# Patient Record
Sex: Female | Born: 1970 | Hispanic: No | Marital: Married | Smoking: Never smoker
Health system: Southern US, Community
[De-identification: ages and names within clinical notes are randomized; demographics above are authoritative.]

## PROBLEM LIST (undated history)

## (undated) DIAGNOSIS — T7840XA Allergy, unspecified, initial encounter: Secondary | ICD-10-CM

## (undated) DIAGNOSIS — R011 Cardiac murmur, unspecified: Secondary | ICD-10-CM

## (undated) DIAGNOSIS — I639 Cerebral infarction, unspecified: Secondary | ICD-10-CM

## (undated) DIAGNOSIS — M810 Age-related osteoporosis without current pathological fracture: Secondary | ICD-10-CM

## (undated) DIAGNOSIS — M419 Scoliosis, unspecified: Secondary | ICD-10-CM

## (undated) DIAGNOSIS — J45909 Unspecified asthma, uncomplicated: Secondary | ICD-10-CM

## (undated) DIAGNOSIS — D649 Anemia, unspecified: Secondary | ICD-10-CM

## (undated) DIAGNOSIS — D573 Sickle-cell trait: Secondary | ICD-10-CM

## (undated) DIAGNOSIS — T8859XA Other complications of anesthesia, initial encounter: Secondary | ICD-10-CM

## (undated) DIAGNOSIS — R112 Nausea with vomiting, unspecified: Secondary | ICD-10-CM

## (undated) DIAGNOSIS — E162 Hypoglycemia, unspecified: Secondary | ICD-10-CM

## (undated) DIAGNOSIS — B029 Zoster without complications: Secondary | ICD-10-CM

## (undated) DIAGNOSIS — Z8489 Family history of other specified conditions: Secondary | ICD-10-CM

## (undated) DIAGNOSIS — Z9889 Other specified postprocedural states: Secondary | ICD-10-CM

## (undated) DIAGNOSIS — R519 Headache, unspecified: Secondary | ICD-10-CM

## (undated) DIAGNOSIS — Z9289 Personal history of other medical treatment: Secondary | ICD-10-CM

## (undated) DIAGNOSIS — T4145XA Adverse effect of unspecified anesthetic, initial encounter: Secondary | ICD-10-CM

## (undated) HISTORY — DX: Sickle-cell trait: D57.3

## (undated) HISTORY — DX: Zoster without complications: B02.9

## (undated) HISTORY — DX: Personal history of other medical treatment: Z92.89

## (undated) HISTORY — DX: Allergy, unspecified, initial encounter: T78.40XA

## (undated) HISTORY — PX: CYST REMOVAL HAND: SHX6279

## (undated) HISTORY — PX: FLEXIBLE SIGMOIDOSCOPY: SHX1649

## (undated) HISTORY — DX: Unspecified asthma, uncomplicated: J45.909

## (undated) HISTORY — DX: Scoliosis, unspecified: M41.9

## (undated) HISTORY — DX: Age-related osteoporosis without current pathological fracture: M81.0

## (undated) HISTORY — DX: Cerebral infarction, unspecified: I63.9

---
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
## 2001-11-30 HISTORY — PX: BREAST EXCISIONAL BIOPSY: SUR124

## 2002-11-30 HISTORY — PX: HERNIA REPAIR: SHX51

## 2002-11-30 HISTORY — PX: BREAST SURGERY: SHX581

## 2004-09-01 ENCOUNTER — Inpatient Hospital Stay: Payer: Self-pay | Admitting: Unknown Physician Specialty

## 2005-04-13 ENCOUNTER — Ambulatory Visit: Payer: Self-pay | Admitting: General Surgery

## 2005-04-25 ENCOUNTER — Emergency Department: Payer: Self-pay | Admitting: Emergency Medicine

## 2006-12-09 ENCOUNTER — Ambulatory Visit: Payer: Self-pay

## 2007-01-23 ENCOUNTER — Emergency Department: Payer: Self-pay | Admitting: General Practice

## 2008-05-12 ENCOUNTER — Emergency Department: Payer: Self-pay | Admitting: Emergency Medicine

## 2008-11-30 HISTORY — PX: ABDOMINAL HYSTERECTOMY: SHX81

## 2009-02-08 ENCOUNTER — Emergency Department: Payer: Self-pay | Admitting: Unknown Physician Specialty

## 2009-06-26 ENCOUNTER — Ambulatory Visit: Payer: Self-pay | Admitting: Unknown Physician Specialty

## 2009-07-02 ENCOUNTER — Ambulatory Visit: Payer: Self-pay | Admitting: Unknown Physician Specialty

## 2015-11-11 ENCOUNTER — Ambulatory Visit: Payer: Self-pay | Admitting: Family Medicine

## 2015-12-05 ENCOUNTER — Ambulatory Visit: Payer: Self-pay | Admitting: Family Medicine

## 2015-12-06 ENCOUNTER — Encounter: Payer: Self-pay | Admitting: Family Medicine

## 2015-12-06 ENCOUNTER — Ambulatory Visit
Admission: RE | Admit: 2015-12-06 | Discharge: 2015-12-06 | Disposition: A | Discharge status: Home / Self Care | Payer: BLUE CROSS/BLUE SHIELD | Source: Ambulatory Visit | Attending: Family Medicine | Admitting: Family Medicine

## 2015-12-06 ENCOUNTER — Ambulatory Visit (INDEPENDENT_AMBULATORY_CARE_PROVIDER_SITE_OTHER): Payer: BLUE CROSS/BLUE SHIELD | Admitting: Family Medicine

## 2015-12-06 VITALS — BP 118/70 | HR 94 | Temp 98.3°F | Resp 16 | Ht 62.0 in | Wt 108.6 lb

## 2015-12-06 DIAGNOSIS — M79644 Pain in right finger(s): Secondary | ICD-10-CM

## 2015-12-06 MED ORDER — NAPROXEN 500 MG PO TABS: 500.0000 mg | ORAL_TABLET | Freq: Two times a day (BID) | ORAL | Status: DC

## 2015-12-06 NOTE — Progress Notes (Signed)
Name: Angela Gordon   MRN: [id number]    DOB: 04/22/1971   Date:12/06/2015       Progress Note  Subjective  Chief Complaint  Chief Complaint  Patient presents with  . Establish Care    NP    HPI Comments: Seen after the injury at [facility] and received an X ray of right hand, was told her hand 'looked good.'  Hand Injury  Incident onset: 2 weeks ago. The incident occurred at home. The injury mechanism was a fall [place] forward and tried to catch herself, landed on her outstretched hand (thumb).). The pain is present in the right fingers (right thumb). The pain is at a severity of 5/10. The pain is moderate. The pain has been improving (Last night was 8/10) since the incident. The symptoms are aggravated by palpation and movement (limited ROM, cannot grip well or twist 2/2 pain). She has tried heat, ice, acetaminophen and NSAIDs for the symptoms. The treatment provided mild relief.    Pt. Is here to establish care. Previous PCP NP Orbie Hurst and also Dr. Linton Ham at [facility].  Past Medical History  Diagnosis Date  . Osteoporosis     Strong FHx of Osteoporosis.  Marland Kitchen Scoliosis     Seen Dr. Jefm Bryant  . Stroke ([place]) 1992    Toxemia of Pregnancy  . History of blood transfusion 1992    Blood Loss from Toxemia of Pregnancy  . Sickle-cell trait ([place])   . Asthma     Uses rescue inhaler occasionally.    Past Surgical History  Procedure Laterality Date  . Tonsillectomy  1978  . Cyst removal hand Right 2005-06  . Hernia repair  2004  . Abdominal hysterectomy  2010    Menopausal symptoms, takes HRT    Family History  Problem Relation Age of Onset  . Hypertension Mother   . Osteoporosis Mother   . Cervical cancer Mother     Hysterectomy in 90s.  . Sickle cell trait Father   . Sickle cell trait Sister     Social History   Social History  . Marital Status: Married    Spouse Name: N/A  . Number of Children: N/A  . Years of Education: N/A   Occupational History   . Not on file.   Social History Main Topics  . Smoking status: Never Smoker   . Smokeless tobacco: Never Used  . Alcohol Use: 1.2 oz/week    2 Glasses of wine per week     Comment: occasional  . Drug Use: No  . Sexual Activity: Yes   Other Topics Concern  . Not on file   Social History Narrative  . No narrative on file    Current outpatient prescriptions:  .  albuterol (PROVENTIL) (5 MG/ML) 0.5% nebulizer solution, Take 2.5 mg by nebulization as needed for wheezing or shortness of breath., Disp: , Rfl:   Allergies  Allergen Reactions  . Penicillins Rash  . 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  Musculoskeletal: Positive for myalgias and joint pain.   Objective  Filed Vitals:   12/06/15 0850  BP: 118/70  Pulse: 94  Temp: 98.3 F (36.8 C)  TempSrc: Oral  Resp: 16  Height: 5\' 2"  (1.575 m)  Weight: 108 lb 9.6 oz (49.261 kg)  SpO2: 94%    Physical Exam  Constitutional: She is well-developed, well-nourished, and in no distress.  Musculoskeletal:       Right hand: She exhibits tenderness. She exhibits no deformity, no laceration and no swelling.  Tenderness to palpation over the proximal IP joint, limited ROM,   Nursing note and vitals reviewed.   Assessment & Plan  1. Pain of right thumb Obtain x-ray to rule out fracture. If x-ray negative, we will consider referral to hand specialist. Started on high-dose NSAID therapy for pain relief - DG Hand Complete Right; Future - naproxen (NAPROSYN) 500 MG tablet; Take 1 tablet (500 mg total) by mouth 2 (two) times daily with a meal.  Dispense: 30 tablet; Refill: 0   Marvens Hollars [facility] 12/06/2015 9:18 AM

## 2015-12-18 ENCOUNTER — Other Ambulatory Visit: Payer: Self-pay | Admitting: Family Medicine

## 2016-01-07 ENCOUNTER — Ambulatory Visit (INDEPENDENT_AMBULATORY_CARE_PROVIDER_SITE_OTHER): Payer: BLUE CROSS/BLUE SHIELD | Admitting: Family Medicine

## 2016-01-07 ENCOUNTER — Encounter: Payer: Self-pay | Admitting: Family Medicine

## 2016-01-07 VITALS — BP 118/73 | HR 96 | Temp 98.6°F | Resp 20 | Ht 62.0 in | Wt 104.9 lb

## 2016-01-07 DIAGNOSIS — M810 Age-related osteoporosis without current pathological fracture: Secondary | ICD-10-CM

## 2016-01-07 DIAGNOSIS — Z0000 Encounter for general adult medical examination without abnormal findings: Secondary | ICD-10-CM

## 2016-01-07 NOTE — Progress Notes (Signed)
Name: Angela Gordon   MRN: [id number]    DOB: 07/28/1971   Date:01/07/2016       Progress Note  Subjective  Chief Complaint  Chief Complaint  Patient presents with  . Annual Exam    CPE    HPI  Pt. Is here for a Complete Physical Exam. She has her Gyn exam and screening at [facility]. She is doing well.  Past Medical History  Diagnosis Date  . Osteoporosis     Strong FHx of Osteoporosis.  Marland Kitchen Scoliosis     Seen Dr. Jefm Bryant  . Stroke ([place]) 1992    Toxemia of Pregnancy  . History of blood transfusion 1992    Blood Loss from Toxemia of Pregnancy  . Sickle-cell trait ([place])   . Asthma     Uses rescue inhaler occasionally.    Past Surgical History  Procedure Laterality Date  . Tonsillectomy  1978  . Cyst removal hand Right 2005-06  . Hernia repair  2004  . Abdominal hysterectomy  2010    Menopausal symptoms, takes HRT    Family History  Problem Relation Age of Onset  . Hypertension Mother   . Osteoporosis Mother   . Cervical cancer Mother     Hysterectomy in 76s.  . Sickle cell trait Father   . Sickle cell trait Sister     Social History   Social History  . Marital Status: Married    Spouse Name: N/A  . Number of Children: N/A  . Years of Education: N/A   Occupational History  . Not on file.   Social History Main Topics  . Smoking status: Never Smoker   . Smokeless tobacco: Never Used  . Alcohol Use: 1.2 oz/week    2 Glasses of wine per week     Comment: occasional  . Drug Use: No  . Sexual Activity: Yes   Other Topics Concern  . Not on file   Social History Narrative     Current outpatient prescriptions:  .  albuterol (PROVENTIL) (5 MG/ML) 0.5% nebulizer solution, Take 2.5 mg by nebulization as needed for wheezing or shortness of breath., Disp: , Rfl:  .  naproxen (NAPROSYN) 500 MG tablet, TAKE 1 TABLET (500 MG TOTAL) BY MOUTH 2 (TWO) TIMES DAILY WITH A MEAL., Disp: 30 tablet, Rfl: 0  Allergies  Allergen Reactions  . Penicillins Rash   . 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 Negative for fever, chills and weight loss.  HENT: Negative for congestion and sore throat.   Eyes: Negative for blurred vision and double vision.  Respiratory: Negative for cough and shortness of breath.   Cardiovascular: Negative for chest pain and leg swelling.  Gastrointestinal: Negative for heartburn, nausea, vomiting, abdominal pain, diarrhea, constipation, blood in stool and melena.  Genitourinary: Negative for dysuria and urgency.  Musculoskeletal: Positive for myalgias ('my whole body aches' Has been diagnosed with Osteoporosis based on DEXA scan 2-3 yrs ago, was not started on any therapy) and back pain. Negative for neck pain.  Skin: Negative for rash.  Neurological: Negative for headaches.  Psychiatric/Behavioral: Negative for depression. The patient is not nervous/anxious and does not have insomnia.     Objective  Filed Vitals:   01/07/16 0917  BP: 118/73  Pulse: 96  Temp: 98.6 F (37 C)  TempSrc: Oral  Resp: 20  Height: 5\' 2"  (1.575 m)  Weight: 104 lb 14.4 oz (47.582 kg)  SpO2: 98%    Physical Exam  Constitutional: She is oriented to person, place, and time and well-developed, well-nourished, and in no distress.  HENT:  Head: Normocephalic and atraumatic.  Mouth/Throat: No posterior oropharyngeal erythema.  Eyes: Pupils are equal, round, and reactive to light.  Neck: Normal range of motion.  Cardiovascular: Normal rate, regular rhythm and normal heart sounds.   Pulmonary/Chest: Effort normal and breath sounds normal. She has no wheezes.  Abdominal: Soft. Bowel sounds are normal. There is no tenderness.  Genitourinary:  deferred  Musculoskeletal: She exhibits no edema.  Neurological: She is alert and oriented to person, place, and time.  Nursing note and vitals reviewed.    Assessment & Plan  1. Encounter for annual  physical exam  Obtain baseline labs. Patient follows with  Gynecology for preventative screenings.  - CBC with Differential - Comprehensive Metabolic Panel (CMET) - Lipid Profile - TSH - Vitamin D (25 hydroxy)   Jamaia Brum [facility] 01/07/2016 9:31 AM

## 2016-01-08 LAB — CBC AND DIFFERENTIAL
HEMOGLOBIN: 13.9 g/dL (ref 12.0–16.0)
Platelets: 210 10*3/uL (ref 150–399)
WBC: 5.6 10^3/mL

## 2016-01-08 LAB — HEPATIC FUNCTION PANEL
ALT: 16 U/L (ref 7–35)
AST: 18 U/L (ref 13–35)
Alkaline Phosphatase: 47 U/L (ref 25–125)
Bilirubin, Total: 1.5 mg/dL

## 2016-01-08 LAB — BASIC METABOLIC PANEL
BUN: 11 mg/dL (ref 4–21)
Creatinine: 0.8 mg/dL (ref 0.5–1.1)
Glucose: 81 mg/dL
POTASSIUM: 4.1 mmol/L (ref 3.4–5.3)
SODIUM: 140 mmol/L (ref 137–147)

## 2016-01-08 LAB — LIPID PANEL
CHOLESTEROL: 143 mg/dL (ref 0–200)
HDL: 59 mg/dL (ref 35–70)
LDL CALC: 71 mg/dL
TRIGLYCERIDES: 64 mg/dL (ref 40–160)

## 2016-01-20 ENCOUNTER — Encounter: Payer: Self-pay | Admitting: Family Medicine

## 2016-01-20 ENCOUNTER — Ambulatory Visit (INDEPENDENT_AMBULATORY_CARE_PROVIDER_SITE_OTHER): Payer: BLUE CROSS/BLUE SHIELD | Admitting: Family Medicine

## 2016-01-20 VITALS — BP 102/78 | HR 99 | Temp 98.2°F | Resp 14 | Ht 62.0 in | Wt 106.0 lb

## 2016-01-20 DIAGNOSIS — M810 Age-related osteoporosis without current pathological fracture: Secondary | ICD-10-CM | POA: Diagnosis not present

## 2016-01-20 NOTE — Progress Notes (Signed)
Name: Angela Gordon   MRN: [id number]    DOB: 22-Mar-1971   Date:01/20/2016       Progress Note  Subjective  Chief Complaint  Chief Complaint  Patient presents with  . Osteoporosis    Discuss bone density    HPI  Pt. Is here to discuss the DEXA Scan obtained in March 2014, which showed Osteoporosis in her lumbar spine with a T-score of -3.2. She was told by Dr. Cristi Loron that she should not be on medication because she was 'too young' to receive any therapy.   Past Medical History  Diagnosis Date  . Osteoporosis     Strong FHx of Osteoporosis.  Marland Kitchen Scoliosis     Seen Dr. Jefm Bryant  . Stroke ([place]) 1992    Toxemia of Pregnancy  . History of blood transfusion 1992    Blood Loss from Toxemia of Pregnancy  . Sickle-cell trait ([place])   . Asthma     Uses rescue inhaler occasionally.    Past Surgical History  Procedure Laterality Date  . Tonsillectomy  1978  . Cyst removal hand Right 2005-06  . Hernia repair  2004  . Abdominal hysterectomy  2010    Menopausal symptoms, takes HRT    Family History  Problem Relation Age of Onset  . Hypertension Mother   . Osteoporosis Mother   . Cervical cancer Mother     Hysterectomy in 74s.  . Sickle cell trait Father   . Sickle cell trait Sister     Social History   Social History  . Marital Status: Married    Spouse Name: N/A  . Number of Children: N/A  . Years of Education: N/A   Occupational History  . Not on file.   Social History Main Topics  . Smoking status: Never Smoker   . Smokeless tobacco: Never Used  . Alcohol Use: 1.2 oz/week    2 Glasses of wine per week     Comment: occasional  . Drug Use: No  . Sexual Activity: Yes   Other Topics Concern  . Not on file   Social History Narrative     Current outpatient prescriptions:  .  albuterol (PROVENTIL) (5 MG/ML) 0.5% nebulizer solution, Take 2.5 mg by nebulization as needed for wheezing or shortness of breath., Disp: , Rfl:  .  naproxen (NAPROSYN) 500  MG tablet, TAKE 1 TABLET (500 MG TOTAL) BY MOUTH 2 (TWO) TIMES DAILY WITH A MEAL., Disp: 30 tablet, Rfl: 0  Allergies  Allergen Reactions  . Penicillins Rash  . Scallops [Shellfish Allergy] Anaphylaxis     Review of Systems  Musculoskeletal: Positive for back pain.    Objective  Filed Vitals:   01/20/16 1132  BP: 102/78  Pulse: 99  Temp: 98.2 F (36.8 C)  TempSrc: Oral  Resp: 14  Height: 5\' 2"  (1.575 m)  Weight: 106 lb (48.081 kg)  SpO2: 98%    Physical Exam  Constitutional: She is oriented to person, place, and time and well-developed, well-nourished, and in no distress.  Cardiovascular: Normal rate and regular rhythm.   Pulmonary/Chest: Effort normal and breath sounds normal.  Neurological: She is alert and oriented to person, place, and time.  Nursing note and vitals reviewed.      Assessment & Plan  1. Osteoporosis Based on patient's history and presentation, no contraindication exists for bisphosphonate therapy if she has osteoporosis. We'll repeat DEXA scan and follow-up. - DG Bone Density; Future   Arriyah Madej [facility] 01/20/2016 11:46 AM

## 2016-02-06 ENCOUNTER — Encounter: Payer: Self-pay | Admitting: Family Medicine

## 2016-02-06 ENCOUNTER — Telehealth: Payer: Self-pay

## 2016-02-06 ENCOUNTER — Other Ambulatory Visit: Payer: Self-pay

## 2016-02-06 ENCOUNTER — Other Ambulatory Visit: Payer: Self-pay | Admitting: Family Medicine

## 2016-02-06 MED ORDER — VITAMIN D (ERGOCALCIFEROL) 1.25 MG (50000 UNIT) PO CAPS: 50000.0000 [IU] | ORAL_CAPSULE | ORAL | Status: DC

## 2016-02-06 NOTE — Telephone Encounter (Signed)
Pt informed of recent lab results. Pt will return in 2 weeks to pick up lab slip for repeat CMP and return in 12 weeks for repeat Vit D lab. Also sent scrip for Vit D 50,000 units to pharmacy

## 2016-05-05 ENCOUNTER — Telehealth: Payer: Self-pay | Admitting: Family Medicine

## 2016-05-05 NOTE — Telephone Encounter (Signed)
Bone density scan or DEXA scan was ordered at the time of office visit on 01/20/2016. Please process the order so that patient can receive her scan as soon as possible. We will repeat  vitamin D levels at her follow-up appointment

## 2016-05-05 NOTE — Telephone Encounter (Signed)
According to your notes you wanted patient to return in 12weeks to redo her vitamin d levels. I scheduled appointment for her later this week. She never received a call about her done density report. Please return call. She also wanted to know if you wanted to refill her vitamin d prescription.

## 2016-05-06 NOTE — Telephone Encounter (Signed)
We'll review  DEXA scan results at the time of her office visit appointment.

## 2016-05-06 NOTE — Telephone Encounter (Signed)
Patient did the bone density and when I told her that the only one we had on file was from 2014 and said she would go by the place and try to pick up a copy of her results.

## 2016-05-07 ENCOUNTER — Ambulatory Visit (INDEPENDENT_AMBULATORY_CARE_PROVIDER_SITE_OTHER): Payer: BLUE CROSS/BLUE SHIELD | Admitting: Family Medicine

## 2016-05-07 ENCOUNTER — Encounter: Payer: Self-pay | Admitting: Family Medicine

## 2016-05-07 VITALS — BP 100/74 | HR 82 | Temp 98.5°F | Resp 17 | Ht 62.0 in | Wt 109.1 lb

## 2016-05-07 DIAGNOSIS — M810 Age-related osteoporosis without current pathological fracture: Secondary | ICD-10-CM

## 2016-05-07 DIAGNOSIS — D573 Sickle-cell trait: Secondary | ICD-10-CM

## 2016-05-07 DIAGNOSIS — E559 Vitamin D deficiency, unspecified: Secondary | ICD-10-CM | POA: Insufficient documentation

## 2016-05-07 MED ORDER — ALENDRONATE-CHOLECALCIFEROL 70-2800 MG-UNIT PO TABS: 1.0000 | ORAL_TABLET | ORAL | Status: DC

## 2016-05-07 NOTE — Progress Notes (Signed)
Name: Angela Gordon   MRN: [id number]    DOB: 1971-02-15   Date:05/07/2016       Progress Note  Subjective  Chief Complaint  Chief Complaint  Patient presents with  . Follow-up    Check vitamin D level    HPI  Osteoporosis: Pt. Has Osteoporosis of Lumbar spine, DEXA Scan obtained in March 2017 shows Z-score of -2.7 at the L-Spine consistent with Osteoporosis. In addition, her Z-score at the proximal left femur is -1.3, consistent with osteopenia. She reports chronic middle and low back pain, worse with activity and certain positions. Takes Aleve/Tylenol as needed for pain. Has hysterectomy w/ bilateral oophorectomy in September 2010 for strong family Hx of Uterine cancer.  Vitamin D Deficiency: Vitamin D deficiency, most recent level of 15 ng/mL in February 2017, she has taken Vitamin D 50,000 units q weekly. Will repeat Vitamin D levels.   Past Medical History  Diagnosis Date  . Osteoporosis     Strong FHx of Osteoporosis.  Marland Kitchen Scoliosis     Seen Dr. Jefm Bryant  . Stroke ([place]) 1992    Toxemia of Pregnancy  . History of blood transfusion 1992    Blood Loss from Toxemia of Pregnancy  . Sickle-cell trait ([place])   . Asthma     Uses rescue inhaler occasionally.    Past Surgical History  Procedure Laterality Date  . Tonsillectomy  1978  . Cyst removal hand Right 2005-06  . Hernia repair  2004  . Abdominal hysterectomy  2010    Menopausal symptoms, takes HRT    Family History  Problem Relation Age of Onset  . Hypertension Mother   . Osteoporosis Mother   . Cervical cancer Mother     Hysterectomy in 62s.  . Sickle cell trait Father   . Sickle cell trait Sister     Social History   Social History  . Marital Status: Married    Spouse Name: N/A  . Number of Children: N/A  . Years of Education: N/A   Occupational History  . Not on file.   Social History Main Topics  . Smoking status: Never Smoker   . Smokeless tobacco: Never Used  . Alcohol Use: 1.2 oz/week     2 Glasses of wine per week     Comment: occasional  . Drug Use: No  . Sexual Activity: Yes   Other Topics Concern  . Not on file   Social History Narrative     Current outpatient prescriptions:  .  albuterol (PROVENTIL) (5 MG/ML) 0.5% nebulizer solution, Take 2.5 mg by nebulization as needed for wheezing or shortness of breath., Disp: , Rfl:  .  alendronate-cholecalciferol (FOSAMAX PLUS D) 70-2800 MG-UNIT tablet, Take 1 tablet by mouth every 7 (seven) days. Take with a full glass of water on an empty stomach., Disp: 52 tablet, Rfl: 0 .  naproxen (NAPROSYN) 500 MG tablet, TAKE 1 TABLET (500 MG TOTAL) BY MOUTH 2 (TWO) TIMES DAILY WITH A MEAL., Disp: 30 tablet, Rfl: 0 .  Vitamin D, Ergocalciferol, (DRISDOL) 50000 units CAPS capsule, Take 1 capsule (50,000 Units total) by mouth every 7 (seven) days. (Patient not taking: Reported on 05/07/2016), Disp: 12 capsule, Rfl: 0  Allergies  Allergen Reactions  . Penicillins Rash  . 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  Cardiovascular: Negative for chest pain.  Gastrointestinal: Negative for heartburn, nausea, vomiting and abdominal pain.  Musculoskeletal: Positive for back pain  and joint pain.    Objective  Filed Vitals:   05/07/16 1007  BP: 100/74  Pulse: 82  Temp: 98.5 F (36.9 C)  TempSrc: Oral  Resp: 17  Height: 5\' 2"  (1.575 m)  Weight: 109 lb 1.6 oz (49.487 kg)  SpO2: 99%    Physical Exam  Constitutional: She is well-developed, well-nourished, and in no distress.  Cardiovascular: Normal rate, regular rhythm and normal heart sounds.   No murmur heard. Pulmonary/Chest: Effort normal and breath sounds normal. She has no wheezes.  Musculoskeletal:       Thoracic back: She exhibits tenderness and pain.       Lumbar back: She exhibits tenderness and pain.  Mild tenderness to palpation over the left lower spine level.  Nursing note and vitals  reviewed.    Assessment & Plan  1. Osteoporosis, post-menopausal We'll start on Fosamax plus vitamin D for 1 year. Educated on potential adverse effects, advised on taking medication first thing in the morning, sitting upright for 30 minutes. - alendronate-cholecalciferol (FOSAMAX PLUS D) 70-2800 MG-UNIT tablet; Take 1 tablet by mouth every 7 (seven) days. Take with a full glass of water on an empty stomach.  Dispense: 52 tablet; Refill: 0 - Comprehensive Metabolic Panel (CMET)  2. Vitamin D deficiency  - Vitamin D (25 hydroxy)  3. Sickle-cell trait ([place])   4. Hyperbilirubinemia Likely a component of RBC breakdown from sickle cell trait. Repeat bilirubin levels - Comprehensive Metabolic Panel (CMET)    Semaja Lymon [facility] 05/07/2016 10:34 AM

## 2016-05-08 ENCOUNTER — Encounter: Payer: Self-pay | Admitting: Family Medicine

## 2016-08-10 ENCOUNTER — Encounter: Payer: Self-pay | Admitting: Family Medicine

## 2016-08-10 ENCOUNTER — Ambulatory Visit (INDEPENDENT_AMBULATORY_CARE_PROVIDER_SITE_OTHER): Payer: BLUE CROSS/BLUE SHIELD | Admitting: Family Medicine

## 2016-08-10 VITALS — BP 112/68 | HR 96 | Temp 97.8°F | Resp 16 | Ht 62.0 in | Wt 111.1 lb

## 2016-08-10 DIAGNOSIS — E559 Vitamin D deficiency, unspecified: Secondary | ICD-10-CM

## 2016-08-10 DIAGNOSIS — M810 Age-related osteoporosis without current pathological fracture: Secondary | ICD-10-CM | POA: Diagnosis not present

## 2016-08-10 NOTE — Progress Notes (Signed)
Name: Angela Gordon   MRN: [id number]    DOB: [birth year]   Date:08/10/2016       Progress Note  Subjective  Chief Complaint  Chief Complaint  Patient presents with  . Follow-up    3 month vitamin D deficiency    HPI  Pt. Presents for vitamin D deficiency, last levels obtained in March 2017 showed her vitamin D level to be 15 ng/mL, she has previously taken Vitamin D 50,000 units prior to lab work. She has osteoporosis of the lumbar spine, osteopenia of femoral neck. She has been on Fosamax plus D, taking without any problems.  Past Medical History:  Diagnosis Date  . Asthma    Uses rescue inhaler occasionally.  Marland Kitchen History of blood transfusion 1992   Blood Loss from Toxemia of Pregnancy  . Osteoporosis    Strong FHx of Osteoporosis.  Marland Kitchen Scoliosis    Seen Dr. Jefm Gordon  . Sickle-cell trait ([place])   . Stroke [facility]) 1992   Toxemia of Pregnancy    Past Surgical History:  Procedure Laterality Date  . ABDOMINAL HYSTERECTOMY  2010   Menopausal symptoms, takes HRT  . CYST REMOVAL HAND Right 2005-06  . HERNIA REPAIR  2004  . TONSILLECTOMY  1978    Family History  Problem Relation Age of Onset  . Hypertension Mother   . Osteoporosis Mother   . Cervical cancer Mother     Hysterectomy in 54s.  . Sickle cell trait Father   . Sickle cell trait Sister     Social History   Social History  . Marital status: Married    Spouse name: N/A  . Number of children: N/A  . Years of education: N/A   Occupational History  . Not on file.   Social History Main Topics  . Smoking status: Never Smoker  . Smokeless tobacco: Never Used  . Alcohol use 1.2 oz/week    2 Glasses of wine per week     Comment: occasional  . Drug use: No  . Sexual activity: Yes   Other Topics Concern  . Not on file   Social History Narrative  . No narrative on file     Current Outpatient Prescriptions:  .  albuterol (PROVENTIL) (5 MG/ML) 0.5% nebulizer solution, Take 2.5 mg by nebulization as  needed for wheezing or shortness of breath., Disp: , Rfl:  .  alendronate-cholecalciferol (FOSAMAX PLUS D) 70-2800 MG-UNIT tablet, Take 1 tablet by mouth every 7 (seven) days. Take with a full glass of water on an empty stomach., Disp: 52 tablet, Rfl: 0 .  naproxen (NAPROSYN) 500 MG tablet, TAKE 1 TABLET (500 MG TOTAL) BY MOUTH 2 (TWO) TIMES DAILY WITH A MEAL., Disp: 30 tablet, Rfl: 0  Allergies  Allergen Reactions  . Penicillins Rash  . 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  Musculoskeletal: Positive for back pain and joint pain.     Objective  Vitals:   08/10/16 0939  BP: 112/68  Pulse: 96  Resp: 16  Temp: 97.8 F (36.6 C)  TempSrc: Oral  SpO2: (!) 62%  Weight: 111 lb 1.6 oz (50.4 kg)  Height: 5\' 2"  (1.575 m)    Physical Exam  Constitutional: She is oriented to person, place, and time and well-developed, well-nourished, and in no distress.  HENT:  Head: Normocephalic and atraumatic.  Cardiovascular: Normal rate, regular rhythm and normal heart sounds.   No murmur heard. Pulmonary/Chest: Effort normal  and breath sounds normal. She has no wheezes.  Musculoskeletal:       Lumbar back: Normal. She exhibits no tenderness, no pain and no spasm.  Neurological: She is alert and oriented to person, place, and time.  Skin: Skin is warm and dry.  Psychiatric: Mood, memory, affect and judgment normal.  Nursing note and vitals reviewed.   Assessment & Plan  1. Osteoporosis, post-menopausal DEXA scan report reviewed, patient on Fosamax Plus D  2. Vitamin D deficiency Recheck vitamin D levels, she is on 2800 units of vitamin D weekly with Fosamax. - Vitamin D (25 hydroxy)  Angela Gordon [facility] 08/10/2016 10:07 AM

## 2016-08-25 ENCOUNTER — Emergency Department: Payer: BLUE CROSS/BLUE SHIELD

## 2016-08-25 ENCOUNTER — Encounter: Payer: Self-pay | Admitting: Emergency Medicine

## 2016-08-25 ENCOUNTER — Emergency Department
Admission: EM | Admit: 2016-08-25 | Discharge: 2016-08-25 | Disposition: A | Discharge status: Home / Self Care | Payer: BLUE CROSS/BLUE SHIELD | Attending: Emergency Medicine | Admitting: Emergency Medicine

## 2016-08-25 DIAGNOSIS — Y999 Unspecified external cause status: Secondary | ICD-10-CM | POA: Insufficient documentation

## 2016-08-25 DIAGNOSIS — S90122A Contusion of left lesser toe(s) without damage to nail, initial encounter: Secondary | ICD-10-CM | POA: Insufficient documentation

## 2016-08-25 DIAGNOSIS — Y929 Unspecified place or not applicable: Secondary | ICD-10-CM | POA: Diagnosis not present

## 2016-08-25 DIAGNOSIS — Y939 Activity, unspecified: Secondary | ICD-10-CM | POA: Diagnosis not present

## 2016-08-25 DIAGNOSIS — J45909 Unspecified asthma, uncomplicated: Secondary | ICD-10-CM | POA: Insufficient documentation

## 2016-08-25 DIAGNOSIS — S9032XA Contusion of left foot, initial encounter: Secondary | ICD-10-CM

## 2016-08-25 DIAGNOSIS — W228XXA Striking against or struck by other objects, initial encounter: Secondary | ICD-10-CM | POA: Diagnosis not present

## 2016-08-25 DIAGNOSIS — S99922A Unspecified injury of left foot, initial encounter: Secondary | ICD-10-CM | POA: Diagnosis present

## 2016-08-25 MED ORDER — TRAMADOL HCL 50 MG PO TABS: 50.0000 mg | ORAL_TABLET | Freq: Four times a day (QID) | ORAL | 0 refills | Status: DC | PRN

## 2016-08-25 MED ORDER — NAPROXEN 500 MG PO TABS: 500.0000 mg | ORAL_TABLET | Freq: Two times a day (BID) | ORAL | Status: DC

## 2016-08-25 NOTE — ED Provider Notes (Signed)
[facility] Emergency Department Provider Note   ____________________________________________   None    (approximate)  I have reviewed the triage vital signs and the nursing notes.  HISTORY  Chief Complaint Toe Pain    HPI Angela Gordon is a 45 y.o. female patient complaining of pain and edema to the left fifth toe 4 days. Patient states she was hit a chair while she was trying to help her husband. Patient stated pain is increased and she is unable to bear weight secondary to the pain. No palliative measures taken for this complaint.Patient rates the pain as 8/10. Patient described a pain as "sharp".   Past Medical History:  Diagnosis Date  . Asthma    Uses rescue inhaler occasionally.  Marland Kitchen History of blood transfusion 1992   Blood Loss from Toxemia of Pregnancy  . Osteoporosis    Strong FHx of Osteoporosis.  Marland Kitchen Scoliosis    Seen Dr. Jefm Bryant  . Sickle-cell trait ([place])   . Stroke [facility]) 1992   Toxemia of Pregnancy    Patient Active Problem List   Diagnosis Date Noted  . Vitamin D deficiency 05/07/2016  . Sickle-cell trait ([place]) 05/07/2016  . Hyperbilirubinemia 05/07/2016  . Encounter for annual physical exam 01/07/2016  . 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 Pain of right thumb 12/06/2015    Past Surgical History:  Procedure Laterality Date  . ABDOMINAL HYSTERECTOMY  2010   Menopausal symptoms, takes HRT  . CYST REMOVAL HAND Right 2005-06  . HERNIA REPAIR  2004  . TONSILLECTOMY  1978    Prior to Admission medications   Medication Sig Start Date End Date Taking? Authorizing Provider  albuterol (PROVENTIL) (5 MG/ML) 0.5% nebulizer solution Take 2.5 mg by nebulization as needed for wheezing or shortness of breath.    Historical Provider, MD  alendronate-cholecalciferol (FOSAMAX PLUS D) 70-2800 MG-UNIT tablet Take 1 tablet by mouth every 7 (seven) days. Take with a full glass of water on an empty stomach. 05/07/16   Roselee Nova, MD  naproxen (NAPROSYN) 500 MG tablet TAKE 1 TABLET (500 MG TOTAL) BY MOUTH 2 (TWO) TIMES DAILY WITH A MEAL. 12/19/15   Roselee Nova, MD  naproxen (NAPROSYN) 500 MG tablet Take 1 tablet (500 mg total) by mouth 2 (two) times daily with a meal. 08/25/16   Sable Feil, [name]  traMADol (ULTRAM) 50 MG tablet Take 1 tablet (50 mg total) by mouth every 6 (six) hours as needed for moderate pain. 08/25/16   Sable Feil, [name]    Allergies Penicillins and Scallops [shellfish allergy]  Family History  Problem Relation Age of Onset  . Hypertension Mother   . Osteoporosis Mother   . Cervical cancer Mother     Hysterectomy in 32s.  . Sickle cell trait Father   . Sickle cell trait Sister     Social History Social History  Substance Use Topics  . Smoking status: Never Smoker  . Smokeless tobacco: Never Used  . Alcohol use 1.2 oz/week    2 Glasses of wine per week     Comment: occasional    Review of Systems Constitutional: No fever/chills Eyes: No visual changes. ENT: No sore throat. Cardiovascular: Denies chest pain. Respiratory: Denies shortness of breath. Gastrointestinal: No abdominal pain.  No nausea, no vomiting.  No diarrhea.  No constipation. Genitourinary: Negative for dysuria. Musculoskeletal: Negative for back pain. Skin: Negative for rash. Neurological: Negative for headaches, focal weakness or numbness. Hematological/Lymphatic:Sickle cell trait  and vitamin D deficiency. Allergic/Immunilogical: See medication list ________________________________________   PHYSICAL EXAM:  VITAL SIGNS: ED Triage Vitals [08/25/16 0836]  Enc Vitals Group     BP 109/73     Pulse Rate 83     Resp 20     Temp 98.1 F (36.7 C)     Temp Source Oral     SpO2 100 %     Weight 111 lb (50.3 kg)     Height 5\' 2"  (1.575 m)     Head Circumference      Peak Flow      Pain Score      Pain Loc      Pain Edu?      Excl. in [place]?     Constitutional: Alert and oriented. Well  appearing and in no acute distress. Eyes: Conjunctivae are normal. PERRL. EOMI. Head: Atraumatic. Nose: No congestion/rhinnorhea. Mouth/Throat: Mucous membranes are moist.  Oropharynx non-erythematous. Neck: No stridor.  No cervical spine tenderness to palpation. Hematological/Lymphatic/Immunilogical: No cervical lymphadenopathy. Cardiovascular: Normal rate, regular rhythm. Grossly normal heart sounds.  Good peripheral circulation. Respiratory: Normal respiratory effort.  No retractions. Lungs CTAB. Gastrointestinal: Soft and nontender. No distention. No abdominal bruits. No CVA tenderness. Musculoskeletal: No obvious deformity to the fifth digit left foot. There is moderate edema and ecchymosis. Patient has moderate guarding palpation of the metatarsal head.  Neurologic:  Normal speech and language. No gross focal neurologic deficits are appreciated. No gait instability. Skin:  Skin is warm, dry and intact. No rash noted. Psychiatric: Mood and affect are normal. Speech and behavior are normal.  ____________________________________________   LABS (all labs ordered are listed, but only abnormal results are displayed)  Labs Reviewed - No data to display ____________________________________________  EKG   ____________________________________________  RADIOLOGY  No acute findings on x-ray. Soft tissue edema is apparent. ____________________________________________   PROCEDURES  Procedure(s) performed: None  Procedures  Critical Care performed: No  ____________________________________________   INITIAL IMPRESSION / ASSESSMENT AND PLAN / ED COURSE  Pertinent labs & imaging results that were available during my care of the patient were reviewed by me and considered in my medical decision making (see chart for details).  Left foot contusion. Discussed x-ray finding with patient. Patient given discharge care instructions. Patient given open shoe for comfort. Patient given a  prescription for naproxen and tramadol. Patient advised to follow-up with family doctor condition persists.  Clinical Course     ____________________________________________   FINAL CLINICAL IMPRESSION(S) / ED DIAGNOSES  Final diagnoses:  Foot contusion, left, initial encounter      NEW MEDICATIONS STARTED DURING THIS VISIT:  New Prescriptions   NAPROXEN (NAPROSYN) 500 MG TABLET    Take 1 tablet (500 mg total) by mouth 2 (two) times daily with a meal.   TRAMADOL (ULTRAM) 50 MG TABLET    Take 1 tablet (50 mg total) by mouth every 6 (six) hours as needed for moderate pain.     Note:  This document was prepared using Dragon voice recognition software and may include unintentional dictation errors.    Sable Feil, [name] 08/25/16 [id number]    Harvest Dark, MD 08/25/16 [id number]

## 2016-08-25 NOTE — ED Triage Notes (Signed)
States she hit her left 5th toe on chair on Friday  conts to have pain unable to bear full wt

## 2016-09-02 ENCOUNTER — Encounter: Payer: Self-pay | Admitting: Family Medicine

## 2016-09-02 ENCOUNTER — Ambulatory Visit (INDEPENDENT_AMBULATORY_CARE_PROVIDER_SITE_OTHER): Payer: BLUE CROSS/BLUE SHIELD | Admitting: Family Medicine

## 2016-09-02 VITALS — BP 124/76 | HR 75 | Temp 98.0°F | Resp 16 | Ht 62.0 in | Wt 112.2 lb

## 2016-09-02 DIAGNOSIS — S90122D Contusion of left lesser toe(s) without damage to nail, subsequent encounter: Secondary | ICD-10-CM

## 2016-09-02 NOTE — Progress Notes (Signed)
Name: Angela Gordon   MRN: [id number]    DOB: [birth year]   Date:09/02/2016       Progress Note  Subjective  Chief Complaint  Chief Complaint  Patient presents with  . Toe Injury    seen in ER 9/23, hit small left toe on chair, painful    HPI  Pt. Presents for evaluation of injury to left 5th toe, her toe hit a wooden chair on the evening of Saturday, September 23rd,  started to swell and was seen in the ER. X rays showed no fracture but soft tissue swelling. She was prescribed NSAIDS, put in a special shoe and asked to follow up. She has been wearing the shoe and swelling has gone down but still is in a lot of pain especially with walking and weight bearing.    Past Medical History:  Diagnosis Date  . Asthma    Uses rescue inhaler occasionally.  Marland Kitchen History of blood transfusion 1992   Blood Loss from Toxemia of Pregnancy  . Osteoporosis    Strong FHx of Osteoporosis.  Marland Kitchen Scoliosis    Seen Dr. Jefm Bryant  . Sickle-cell trait ([place])   . Stroke [facility]) 1992   Toxemia of Pregnancy    Past Surgical History:  Procedure Laterality Date  . ABDOMINAL HYSTERECTOMY  2010   Menopausal symptoms, takes HRT  . CYST REMOVAL HAND Right 2005-06  . HERNIA REPAIR  2004  . TONSILLECTOMY  1978    Family History  Problem Relation Age of Onset  . Hypertension Mother   . Osteoporosis Mother   . Cervical cancer Mother     Hysterectomy in 33s.  . Sickle cell trait Father   . Sickle cell trait Sister     Social History   Social History  . Marital status: Married    Spouse name: N/A  . Number of children: N/A  . Years of education: N/A   Occupational History  . Not on file.   Social History Main Topics  . Smoking status: Never Smoker  . Smokeless tobacco: Never Used  . Alcohol use 1.2 oz/week    2 Glasses of wine per week     Comment: occasional  . Drug use: No  . Sexual activity: Yes   Other Topics Concern  . Not on file   Social History Narrative  . No narrative on file      Current Outpatient Prescriptions:  .  albuterol (PROVENTIL) (5 MG/ML) 0.5% nebulizer solution, Take 2.5 mg by nebulization as needed for wheezing or shortness of breath., Disp: , Rfl:  .  alendronate-cholecalciferol (FOSAMAX PLUS D) 70-2800 MG-UNIT tablet, Take 1 tablet by mouth every 7 (seven) days. Take with a full glass of water on an empty stomach., Disp: 52 tablet, Rfl: 0 .  naproxen (NAPROSYN) 500 MG tablet, TAKE 1 TABLET (500 MG TOTAL) BY MOUTH 2 (TWO) TIMES DAILY WITH A MEAL., Disp: 30 tablet, Rfl: 0 .  traMADol (ULTRAM) 50 MG tablet, Take 1 tablet (50 mg total) by mouth every 6 (six) hours as needed for moderate pain., Disp: 12 tablet, Rfl: 0  Allergies  Allergen Reactions  . Penicillins Rash  . Scallops [Shellfish Allergy] Anaphylaxis     Review of Systems  Constitutional: Negative for chills, fever and malaise/fatigue.  Musculoskeletal: Positive for joint pain.    Objective  Vitals:   09/02/16 0830  BP: 124/76  Pulse: 75  Resp: 16  Temp: 98 F (36.7 C)  TempSrc: Oral  SpO2: 98%  Weight: 112 lb 3.2 oz (50.9 kg)  Height: 5\' 2"  (1.575 m)    Physical Exam  Constitutional: She is oriented to person, place, and time and well-developed, well-nourished, and in no distress.  HENT:  Head: Normocephalic and atraumatic.  Musculoskeletal:  Left 5th toe mild swelling, tender to palpation laterally and distally, somewhat limited ROM  Neurological: She is alert and oriented to person, place, and time.  Psychiatric: Mood, memory, affect and judgment normal.  Nursing note and vitals reviewed.    Assessment & Plan  1. Contusion of fifth toe, left, subsequent encounter Reviewed x-rays, no fracture but soft tissue swelling. Pain is improving. Advised to continue to use NSAID as needed, apply ice to relieve the soft tissue swelling.   Marquize Seib [facility] 09/02/2016 8:38 AM

## 2016-10-14 ENCOUNTER — Encounter: Payer: Self-pay | Admitting: *Deleted

## 2016-10-14 ENCOUNTER — Emergency Department
Admission: EM | Admit: 2016-10-14 | Discharge: 2016-10-14 | Disposition: A | Discharge status: Home / Self Care | Payer: BLUE CROSS/BLUE SHIELD | Attending: Emergency Medicine | Admitting: Emergency Medicine

## 2016-10-14 DIAGNOSIS — J45909 Unspecified asthma, uncomplicated: Secondary | ICD-10-CM | POA: Diagnosis not present

## 2016-10-14 DIAGNOSIS — Z79899 Other long term (current) drug therapy: Secondary | ICD-10-CM | POA: Diagnosis not present

## 2016-10-14 DIAGNOSIS — M7711 Lateral epicondylitis, right elbow: Secondary | ICD-10-CM | POA: Insufficient documentation

## 2016-10-14 DIAGNOSIS — Z791 Long term (current) use of non-steroidal anti-inflammatories (NSAID): Secondary | ICD-10-CM | POA: Insufficient documentation

## 2016-10-14 DIAGNOSIS — M25521 Pain in right elbow: Secondary | ICD-10-CM | POA: Diagnosis present

## 2016-10-14 MED ORDER — MELOXICAM 15 MG PO TABS: 15.0000 mg | ORAL_TABLET | Freq: Every day | ORAL | 0 refills | Status: DC

## 2016-10-14 NOTE — ED Triage Notes (Signed)
Pt has pain in right elbow and forearm.  No known injury.  Pt states painful to bend arm.

## 2016-10-14 NOTE — ED Provider Notes (Signed)
[facility] Emergency Department Provider Note  ____________________________________________  Time seen: Approximately 6:14 PM  I have reviewed the triage vital signs and the nursing notes.   HISTORY  Chief Complaint Arm Pain    HPI Angela Gordon is a 45 y.o. female , NAD, presents to the emergency department with several day history of right elbow pain. Patient states she has had pain about the right lateral elbow over the last couple of days seems to be worsening. States the pain can shoot into the right forearm and cause tingling in the tips of her fingers. Denies any injury or trauma. Has not noted any redness, swelling, bruising, open wounds or lacerations. Has not had any headaches, visual changes, chest pain, shortness breath. States she does do a lot of typing at work but no specific repetitive grabbing stretching or twisting motions. Has not completed any supportive care at home.   Past Medical History:  Diagnosis Date  . Asthma    Uses rescue inhaler occasionally.  Marland Kitchen History of blood transfusion 1992   Blood Loss from Toxemia of Pregnancy  . Osteoporosis    Strong FHx of Osteoporosis.  Marland Kitchen Scoliosis    Seen Dr. Jefm Bryant  . Sickle-cell trait ([place])   . Stroke [facility]) 1992   Toxemia of Pregnancy    Patient Active Problem List   Diagnosis Date Noted  . Vitamin D deficiency 05/07/2016  . Sickle-cell trait ([place]) 05/07/2016  . Hyperbilirubinemia 05/07/2016  . Encounter for annual physical exam 01/07/2016  . 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 Pain of right thumb 12/06/2015    Past Surgical History:  Procedure Laterality Date  . ABDOMINAL HYSTERECTOMY  2010   Menopausal symptoms, takes HRT  . CYST REMOVAL HAND Right 2005-06  . HERNIA REPAIR  2004  . TONSILLECTOMY  1978    Prior to Admission medications   Medication Sig Start Date End Date Taking? Authorizing Provider  albuterol (PROVENTIL) (5 MG/ML) 0.5% nebulizer solution Take  2.5 mg by nebulization as needed for wheezing or shortness of breath.    Historical Provider, MD  alendronate-cholecalciferol (FOSAMAX PLUS D) 70-2800 MG-UNIT tablet Take 1 tablet by mouth every 7 (seven) days. Take with a full glass of water on an empty stomach. 05/07/16   Roselee Nova, MD  meloxicam (MOBIC) 15 MG tablet Take 1 tablet (15 mg total) by mouth daily. 10/14/16   Blaine Guiffre L Korbin Mapps, [name]  naproxen (NAPROSYN) 500 MG tablet TAKE 1 TABLET (500 MG TOTAL) BY MOUTH 2 (TWO) TIMES DAILY WITH A MEAL. 12/19/15   Roselee Nova, MD  traMADol (ULTRAM) 50 MG tablet Take 1 tablet (50 mg total) by mouth every 6 (six) hours as needed for moderate pain. 08/25/16   Sable Feil, [name]    Allergies Penicillins and Scallops [shellfish allergy]  Family History  Problem Relation Age of Onset  . Hypertension Mother   . Osteoporosis Mother   . Cervical cancer Mother     Hysterectomy in 104s.  . Sickle cell trait Father   . Sickle cell trait Sister     Social History Social History  Substance Use Topics  . Smoking status: Never Smoker  . Smokeless tobacco: Never Used  . Alcohol use No     Comment: occasional     Review of Systems  Constitutional: No fatigue Eyes: No visual changes.  Cardiovascular: No chest pain. Respiratory: No shortness of breath. Musculoskeletal: Positive right elbow pain.  Skin: Negative for rash, redness, swelling,  bruising, open wounds or lacerations. Neurological: Positive tingling right fingertips that is intermittent. Negative for headaches, focal weakness or numbness. 10-point ROS otherwise negative.  ____________________________________________   PHYSICAL EXAM:  VITAL SIGNS: ED Triage Vitals  Enc Vitals Group     BP 10/14/16 1805 120/89     Pulse Rate 10/14/16 1805 66     Resp 10/14/16 1805 20     Temp 10/14/16 1805 98.6 F (37 C)     Temp Source 10/14/16 1805 Oral     SpO2 10/14/16 1805 99 %     Weight 10/14/16 1805 120 lb (54.4 kg)     Height  10/14/16 1805 5\' 2"  (1.575 m)     Head Circumference --      Peak Flow --      Pain Score 10/14/16 1806 6     Pain Loc --      Pain Edu? --      Excl. in [place]? --      Constitutional: Alert and oriented. Well appearing and in no acute distress. Eyes: Conjunctivae are normal. PERRL Head: Atraumatic. Cardiovascular: Good peripheral circulation with 2+ pulses in the right upper extremity.  Respiratory: Normal respiratory effort without tachypnea or retractions. Musculoskeletal: Tenderness to palpation about the right lateral epicondyles without bony abnormality or crepitus. Full range motion of the right upper extremity without difficulty. Some pain with full flexion of the right elbow about the lateral epicondyle. Grip strength in bilateral upper extremities is 5 out of 5 and equal. Neurologic:  Normal speech and language. No gross focal neurologic deficits are appreciated. Sensation to light touch grossly intact about the right upper extremity. Skin:  Skin is warm, dry and intact. No rash, redness, swelling, abnormal warmth, bruising, skin sores, open wounds noted. Psychiatric: Mood and affect are normal. Speech and behavior are normal. Patient exhibits appropriate insight and judgement.   ____________________________________________   LABS  None ____________________________________________  EKG  None ____________________________________________  RADIOLOGY  None ____________________________________________    PROCEDURES  Procedure(s) performed: None   Procedures   Medications - No data to display   ____________________________________________   INITIAL IMPRESSION / ASSESSMENT AND PLAN / ED COURSE  Pertinent labs & imaging results that were available during my care of the patient were reviewed by me and considered in my medical decision making (see chart for details).  Clinical Course     Patient's diagnosis is consistent with Right lateral epicondylitis.  Patient will be discharged home with prescriptions for meloxicam to take as directed. Patient is to follow up with Dr. Roland Rack in orthopedics if symptoms persist past this treatment course. Patient is given ED precautions to return to the ED for any worsening or new symptoms.    ____________________________________________  FINAL CLINICAL IMPRESSION(S) / ED DIAGNOSES  Final diagnoses:  Lateral epicondylitis of right elbow      NEW MEDICATIONS STARTED DURING THIS VISIT:  Discharge Medication List as of 10/14/2016  6:28 PM    START taking these medications   Details  meloxicam (MOBIC) 15 MG tablet Take 1 tablet (15 mg total) by mouth daily., Starting Wed 10/14/2016, [place], [name] 10/14/16 2055    Daymon Larsen, MD 10/14/16 2103

## 2016-10-14 NOTE — ED Notes (Signed)
Pt verbalized understanding of discharge instructions. NAD at this time. 

## 2016-10-26 MED ORDER — LIDOCAINE HCL (PF) 1 % IJ SOLN
INTRAMUSCULAR | Status: AC
  Filled 2016-10-26: qty 5

## 2016-10-29 DIAGNOSIS — M7710 Lateral epicondylitis, unspecified elbow: Secondary | ICD-10-CM | POA: Insufficient documentation

## 2016-12-01 ENCOUNTER — Telehealth: Payer: Self-pay | Admitting: Emergency Medicine

## 2016-12-01 ENCOUNTER — Other Ambulatory Visit: Payer: Self-pay | Admitting: Emergency Medicine

## 2016-12-01 MED ORDER — OSELTAMIVIR PHOSPHATE 75 MG PO CAPS: 75.0000 mg | ORAL_CAPSULE | Freq: Two times a day (BID) | ORAL | 0 refills | Status: DC

## 2016-12-01 NOTE — Progress Notes (Unsigned)
amif

## 2016-12-01 NOTE — Telephone Encounter (Signed)
Patient called and stated that her daughter tested positive for the flu on Sunday, now she has the symptoms.  Dr. Ancil Boozer was notified. Per Dr. Ancil Boozer call in Tamiflu 75 mg twice a day for 5 days. Script called to [facility]

## 2017-07-13 ENCOUNTER — Emergency Department
Admission: EM | Admit: 2017-07-13 | Discharge: 2017-07-13 | Disposition: A | Discharge status: Home / Self Care | Payer: BLUE CROSS/BLUE SHIELD | Attending: Emergency Medicine | Admitting: Emergency Medicine

## 2017-07-13 ENCOUNTER — Emergency Department: Payer: BLUE CROSS/BLUE SHIELD

## 2017-07-13 ENCOUNTER — Encounter: Payer: Self-pay | Admitting: Emergency Medicine

## 2017-07-13 DIAGNOSIS — Z79899 Other long term (current) drug therapy: Secondary | ICD-10-CM | POA: Diagnosis not present

## 2017-07-13 DIAGNOSIS — J45909 Unspecified asthma, uncomplicated: Secondary | ICD-10-CM | POA: Diagnosis not present

## 2017-07-13 DIAGNOSIS — M7551 Bursitis of right shoulder: Secondary | ICD-10-CM | POA: Diagnosis not present

## 2017-07-13 DIAGNOSIS — M25511 Pain in right shoulder: Secondary | ICD-10-CM | POA: Diagnosis present

## 2017-07-13 MED ORDER — TRAMADOL HCL 50 MG PO TABS: 50.0000 mg | ORAL_TABLET | Freq: Four times a day (QID) | ORAL | 0 refills | Status: DC | PRN

## 2017-07-13 MED ORDER — NAPROXEN 500 MG PO TABS: 500.0000 mg | ORAL_TABLET | Freq: Two times a day (BID) | ORAL | Status: DC

## 2017-07-13 NOTE — ED Triage Notes (Signed)
C/O right shoulder pain since Saturday.  Has been helping one of her children move to college. Pain worse when reaching behind.

## 2017-07-13 NOTE — Discharge Instructions (Signed)
Arm sling for 2-3 days as needed °

## 2017-07-13 NOTE — ED Provider Notes (Signed)
[facility] Emergency Department Provider Note   ____________________________________________   First MD Initiated Contact with Patient 07/13/17 1217     (approximate)  I have reviewed the triage vital signs and the nursing notes.   HISTORY  Chief Complaint Shoulder Pain     HPI Angela Gordon is a 46 y.o. female patient complaining of right shoulder pain for 3 days. Patient states  helping her child to relocate for college. Patient states she performed repetitive overhead reaching and lifting. Patient states pain increases with abduction overhead reaching. Patient rates the pain as 8/10. Patient described a pain is "acute aching". No palliative measures for complaint. Past Medical History:  Diagnosis Date  . Asthma    Uses rescue inhaler occasionally.  Marland Kitchen History of blood transfusion 1992   Blood Loss from Toxemia of Pregnancy  . Osteoporosis    Strong FHx of Osteoporosis.  Marland Kitchen Scoliosis    Seen Dr. Jefm Bryant  . Sickle-cell trait ([place])   . Stroke [facility]) 1992   Toxemia of Pregnancy    Patient Active Problem List   Diagnosis Date Noted  . Vitamin D deficiency 05/07/2016  . Sickle-cell trait ([place]) 05/07/2016  . Hyperbilirubinemia 05/07/2016  . Encounter for annual physical exam 01/07/2016  . 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 Pain of right thumb 12/06/2015    Past Surgical History:  Procedure Laterality Date  . ABDOMINAL HYSTERECTOMY  2010   Menopausal symptoms, takes HRT  . CYST REMOVAL HAND Right 2005-06  . HERNIA REPAIR  2004  . TONSILLECTOMY  1978    Prior to Admission medications   Medication Sig Start Date End Date Taking? Authorizing Provider  albuterol (PROVENTIL) (5 MG/ML) 0.5% nebulizer solution Take 2.5 mg by nebulization as needed for wheezing or shortness of breath.    [provider]  alendronate-cholecalciferol (FOSAMAX PLUS D) 70-2800 MG-UNIT tablet Take 1 tablet by mouth every 7 (seven) days. Take with  a full glass of water on an empty stomach. 05/07/16   Roselee Nova, MD  meloxicam (MOBIC) 15 MG tablet Take 1 tablet (15 mg total) by mouth daily. 10/14/16   Hagler, Jami L, [name]  naproxen (NAPROSYN) 500 MG tablet TAKE 1 TABLET (500 MG TOTAL) BY MOUTH 2 (TWO) TIMES DAILY WITH A MEAL. 12/19/15   Roselee Nova, MD  naproxen (NAPROSYN) 500 MG tablet Take 1 tablet (500 mg total) by mouth 2 (two) times daily with a meal. 07/13/17   Sable Feil, [name]  oseltamivir (TAMIFLU) 75 MG capsule Take 1 capsule (75 mg total) by mouth 2 (two) times daily. 12/01/16   Steele Sizer, MD  traMADol (ULTRAM) 50 MG tablet Take 1 tablet (50 mg total) by mouth every 6 (six) hours as needed for moderate pain. 08/25/16   Sable Feil, [name]  traMADol (ULTRAM) 50 MG tablet Take 1 tablet (50 mg total) by mouth every 6 (six) hours as needed for moderate pain. 07/13/17   Sable Feil, [name]    Allergies   Family History  Problem Relation Age of Onset  . Hypertension Mother   . Osteoporosis Mother   . Cervical cancer Mother        Hysterectomy in 17s.  . Sickle cell trait Father   . Sickle cell trait Sister     Social History Social History  Substance Use Topics  . Smoking status: Never Smoker  . Smokeless tobacco: Never Used  . Alcohol use No     Comment: occasional  Review of Systems  Constitutional: No fever/chills Eyes: No visual changes. ENT: No sore throat. Cardiovascular: Denies chest pain. Respiratory: Denies shortness of breath. Gastrointestinal: No abdominal pain.  No nausea, no vomiting.  No diarrhea.  No constipation. Genitourinary: Negative for dysuria. Musculoskeletal: Right shoulder pain Skin: Negative for rash. Neurological: Negative for headaches, focal weakness or numbness.   ____________________________________________   PHYSICAL EXAM:  VITAL SIGNS: ED Triage Vitals  Enc Vitals Group     BP 07/13/17 1146 (!) 115/97     Pulse Rate 07/13/17 1146 80     Resp  07/13/17 1151 16     Temp 07/13/17 1146 98.9 F (37.2 C)     Temp Source 07/13/17 1146 Oral     SpO2 07/13/17 1146 100 %     Weight 07/13/17 1151 120 lb (54.4 kg)     Height 07/13/17 1151 5\' 2"  (1.575 m)     Head Circumference --      Peak Flow --      Pain Score 07/13/17 1146 7     Pain Loc --      Pain Edu? --      Excl. in [place]? --     Constitutional: Alert and oriented. Well appearing and in no acute distress. Cardiovascular: Normal rate, regular rhythm. Grossly normal heart sounds.  Good peripheral circulation. Respiratory: Normal respiratory effort.  No retractions. Lungs CTAB. Musculoskeletal: No obvious deformity to the right shoulder. Patient is moderate guarding palpation of the humeral head. Patient decreased range of motion with abduction and opiate reaching. Strength against resistance 3/5.  Neurologic:  Normal speech and language. No gross focal neurologic deficits are appreciated. No gait instability. Skin:  Skin is warm, dry and intact. No rash noted. Psychiatric: Mood and affect are normal. Speech and behavior are normal.  ____________________________________________   LABS (all labs ordered are listed, but only abnormal results are displayed)  Labs Reviewed - No data to display ____________________________________________  EKG   ____________________________________________  RADIOLOGY  Dg Shoulder Right  Result Date: 07/13/2017 CLINICAL DATA:  Right shoulder pain for the past 3 days possibly due to over exertion. Limited range of motion. EXAM: RIGHT SHOULDER - 2+ VIEW COMPARISON:  None in PACs FINDINGS: The bones are subjectively adequately mineralized. The joint spaces are well maintained. The subacromial subdeltoid space appears normal. There is no acute fracture or dislocation. IMPRESSION: There is no acute or significant chronic bony abnormality of the right shoulder. Electronically Signed   By: David  Martinique M.D.   On: 07/13/2017 13:09    No acute  findings on x-ray of the right shoulder. __________________________________________   PROCEDURES  Procedure(s) performed: None  Procedures  Critical Care performed: No  ____________________________________________   INITIAL IMPRESSION / ASSESSMENT AND PLAN / ED COURSE  Pertinent labs & imaging results that were available during my care of the patient were reviewed by me and considered in my medical decision making (see chart for details).  Bursitis right shoulder. Discussed x-ray finding with patient. Patient discharged care instruction. Patient advised follow-up PCP for continual care. Patient given arm sling for 2-3 days as needed.      ____________________________________________   FINAL CLINICAL IMPRESSION(S) / ED DIAGNOSES  Final diagnoses:  Bursitis of right shoulder      NEW MEDICATIONS STARTED DURING THIS VISIT:  New Prescriptions   NAPROXEN (NAPROSYN) 500 MG TABLET    Take 1 tablet (500 mg total) by mouth 2 (two) times daily with a meal.   TRAMADOL (ULTRAM)  50 MG TABLET    Take 1 tablet (50 mg total) by mouth every 6 (six) hours as needed for moderate pain.     Note:  This document was prepared using Dragon voice recognition software and may include unintentional dictation errors.    Sable Feil, [name] 07/13/17 [place]    Arta Silence, MD 07/13/17 [phone]

## 2017-07-26 ENCOUNTER — Ambulatory Visit (INDEPENDENT_AMBULATORY_CARE_PROVIDER_SITE_OTHER): Payer: BLUE CROSS/BLUE SHIELD | Admitting: Obstetrics and Gynecology

## 2017-07-26 ENCOUNTER — Encounter: Payer: Self-pay | Admitting: Obstetrics and Gynecology

## 2017-07-26 VITALS — BP 108/72 | HR 73 | Ht 62.0 in | Wt 109.0 lb

## 2017-07-26 DIAGNOSIS — Z1239 Encounter for other screening for malignant neoplasm of breast: Secondary | ICD-10-CM

## 2017-07-26 DIAGNOSIS — Z01419 Encounter for gynecological examination (general) (routine) without abnormal findings: Secondary | ICD-10-CM | POA: Diagnosis not present

## 2017-07-26 DIAGNOSIS — Z8041 Family history of malignant neoplasm of ovary: Secondary | ICD-10-CM

## 2017-07-26 DIAGNOSIS — Z1231 Encounter for screening mammogram for malignant neoplasm of breast: Secondary | ICD-10-CM

## 2017-07-26 HISTORY — DX: Family history of malignant neoplasm of ovary: Z80.41

## 2017-07-26 NOTE — Progress Notes (Signed)
Angela Nova, MD   Chief Complaint  Patient presents with  . Gynecologic Exam     HPI:      Ms. Angela Gordon is a 46 y.o. No obstetric history on file. who LMP was No LMP recorded. Patient has had a hysterectomy., presents today for her annual examination.  Her menses are absent due to hyst. She does not have intermenstrual bleeding. She has been doing estrogen pellets for ERT ([facility] in [place]) with sx relief. She hasn't had any in a year but sx are tolerable.  Sex activity: single partner, contraception - status post hysterectomy.  Last Pap: January 28, 2016  Results were: no abnormalities /neg HPV DNA  Hx of STDs: none  Last mammogram: January 28, 2016  Results were: normal--routine follow-up in 12 months There is no FH of breast cancer. There is a FH of ovarian cancer in her pat aunt; pt had neg genetic testing. The patient does do self-breast exams.  Tobacco use: The patient denies current or previous tobacco use. Alcohol use: social drinker No drug use.  Exercise: moderately active  She does get adequate calcium and Vitamin D in her diet.  Since her last annual GYN exam, she has not had any significant changes in her health history. She has osteoporosis per 2017 DEXA and is on fosamax with PCP. She is doing calcium/VIt D/exercise.     Depression screen [facility] 2/9 05/07/2016 01/07/2016 12/06/2015  Decreased Interest 0 0 0  Down, Depressed, Hopeless 0 0 0  PHQ - 2 Score 0 0 0     Past Medical History:  Diagnosis Date  . Asthma    Uses rescue inhaler occasionally.  Marland Kitchen History of blood transfusion 1992   Blood Loss from Toxemia of Pregnancy  . Osteoporosis    Strong FHx of Osteoporosis.  Marland Kitchen Scoliosis    Seen Dr. Jefm Bryant  . Sickle-cell trait ([place])   . Stroke [facility]) 1992   Toxemia of Pregnancy    Past Surgical History:  Procedure Laterality Date  . ABDOMINAL HYSTERECTOMY  2010   total -Menopausal symptoms, takes HRT  . BREAST SURGERY Left 2004   biopsy,  benign  . CYST REMOVAL HAND Right 2005-06  . HERNIA REPAIR  2004  . TONSILLECTOMY  1978    Family History  Problem Relation Age of Onset  . Hypertension Mother   . Osteoporosis Mother   . Cervical cancer Mother        Hysterectomy in 36s.  . Stroke Mother   . Sickle cell trait Father   . Sickle cell trait Sister   . Heart disease Maternal Aunt   . Heart disease Maternal Uncle   . Hypertension Maternal Uncle   . Ovarian cancer Paternal Aunt 21  . Heart disease Maternal Grandmother   . Hypertension Maternal Grandmother   . Osteoporosis Maternal Grandmother   . Rheum arthritis Maternal Grandfather   . Hypertension Maternal Grandfather     Social History   Social History  . Marital status: Married    Spouse name: N/A  . Number of children: N/A  . Years of education: N/A   Occupational History  . Not on file.   Social History Main Topics  . Smoking status: Never Smoker  . Smokeless tobacco: Never Used  . Alcohol use No     Comment: occasional  . Drug use: No  . Sexual activity: Yes    Birth control/ protection: Surgical   Other Topics Concern  .  Not on file   Social History Narrative  . No narrative on file    Current Meds  Medication Sig  . albuterol (PROVENTIL) (5 MG/ML) 0.5% nebulizer solution Take 2.5 mg by nebulization as needed for wheezing or shortness of breath.  Marland Kitchen alendronate-cholecalciferol (FOSAMAX PLUS D) 70-2800 MG-UNIT tablet Take 1 tablet by mouth every 7 (seven) days. Take with a full glass of water on an empty stomach.  . meloxicam (MOBIC) 15 MG tablet Take 1 tablet (15 mg total) by mouth daily.  . naproxen (NAPROSYN) 500 MG tablet Take 1 tablet (500 mg total) by mouth 2 (two) times daily with a meal.  . traMADol (ULTRAM) 50 MG tablet Take 1 tablet (50 mg total) by mouth every 6 (six) hours as needed for moderate pain.     ROS:  Review of Systems  Constitutional: Negative for fatigue, fever and unexpected weight change.  Respiratory:  Negative for cough, shortness of breath and wheezing.   Cardiovascular: Negative for chest pain, palpitations and leg swelling.  Gastrointestinal: Negative for blood in stool, constipation, diarrhea, nausea and vomiting.  Endocrine: Negative for cold intolerance, heat intolerance and polyuria.  Genitourinary: Negative for dyspareunia, dysuria, flank pain, frequency, genital sores, hematuria, menstrual problem, pelvic pain, urgency, vaginal bleeding, vaginal discharge and vaginal pain.  Musculoskeletal: Negative for back pain, joint swelling and myalgias.  Skin: Negative for rash.  Neurological: Negative for dizziness, syncope, light-headedness, numbness and headaches.  Hematological: Negative for adenopathy.  Psychiatric/Behavioral: Negative for agitation, confusion, sleep disturbance and suicidal ideas. The patient is not nervous/anxious.      Objective: BP 108/72   Pulse 73   Ht 5\' 2"  (1.575 m)   Wt 109 lb (49.4 kg)   BMI 19.94 kg/m    Physical Exam  Constitutional: She is oriented to person, place, and time. She appears well-developed and well-nourished.  Genitourinary: Vagina normal. There is no rash or tenderness on the right labia. There is no rash or tenderness on the left labia. No erythema or tenderness in the vagina. No vaginal discharge found. Right adnexum does not display mass and does not display tenderness. Left adnexum does not display mass and does not display tenderness.  Genitourinary Comments: UTERUS/CX SURG REM  Neck: Normal range of motion. No thyromegaly present.  Cardiovascular: Normal rate, regular rhythm and normal heart sounds.   No murmur heard. Pulmonary/Chest: Effort normal and breath sounds normal. Right breast exhibits no mass, no nipple discharge, no skin change and no tenderness. Left breast exhibits no mass, no nipple discharge, no skin change and no tenderness.  Abdominal: Soft. There is no tenderness. There is no guarding.  Musculoskeletal: Normal  range of motion.  Neurological: She is alert and oriented to person, place, and time. No cranial nerve deficit.  Psychiatric: She has a normal mood and affect. Her behavior is normal.  Vitals reviewed.   Assessment/Plan: Encounter for annual routine gynecological examination  Screening for breast cancer - Pt to sched mammo.  - Plan: MM DIGITAL SCREENING BILATERAL  Family history of ovarian cancer - Pt had neg genetic testing.  GYN counsel mammography screening, menopause, osteoporosis, adequate intake of calcium and vitamin D, diet and exercise     F/U  Return in about 1 year (around 07/26/2018).  Alicia B. Copland, [name] 07/26/2017 1:38 PM

## 2017-07-29 DIAGNOSIS — M7541 Impingement syndrome of right shoulder: Secondary | ICD-10-CM | POA: Insufficient documentation

## 2017-08-06 ENCOUNTER — Other Ambulatory Visit: Payer: Self-pay | Admitting: Family Medicine

## 2017-08-06 DIAGNOSIS — M810 Age-related osteoporosis without current pathological fracture: Secondary | ICD-10-CM

## 2017-08-11 ENCOUNTER — Ambulatory Visit
Admission: RE | Admit: 2017-08-11 | Discharge: 2017-08-11 | Disposition: A | Discharge status: Home / Self Care | Payer: BLUE CROSS/BLUE SHIELD | Source: Ambulatory Visit | Attending: Obstetrics and Gynecology | Admitting: Obstetrics and Gynecology

## 2017-08-11 DIAGNOSIS — Z1231 Encounter for screening mammogram for malignant neoplasm of breast: Secondary | ICD-10-CM | POA: Diagnosis present

## 2017-08-11 DIAGNOSIS — N6310 Unspecified lump in the right breast, unspecified quadrant: Secondary | ICD-10-CM | POA: Insufficient documentation

## 2017-08-11 DIAGNOSIS — Z1239 Encounter for other screening for malignant neoplasm of breast: Secondary | ICD-10-CM

## 2017-08-11 DIAGNOSIS — R928 Other abnormal and inconclusive findings on diagnostic imaging of breast: Secondary | ICD-10-CM | POA: Insufficient documentation

## 2017-08-16 ENCOUNTER — Other Ambulatory Visit: Payer: Self-pay | Admitting: Obstetrics and Gynecology

## 2017-08-16 DIAGNOSIS — R928 Other abnormal and inconclusive findings on diagnostic imaging of breast: Secondary | ICD-10-CM

## 2017-08-16 DIAGNOSIS — N6489 Other specified disorders of breast: Secondary | ICD-10-CM

## 2017-08-19 ENCOUNTER — Encounter: Payer: Self-pay | Admitting: Obstetrics and Gynecology

## 2017-08-19 ENCOUNTER — Ambulatory Visit
Admission: RE | Admit: 2017-08-19 | Discharge: 2017-08-19 | Disposition: A | Discharge status: Home / Self Care | Payer: BLUE CROSS/BLUE SHIELD | Source: Ambulatory Visit | Attending: Obstetrics and Gynecology | Admitting: Obstetrics and Gynecology

## 2017-08-19 DIAGNOSIS — N6489 Other specified disorders of breast: Secondary | ICD-10-CM

## 2017-08-19 DIAGNOSIS — R928 Other abnormal and inconclusive findings on diagnostic imaging of breast: Secondary | ICD-10-CM

## 2017-08-26 ENCOUNTER — Inpatient Hospital Stay
Admission: RE | Admit: 2017-08-26 | Discharge: 2017-08-26 | Disposition: A | Discharge status: Home / Self Care | Payer: Self-pay | Source: Ambulatory Visit | Attending: *Deleted | Admitting: *Deleted

## 2017-08-26 ENCOUNTER — Other Ambulatory Visit: Payer: Self-pay | Admitting: *Deleted

## 2017-08-26 DIAGNOSIS — Z9289 Personal history of other medical treatment: Secondary | ICD-10-CM

## 2017-09-16 ENCOUNTER — Other Ambulatory Visit: Payer: Self-pay | Admitting: Family Medicine

## 2017-09-16 DIAGNOSIS — M810 Age-related osteoporosis without current pathological fracture: Secondary | ICD-10-CM

## 2017-11-25 ENCOUNTER — Ambulatory Visit (INDEPENDENT_AMBULATORY_CARE_PROVIDER_SITE_OTHER): Payer: BLUE CROSS/BLUE SHIELD

## 2017-11-25 DIAGNOSIS — Z23 Encounter for immunization: Secondary | ICD-10-CM

## 2018-02-13 ENCOUNTER — Emergency Department
Admission: EM | Admit: 2018-02-13 | Discharge: 2018-02-14 | Disposition: A | Discharge status: Home / Self Care | Payer: BLUE CROSS/BLUE SHIELD | Attending: Student in an Organized Health Care Education/Training Program | Admitting: Student in an Organized Health Care Education/Training Program

## 2018-02-13 ENCOUNTER — Other Ambulatory Visit: Payer: Self-pay

## 2018-02-13 ENCOUNTER — Emergency Department: Payer: BLUE CROSS/BLUE SHIELD

## 2018-02-13 DIAGNOSIS — Y92410 Unspecified street and highway as the place of occurrence of the external cause: Secondary | ICD-10-CM | POA: Diagnosis not present

## 2018-02-13 DIAGNOSIS — J45909 Unspecified asthma, uncomplicated: Secondary | ICD-10-CM | POA: Diagnosis not present

## 2018-02-13 DIAGNOSIS — S46812A Strain of other muscles, fascia and tendons at shoulder and upper arm level, left arm, initial encounter: Secondary | ICD-10-CM

## 2018-02-13 DIAGNOSIS — Y9389 Activity, other specified: Secondary | ICD-10-CM | POA: Diagnosis not present

## 2018-02-13 DIAGNOSIS — Z8673 Personal history of transient ischemic attack (TIA), and cerebral infarction without residual deficits: Secondary | ICD-10-CM | POA: Insufficient documentation

## 2018-02-13 DIAGNOSIS — S4992XA Unspecified injury of left shoulder and upper arm, initial encounter: Secondary | ICD-10-CM | POA: Diagnosis present

## 2018-02-13 DIAGNOSIS — Z79899 Other long term (current) drug therapy: Secondary | ICD-10-CM | POA: Diagnosis not present

## 2018-02-13 DIAGNOSIS — Y998 Other external cause status: Secondary | ICD-10-CM | POA: Insufficient documentation

## 2018-02-13 MED ORDER — CYCLOBENZAPRINE HCL 10 MG PO TABS: 10.0000 mg | ORAL_TABLET | Freq: Three times a day (TID) | ORAL | 0 refills | Status: DC | PRN

## 2018-02-13 MED ORDER — NAPROXEN 500 MG PO TABS: 500.0000 mg | ORAL_TABLET | Freq: Two times a day (BID) | ORAL | 0 refills | Status: DC
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
## 2018-02-18 ENCOUNTER — Ambulatory Visit: Payer: BLUE CROSS/BLUE SHIELD | Admitting: Family Medicine

## 2018-02-18 ENCOUNTER — Encounter: Payer: Self-pay | Admitting: Family Medicine

## 2018-02-18 VITALS — BP 124/72 | HR 95 | Temp 99.0°F | Resp 18 | Ht 62.0 in | Wt 118.8 lb

## 2018-02-18 DIAGNOSIS — S46812D Strain of other muscles, fascia and tendons at shoulder and upper arm level, left arm, subsequent encounter: Secondary | ICD-10-CM | POA: Diagnosis not present

## 2018-02-18 DIAGNOSIS — M549 Dorsalgia, unspecified: Secondary | ICD-10-CM

## 2018-02-18 NOTE — Patient Instructions (Addendum)
Stop Naproxen, start taking Meloxicam daily for 5-7 days. Apply heat as tolerated daily  Gentle stretches of the neck and arms and back as discussed each day as tolerated.  Cervical Strain and Sprain Rehab Ask your health care provider which exercises are safe for you. Do exercises exactly as told by your health care provider and adjust them as directed. It is normal to feel mild stretching, pulling, tightness, or discomfort as you do these exercises, but you should stop right away if you feel sudden pain or your pain gets worse.Do not begin these exercises until told by your health care provider. Stretching and range of motion exercises These exercises warm up your muscles and joints and improve the movement and flexibility of your neck. These exercises also help to relieve pain, numbness, and tingling. Exercise A: Cervical side bend  1. Using good posture, sit on a stable chair or stand up. 2. Without moving your shoulders, slowly tilt your left / right ear to your shoulder until you feel a stretch in your neck muscles. You should be looking straight ahead. 3. Hold for __________ seconds. 4. Repeat with the other side of your neck. Repeat __________ times. Complete this exercise __________ times a day. Exercise B: Cervical rotation  1. Using good posture, sit on a stable chair or stand up. 2. Slowly turn your head to the side as if you are looking over your left / right shoulder. ? Keep your eyes level with the ground. ? Stop when you feel a stretch along the side and the back of your neck. 3. Hold for __________ seconds. 4. Repeat this by turning to your other side. Repeat __________ times. Complete this exercise __________ times a day. Exercise C: Thoracic extension and pectoral stretch 1. Roll a towel or a small blanket so it is about 4 inches (10 cm) in diameter. 2. Lie down on your back on a firm surface. 3. Put the towel lengthwise, under your spine in the middle of your back. It  should not be not under your shoulder blades. The towel should line up with your spine from your middle back to your lower back. 4. Put your hands behind your head and let your elbows fall out to your sides. 5. Hold for __________ seconds. Repeat __________ times. Complete this exercise __________ times a day. Strengthening exercises These exercises build strength and endurance in your neck. Endurance is the ability to use your muscles for a long time, even after your muscles get tired. Exercise D: Upper cervical flexion, isometric 1. Lie on your back with a thin pillow behind your head and a small rolled-up towel under your neck. 2. Gently tuck your chin toward your chest and nod your head down to look toward your feet. Do not lift your head off the pillow. 3. Hold for __________ seconds. 4. Release the tension slowly. Relax your neck muscles completely before you repeat this exercise. Repeat __________ times. Complete this exercise __________ times a day. Exercise E: Cervical extension, isometric  1. Stand about 6 inches (15 cm) away from a wall, with your back facing the wall. 2. Place a soft object, about 6-8 inches (15-20 cm) in diameter, between the back of your head and the wall. A soft object could be a small pillow, a ball, or a folded towel. 3. Gently tilt your head back and press into the soft object. Keep your jaw and forehead relaxed. 4. Hold for __________ seconds. 5. Release the tension slowly. Relax your neck  muscles completely before you repeat this exercise. Repeat __________ times. Complete this exercise __________ times a day. Posture and body mechanics  Body mechanics refers to the movements and positions of your body while you do your daily activities. Posture is part of body mechanics. Good posture and healthy body mechanics can help to relieve stress in your body's tissues and joints. Good posture means that your spine is in its natural S-curve position (your spine is  neutral), your shoulders are pulled back slightly, and your head is not tipped forward. The following are general guidelines for applying improved posture and body mechanics to your everyday activities. Standing  When standing, keep your spine neutral and keep your feet about hip-width apart. Keep a slight bend in your knees. Your ears, shoulders, and hips should line up.  When you do a task in which you stand in one place for a long time, place one foot up on a stable object that is 2-4 inches (5-10 cm) high, such as a footstool. This helps keep your spine neutral. Sitting   When sitting, keep your spine neutral and your keep feet flat on the floor. Use a footrest, if necessary, and keep your thighs parallel to the floor. Avoid rounding your shoulders, and avoid tilting your head forward.  When working at a desk or a computer, keep your desk at a height where your hands are slightly lower than your elbows. Slide your chair under your desk so you are close enough to maintain good posture.  When working at a computer, place your monitor at a height where you are looking straight ahead and you do not have to tilt your head forward or downward to look at the screen. Resting When lying down and resting, avoid positions that are most painful for you. Try to support your neck in a neutral position. You can use a contour pillow or a small rolled-up towel. Your pillow should support your neck but not push on it. This information is not intended to replace advice given to you by your health care provider. Make sure you discuss any questions you have with your health care provider. Document Released: 11/16/2005 Document Revised: 07/23/2016 Document Reviewed: 10/23/2015 Elsevier Interactive Patient Education  2018 [place] for Routine Care of Injuries Many injuries can be cared for using rest, ice, compression, and elevation (RICE therapy). Using RICE therapy can help to lessen pain and  swelling. It can help your body to heal. Rest Reduce your normal activities and avoid using the injured part of your body. You can go back to your normal activities when you feel okay and your doctor says it is okay. Ice Do not put ice on your bare skin.  Put ice in a plastic bag.  Place a towel between your skin and the bag.  Leave the ice on for 20 minutes, 2-3 times a day.  Do this for as long as told by your doctor. Compression Compression means putting pressure on the injured area. This can be done with an elastic bandage. If an elastic bandage has been applied:  Remove and reapply the bandage every 3-4 hours or as told by your doctor.  Make sure the bandage is not wrapped too tight. Wrap the bandage more loosely if part of your body beyond the bandage is blue, swollen, cold, painful, or loses feeling (numb).  See your doctor if the bandage seems to make your problems worse.  Elevation Elevation means keeping the injured area raised. Raise  the injured area above your heart or the center of your chest if you can. When should I get help? You should get help if:  You keep having pain and swelling.  Your symptoms get worse.  Get help right away if: You should get help right away if:  You have sudden bad pain at or below the area of your injury.  You have redness or more swelling around your injury.  You have tingling or numbness at or below the injury that does not go away when you take off the bandage.  This information is not intended to replace advice given to you by your health care provider. Make sure you discuss any questions you have with your health care provider. Document Released: 05/04/2008 Document Revised: 10/13/2016 Document Reviewed: 10/24/2014 Elsevier Interactive Patient Education  2017 [employer].
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
## 2018-02-21 ENCOUNTER — Ambulatory Visit: Payer: BLUE CROSS/BLUE SHIELD | Admitting: Family Medicine

## 2018-02-21 ENCOUNTER — Encounter: Payer: Self-pay | Admitting: Family Medicine

## 2018-02-21 VITALS — BP 126/82 | HR 93 | Temp 97.9°F | Resp 14 | Ht 62.5 in | Wt 119.0 lb

## 2018-02-21 DIAGNOSIS — E559 Vitamin D deficiency, unspecified: Secondary | ICD-10-CM | POA: Diagnosis not present

## 2018-02-21 DIAGNOSIS — D573 Sickle-cell trait: Secondary | ICD-10-CM

## 2018-02-21 DIAGNOSIS — R6889 Other general symptoms and signs: Secondary | ICD-10-CM | POA: Diagnosis not present

## 2018-02-21 DIAGNOSIS — M810 Age-related osteoporosis without current pathological fracture: Secondary | ICD-10-CM

## 2018-02-21 NOTE — Patient Instructions (Addendum)
Please do call to schedule your bone density study; the number to schedule one at either [facility] or [facility] is [phone] or [phone] If you have not heard anything from my staff in a week about any orders/referrals/studies from today, please contact us here to follow-up [phone]  Osteoporosis Osteoporosis is the thinning and loss of density in the bones. Osteoporosis makes the bones more brittle, fragile, and likely to break (fracture). Over time, osteoporosis can cause the bones to become so weak that they fracture after a simple fall. The bones most likely to fracture are the bones in the hip, wrist, and spine. What are the causes? The exact cause is not known. What increases the risk? Anyone can develop osteoporosis. You may be at greater risk if you have a family history of the condition or have poor nutrition. You may also have a higher risk if you are:  Female.  8 years old or older.  A smoker.  Not physically active.  White or Asian.  Slender.  What are the signs or symptoms? A fracture might be the first sign of the disease, especially if it results from a fall or injury that would not usually cause a bone to break. Other signs and symptoms include:  Low back and neck pain.  Stooped posture.  Height loss.  How is this diagnosed? To make a diagnosis, your health care provider may:  Take a medical history.  Perform a physical exam.  Order tests, such as: ? A bone mineral density test. ? A dual-energy X-ray absorptiometry test.  How is this treated? The goal of osteoporosis treatment is to strengthen your bones to reduce your risk of a fracture. Treatment may involve:  Making lifestyle changes, such as: ? Eating a diet rich in calcium. ? Doing weight-bearing and muscle-strengthening exercises. ? Stopping tobacco use. ? Limiting alcohol intake.  Taking medicine to slow the process of bone loss or to increase  bone density.  Monitoring your levels of calcium and vitamin D.  Follow these instructions at home:  Include calcium and vitamin D in your diet. Calcium is important for bone health, and vitamin D helps the body absorb calcium.  Perform weight-bearing and muscle-strengthening exercises as directed by your health care provider.  Do not use any tobacco products, including cigarettes, chewing tobacco, and electronic cigarettes. If you need help quitting, ask your health care provider.  Limit your alcohol intake.  Take medicines only as directed by your health care provider.  Keep all follow-up visits as directed by your health care provider. This is important.  Take precautions at home to lower your risk of falling, such as: ? Keeping rooms well lit and clutter free. ? Installing safety rails on stairs. ? Using rubber mats in the bathroom and other areas that are often wet or slippery. Get help right away if: You fall or injure yourself. This information is not intended to replace advice given to you by your health care provider. Make sure you discuss any questions you have with your health care provider. Document Released: 08/26/2005 Document Revised: 04/20/2016 Document Reviewed: 04/26/2014 Elsevier Interactive Patient Education  [employer].

## 2018-02-21 NOTE — Assessment & Plan Note (Signed)
Check vit D level and supplement as needed

## 2018-02-21 NOTE — Assessment & Plan Note (Signed)
Discussed genetics; one copy protective against malaria
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
## 2018-02-21 NOTE — Assessment & Plan Note (Signed)
Strong family hx of osteoporosis; has seen Dr. Jefm Bryant, rheum, but I cannot see any other of those records today; check vit D, iPTH, calcium, phos

## 2018-02-21 NOTE — Assessment & Plan Note (Signed)
Suspect cellular turnover, heme with sickle cell trait

## 2018-02-22 LAB — CBC WITH DIFFERENTIAL/PLATELET
BASOS PCT: 0.8 %
Basophils Absolute: 50 cells/uL (ref 0–200)
EOS ABS: 118 {cells}/uL (ref 15–500)
Eosinophils Relative: 1.9 %
HCT: 39 % (ref 35.0–45.0)
Hemoglobin: 13.2 g/dL (ref 11.7–15.5)
Lymphs Abs: 1544 cells/uL (ref 850–3900)
MCH: 31.1 pg (ref 27.0–33.0)
MCHC: 33.8 g/dL (ref 32.0–36.0)
MCV: 91.8 fL (ref 80.0–100.0)
MONOS PCT: 7 %
MPV: 10.2 fL (ref 7.5–12.5)
Neutro Abs: 4055 cells/uL (ref 1500–7800)
Neutrophils Relative %: 65.4 %
PLATELETS: 209 10*3/uL (ref 140–400)
RBC: 4.25 10*6/uL (ref 3.80–5.10)
RDW: 12 % (ref 11.0–15.0)
TOTAL LYMPHOCYTE: 24.9 %
WBC mixed population: 434 cells/uL (ref 200–950)
WBC: 6.2 10*3/uL (ref 3.8–10.8)

## 2018-02-22 LAB — COMPLETE METABOLIC PANEL WITH GFR
AG RATIO: 1.9 (calc) (ref 1.0–2.5)
ALKALINE PHOSPHATASE (APISO): 50 U/L (ref 33–115)
ALT: 17 U/L (ref 6–29)
AST: 20 U/L (ref 10–35)
Albumin: 4 g/dL (ref 3.6–5.1)
BUN: 16 mg/dL (ref 7–25)
CO2: 27 mmol/L (ref 20–32)
Calcium: 9 mg/dL (ref 8.6–10.2)
Chloride: 107 mmol/L (ref 98–110)
Creat: 0.85 mg/dL (ref 0.50–1.10)
GFR, EST AFRICAN AMERICAN: 96 mL/min/{1.73_m2} (ref >=60)
GFR, Est Non African American: 83 mL/min/{1.73_m2} (ref >=60)
GLUCOSE: 84 mg/dL (ref 65–139)
Globulin: 2.1 g/dL (calc) (ref 1.9–3.7)
POTASSIUM: 4.4 mmol/L (ref 3.5–5.3)
Sodium: 141 mmol/L (ref 135–146)
Total Bilirubin: 0.7 mg/dL (ref 0.2–1.2)
Total Protein: 6.1 g/dL (ref 6.1–8.1)

## 2018-02-22 LAB — TSH: TSH: 1.08 mIU/L

## 2018-02-22 LAB — VITAMIN D 25 HYDROXY (VIT D DEFICIENCY, FRACTURES): VIT D 25 HYDROXY: 23 ng/mL — AB (ref 30–100)

## 2018-02-22 LAB — PHOSPHORUS: Phosphorus: 2.6 mg/dL (ref 2.5–4.5)

## 2018-05-08 ENCOUNTER — Emergency Department
Admission: EM | Admit: 2018-05-08 | Discharge: 2018-05-08 | Disposition: A | Discharge status: Home / Self Care | Payer: BLUE CROSS/BLUE SHIELD | Attending: Emergency Medicine | Admitting: Emergency Medicine

## 2018-05-08 ENCOUNTER — Other Ambulatory Visit: Payer: Self-pay

## 2018-05-08 DIAGNOSIS — S61213A Laceration without foreign body of left middle finger without damage to nail, initial encounter: Secondary | ICD-10-CM | POA: Diagnosis not present

## 2018-05-08 DIAGNOSIS — Z8673 Personal history of transient ischemic attack (TIA), and cerebral infarction without residual deficits: Secondary | ICD-10-CM | POA: Diagnosis not present

## 2018-05-08 DIAGNOSIS — J45909 Unspecified asthma, uncomplicated: Secondary | ICD-10-CM | POA: Diagnosis not present

## 2018-05-08 DIAGNOSIS — Y999 Unspecified external cause status: Secondary | ICD-10-CM | POA: Diagnosis not present

## 2018-05-08 DIAGNOSIS — S6992XA Unspecified injury of left wrist, hand and finger(s), initial encounter: Secondary | ICD-10-CM | POA: Diagnosis present

## 2018-05-08 DIAGNOSIS — Z79899 Other long term (current) drug therapy: Secondary | ICD-10-CM | POA: Insufficient documentation

## 2018-05-08 DIAGNOSIS — W260XXA Contact with knife, initial encounter: Secondary | ICD-10-CM | POA: Diagnosis not present

## 2018-05-08 DIAGNOSIS — Y939 Activity, unspecified: Secondary | ICD-10-CM | POA: Insufficient documentation

## 2018-05-08 DIAGNOSIS — Y929 Unspecified place or not applicable: Secondary | ICD-10-CM | POA: Diagnosis not present

## 2018-05-08 MED ORDER — LIDOCAINE HCL (PF) 1 % IJ SOLN
5.0000 mL | Freq: Once | INTRAMUSCULAR | Status: AC
Start: 2018-05-08 — End: 2018-05-08
  Administered 2018-05-08: 5 mL via INTRADERMAL
  Filled 2018-05-08: qty 5

## 2018-05-08 MED ORDER — TETANUS-DIPHTH-ACELL PERTUSSIS 5-2.5-18.5 LF-MCG/0.5 IM SUSP
0.5000 mL | Freq: Once | INTRAMUSCULAR | Status: AC
  Administered 2018-05-08: 0.5 mL via INTRAMUSCULAR
  Filled 2018-05-08: qty 0.5

## 2018-05-08 NOTE — ED Provider Notes (Signed)
[facility] Emergency Department Provider Note  ____________________________________________  Time seen: Approximately 4:13 PM  I have reviewed the triage vital signs and the nursing notes.   HISTORY  Chief Complaint Laceration    HPI Angela Gordon is a 47 y.o. female  that presents to the emergency department for evaluation of left middle finger laceration.  Patient was using a brand new knife when she cut her finger.  She is able to bend her finger without difficulty.  No blood thinners.  No additional injury.   Past Medical History:  Diagnosis Date  . Asthma    Uses rescue inhaler occasionally.  Marland Kitchen History of blood transfusion 1992   Blood Loss from Toxemia of Pregnancy  . Osteoporosis    Strong FHx of Osteoporosis.  Marland Kitchen Scoliosis    Seen Dr. Jefm Bryant  . Sickle-cell trait ([place])   . Stroke [facility]) 1992   Toxemia of Pregnancy    Patient Active Problem List   Diagnosis Date Noted  . Impingement syndrome of right shoulder region 07/29/2017  . Family history of ovarian cancer 07/26/2017  . Lateral epicondylitis 10/29/2016  . Vitamin D deficiency 05/07/2016  . Sickle-cell trait ([place]) 05/07/2016  . Hyperbilirubinemia 05/07/2016  . Encounter for annual physical exam 01/07/2016  . 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 Pain of right thumb 12/06/2015    Past Surgical History:  Procedure Laterality Date  . ABDOMINAL HYSTERECTOMY  2010   total -Menopausal symptoms, takes HRT  . BREAST EXCISIONAL BIOPSY Left 2003   neg bx dr Jamal Collin  . BREAST SURGERY Left 2004   biopsy, benign  . CYST REMOVAL HAND Right 2005-06  . HERNIA REPAIR  2004  . TONSILLECTOMY  1978    Prior to Admission medications   Medication Sig Start Date End Date Taking? Authorizing Provider  albuterol (PROVENTIL) (5 MG/ML) 0.5% nebulizer solution Take 2.5 mg by nebulization as needed for wheezing or shortness of breath.    [provider]   alendronate-cholecalciferol (FOSAMAX PLUS D) 70-2800 MG-UNIT tablet Take 1 tablet by mouth every 7 (seven) days. Take with a full glass of water on an empty stomach. 05/07/16   Roselee Nova, MD  cyclobenzaprine (FLEXERIL) 10 MG tablet Take 1 tablet (10 mg total) by mouth 3 (three) times daily as needed for muscle spasms. 02/13/18   Triplett, Johnette Abraham B, [name]  meloxicam (MOBIC) 15 MG tablet Take 15 mg by mouth daily. 10/15/16   [provider]    Allergies Erythromycin base; Penicillins; Scallops [shellfish allergy]; and Erythromycin  Family History  Problem Relation Age of Onset  . Hypertension Mother   . Osteoporosis Mother   . Cervical cancer Mother        Hysterectomy in 72s.  . Stroke Mother   . Sickle cell trait Father   . Sickle cell trait Sister   . Heart disease Maternal Aunt   . Heart disease Maternal Uncle   . Hypertension Maternal Uncle   . Ovarian cancer Paternal Aunt 41  . Heart disease Maternal Grandmother   . Hypertension Maternal Grandmother   . Osteoporosis Maternal Grandmother   . Rheum arthritis Maternal Grandfather   . Hypertension Maternal Grandfather     Social History Social History   Tobacco Use  . Smoking status: Never Smoker  . Smokeless tobacco: Never Used  Substance Use Topics  . Alcohol use: No    Alcohol/week: 1.2 oz    Types: 2 Glasses of wine per week    Comment:  occasional  . Drug use: No     Review of Systems  Gastrointestinal:  No nausea, no vomiting.  Musculoskeletal: Positive for finger pain. Skin: Negative for rash, ecchymosis.  Positive for laceration. Neurological: Negative for numbness or tingling   ____________________________________________   PHYSICAL EXAM:  VITAL SIGNS: ED Triage Vitals  Enc Vitals Group     BP 05/08/18 1505 132/88     Pulse Rate 05/08/18 1505 73     Resp 05/08/18 1505 16     Temp 05/08/18 1505 98.3 F (36.8 C)     Temp Source 05/08/18 1505 Oral     SpO2 05/08/18 1505 100 %      Weight 05/08/18 1506 115 lb (52.2 kg)     Height 05/08/18 1506 5\' 2"  (1.575 m)     Head Circumference --      Peak Flow --      Pain Score 05/08/18 1506 7     Pain Loc --      Pain Edu? --      Excl. in [place]? --      Constitutional: Alert and oriented. Well appearing and in no acute distress. Eyes: Conjunctivae are normal. PERRL. EOMI. Head: Atraumatic. ENT:      Ears:      Nose: No congestion/rhinnorhea.      Mouth/Throat: Mucous membranes are moist.  Neck: No stridor. Cardiovascular: Normal rate, regular rhythm.  Good peripheral circulation. Respiratory: Normal respiratory effort without tachypnea or retractions. Lungs CTAB. Good air entry to the bases with no decreased or absent breath sounds. Musculoskeletal: Full range of motion to all extremities. No gross deformities appreciated.  Able to perform resisted flexion of finger. Neurologic:  Normal speech and language. No gross focal neurologic deficits are appreciated.  Skin:  Skin is warm, dry and intact. 1cm laceration to distal left 3rd finger.    ____________________________________________   LABS (all labs ordered are listed, but only abnormal results are displayed)  Labs Reviewed - No data to display ____________________________________________  EKG   ____________________________________________  RADIOLOGY   No results found.  ____________________________________________    PROCEDURES  Procedure(s) performed:    Procedures  LACERATION REPAIR Performed by: Laban Emperor  Consent: Verbal consent obtained.  Consent given by: patient  Prepped and Draped in normal sterile fashion  Wound explored: No foreign bodies   Laceration Location: left middle finger  Laceration Length: 2 cm  Anesthesia: None  Local anesthetic: lidocaine 1% without epinephrine  Anesthetic total: 2 ml  Irrigation method: syringe  Amount of cleaning: 551ml normal saline  Skin closure: 4-0 nylon  Number of sutures:  3  Technique: Simple interrupted  Patient tolerance: Patient tolerated the procedure well with no immediate complications.  Medications  Tdap (BOOSTRIX) injection 0.5 mL (0.5 mLs Intramuscular Given 05/08/18 1634)  lidocaine (PF) (XYLOCAINE) 1 % injection 5 mL (5 mLs Intradermal Given by Other 05/08/18 1635)     ____________________________________________   INITIAL IMPRESSION / ASSESSMENT AND PLAN / ED COURSE  Pertinent labs & imaging results that were available during my care of the patient were reviewed by me and considered in my medical decision making (see chart for details).  Review of the [place] CSRS was performed in accordance of the [place] prior to dispensing any controlled drugs.   Patient presented to the emergency department for evaluation of finger laceration.  Vital signs and exam are reassuring.  Laceration was repaired with stitches.   Patient is to follow up with PCP as directed. Patient  is given ED precautions to return to the ED for any worsening or new symptoms.     ____________________________________________  FINAL CLINICAL IMPRESSION(S) / ED DIAGNOSES  Final diagnoses:  Laceration of left middle finger without foreign body without damage to nail, initial encounter      NEW MEDICATIONS STARTED DURING THIS VISIT:  ED Discharge Orders    None          This chart was dictated using voice recognition software/Dragon. Despite best efforts to proofread, errors can occur which can change the meaning. Any change was purely unintentional.    Laban Emperor, [name] 05/08/18 1726    Nance Pear, MD 05/09/18 1104

## 2018-05-08 NOTE — ED Triage Notes (Signed)
Pt cut L middle finger on brand new knife. States it's deep. Denies blood thinners. Alert, oriented, ambulatory. No distress noted. Believes tetanus within last 5 years.

## 2018-05-25 ENCOUNTER — Ambulatory Visit: Payer: BLUE CROSS/BLUE SHIELD | Admitting: Nurse Practitioner

## 2018-05-25 ENCOUNTER — Encounter: Payer: Self-pay | Admitting: Nurse Practitioner

## 2018-05-25 VITALS — BP 122/82 | HR 82 | Temp 98.1°F | Resp 12 | Wt 114.4 lb

## 2018-05-25 DIAGNOSIS — S61213D Laceration without foreign body of left middle finger without damage to nail, subsequent encounter: Secondary | ICD-10-CM | POA: Diagnosis not present

## 2018-05-25 NOTE — Progress Notes (Signed)
Name: Angela Gordon   MRN: [id number]    DOB: 1971-04-06   Date:05/25/2018       Progress Note  Subjective  Chief Complaint  Chief Complaint  Patient presents with  . Suture / Staple Removal    cut finger while slicing tomatoe on 0/62 was told to removal stiches in 10 days    HPI  Sliced tip of left hand, middle finger on 6/10 3 sutures placed in the ER. Patient denies fever, drainage, redness. Endorses tenderness sts painful at night has trouble sleeping. Doesn't take any medicine. No swelling noted. Full ROM, no worsening pain with movement.   Patient Active Problem List   Diagnosis Date Noted  . Impingement syndrome of right shoulder region 07/29/2017  . Family history of ovarian cancer 07/26/2017  . Lateral epicondylitis 10/29/2016  . Vitamin D deficiency 05/07/2016  . Sickle-cell trait ([place]) 05/07/2016  . Hyperbilirubinemia 05/07/2016  . Encounter for annual physical exam 01/07/2016  . 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 Pain of right thumb 12/06/2015    Past Medical History:  Diagnosis Date  . Asthma    Uses rescue inhaler occasionally.  Marland Kitchen History of blood transfusion 1992   Blood Loss from Toxemia of Pregnancy  . Osteoporosis    Strong FHx of Osteoporosis.  Marland Kitchen Scoliosis    Seen Dr. Jefm Bryant  . Sickle-cell trait ([place])   . Stroke [facility]) 1992   Toxemia of Pregnancy    Past Surgical History:  Procedure Laterality Date  . ABDOMINAL HYSTERECTOMY  2010   total -Menopausal symptoms, takes HRT  . BREAST EXCISIONAL BIOPSY Left 2003   neg bx dr Jamal Collin  . BREAST SURGERY Left 2004   biopsy, benign  . CYST REMOVAL HAND Right 2005-06  . HERNIA REPAIR  2004  . TONSILLECTOMY  1978    Social History   Tobacco Use  . Smoking status: Never Smoker  . Smokeless tobacco: Never Used  Substance Use Topics  . Alcohol use: No    Alcohol/week: 1.2 oz    Types: 2 Glasses of wine per week    Comment: occasional     Current Outpatient Medications:  .  albuterol  (PROVENTIL) (5 MG/ML) 0.5% nebulizer solution, Take 2.5 mg by nebulization as needed for wheezing or shortness of breath., Disp: , Rfl:  .  alendronate-cholecalciferol (FOSAMAX PLUS D) 70-2800 MG-UNIT tablet, Take 1 tablet by mouth every 7 (seven) days. Take with a full glass of water on an empty stomach., Disp: 52 tablet, Rfl: 0 .  meloxicam (MOBIC) 15 MG tablet, Take 15 mg by mouth daily., Disp: , Rfl:  .  cyclobenzaprine (FLEXERIL) 10 MG tablet, Take 1 tablet (10 mg total) by mouth 3 (three) times daily as needed for muscle spasms. (Patient not taking: Reported on 05/25/2018), Disp: 30 tablet, Rfl: 0  Allergies  Allergen Reactions  . Erythromycin Base Other (See Comments)  . Penicillins Rash and Diarrhea  . Scallops [Shellfish Allergy] Anaphylaxis  . Erythromycin     ROS   No other specific complaints in a complete review of systems (except as listed in HPI above).  Objective  Vitals:   05/25/18 1119  BP: 122/82  Pulse: 82  Resp: 12  Temp: 98.1 F (36.7 C)  TempSrc: Oral  SpO2: 96%  Weight: 114 lb 6.4 oz (51.9 kg)    Body mass index is 20.92 kg/m.  Nursing Note and Vital Signs reviewed.  Physical Exam   Constitutional: Patient appears well-developed and well-nourished. O No distress.  Cardiovascular: Normal rate,  Pulmonary/Chest: Effort normal  Skin: middle finger of left hand- laceration closed, no redness, swelling, drainage, streaking Psychiatric: Patient has a normal mood and affect. behavior is normal. Judgment and thought content normal.  No results found for this or any previous visit (from the past 72 hour(s)).  Assessment & Plan  1. Laceration of left middle finger without foreign body without damage to nail, subsequent encounter healed - Suture removal Tetanus UTD

## 2018-05-26 NOTE — Addendum Note (Signed)
Addended by: Fredderick Severance on: 05/26/2018 08:29 AM   Modules accepted: Level of Service

## 2018-07-01 ENCOUNTER — Emergency Department
Admission: EM | Admit: 2018-07-01 | Discharge: 2018-07-01 | Disposition: A | Discharge status: Home / Self Care | Payer: BLUE CROSS/BLUE SHIELD | Attending: Student in an Organized Health Care Education/Training Program | Admitting: Student in an Organized Health Care Education/Training Program

## 2018-07-01 ENCOUNTER — Other Ambulatory Visit: Payer: Self-pay

## 2018-07-01 DIAGNOSIS — M7918 Myalgia, other site: Secondary | ICD-10-CM

## 2018-07-01 DIAGNOSIS — M791 Myalgia, unspecified site: Secondary | ICD-10-CM | POA: Insufficient documentation

## 2018-07-01 MED ORDER — ACETAMINOPHEN 325 MG PO TABS
650.0000 mg | ORAL_TABLET | Freq: Once | ORAL | Status: AC
  Administered 2018-07-01: 650 mg via ORAL
  Filled 2018-07-01: qty 2

## 2018-07-01 MED ORDER — CYCLOBENZAPRINE HCL 5 MG PO TABS: 5.0000 mg | ORAL_TABLET | Freq: Three times a day (TID) | ORAL | 0 refills | Status: DC | PRN
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
## 2018-09-05 ENCOUNTER — Other Ambulatory Visit: Payer: Self-pay | Admitting: Family Medicine

## 2018-09-05 DIAGNOSIS — Z1231 Encounter for screening mammogram for malignant neoplasm of breast: Secondary | ICD-10-CM

## 2018-09-06 ENCOUNTER — Ambulatory Visit
Admission: RE | Admit: 2018-09-06 | Discharge: 2018-09-06 | Disposition: A | Discharge status: Home / Self Care | Payer: BLUE CROSS/BLUE SHIELD | Source: Ambulatory Visit | Attending: Family Medicine | Admitting: Family Medicine

## 2018-09-06 DIAGNOSIS — Z1231 Encounter for screening mammogram for malignant neoplasm of breast: Secondary | ICD-10-CM | POA: Diagnosis present

## 2019-05-03 ENCOUNTER — Encounter: Payer: Self-pay | Admitting: Nurse Practitioner

## 2019-05-03 ENCOUNTER — Ambulatory Visit (INDEPENDENT_AMBULATORY_CARE_PROVIDER_SITE_OTHER): Payer: BC Managed Care – PPO | Admitting: Nurse Practitioner

## 2019-05-03 ENCOUNTER — Other Ambulatory Visit: Payer: Self-pay

## 2019-05-03 VITALS — BP 116/70 | HR 93 | Temp 98.3°F | Resp 14 | Ht 62.0 in | Wt 111.2 lb

## 2019-05-03 DIAGNOSIS — N644 Mastodynia: Secondary | ICD-10-CM

## 2019-05-03 DIAGNOSIS — M810 Age-related osteoporosis without current pathological fracture: Secondary | ICD-10-CM | POA: Diagnosis not present

## 2019-05-03 DIAGNOSIS — E559 Vitamin D deficiency, unspecified: Secondary | ICD-10-CM | POA: Diagnosis not present

## 2019-05-03 DIAGNOSIS — D573 Sickle-cell trait: Secondary | ICD-10-CM

## 2019-05-03 MED ORDER — ALENDRONATE-CHOLECALCIFEROL 70-2800 MG-UNIT PO TABS: 1.0000 | ORAL_TABLET | ORAL | 0 refills | Status: DC

## 2019-05-03 NOTE — Patient Instructions (Addendum)
For breast pain you can use:  A well-fitting brassiere to better support the breast application of warm compresses or ice packs or gentle massage Take acetaminophen 500mg  every 6 hours as needed for breast pain Can additionally take ibuprofen 400mg  every 8 hours OR naproxen 500mg  ever 12 hours for pain- make sure you take these with food.   If you have not heard anything from my staff in a week about any orders/referrals/studies from today, please contact us here to follow-up [phone]

## 2019-05-03 NOTE — Progress Notes (Signed)
Name: Angela Gordon   MRN: [id number]    DOB: June 07, 1971   Date:05/03/2019       Progress Note  Subjective  Chief Complaint  Chief Complaint  Patient presents with  . Breast Problem    Bilateral. Onset 2 weeks ago. Tender, heaviness, painful to not wear a bra, warm to the touch sometimes.    HPI  Patient endorses bilateral breast pain mid May and has been progressively getting heavier and then became more tender. States she can lay on her stomach because her breasts are so tender. States she has to wear a bra all the time now due to pain. She is normally a 32B but has had to go up to  36 B to be comfortable. After her hysterectomy she was having nipple discharge and say Dr. Bary Castilla and Jamal Collin and was going to have surgery but it self resolved- think this was maybe 2013.  Last mammogram was 08/2018- There are no findings suspicious for malignancy.  Does not smoke, does not have a lot of fat in her diet.  Is on hormone replacement therapy- pellet replacement in hip- had last implant May 15th and noticed this afterwards. Gets this done [facility] MD in [place]- Dr. Lois Huxley  Moms sister has breast cancer. Denies mass, skin changes, or bloody nipple discharge or any nipple discharge.   Osteoporosis Was taking fosamax but never got it refilled after provider left in 2018. She is taking calcium and vitamin D supplements  Vitamin D deficiency She has been taking OTC supplementation  Sickle-cell trait States she has never had any issues, does stay very well hydrated.   PHQ2/9: Depression screen [facility] 2/9 05/03/2019 02/18/2018 05/07/2016 01/07/2016 12/06/2015  Decreased Interest 0 0 0 0 0  Down, Depressed, Hopeless 0 0 0 0 0  PHQ - 2 Score 0 0 0 0 0  Altered sleeping 0 - - - -  Tired, decreased energy 0 - - - -  Change in appetite 0 - - - -  Feeling bad or failure about yourself  0 - - - -  Trouble concentrating 0 - - - -  Moving slowly or fidgety/restless 0 - - - -  Suicidal thoughts  0 - - - -  PHQ-9 Score 0 - - - -  Difficult doing work/chores Not difficult at all - - - -     PHQ reviewed. Negative  Patient Active Problem List   Diagnosis Date Noted  . Impingement syndrome of right shoulder region 07/29/2017  . Family history of ovarian cancer 07/26/2017  . Lateral epicondylitis 10/29/2016  . Vitamin D deficiency 05/07/2016  . Sickle-cell trait ([place]) 05/07/2016  . Hyperbilirubinemia 05/07/2016  . Encounter for annual physical exam 01/07/2016  . 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 Pain of right thumb 12/06/2015    Past Medical History:  Diagnosis Date  . Asthma    Uses rescue inhaler occasionally.  Marland Kitchen History of blood transfusion 1992   Blood Loss from Toxemia of Pregnancy  . Osteoporosis    Strong FHx of Osteoporosis.  Marland Kitchen Scoliosis    Seen Dr. Jefm Bryant  . Sickle-cell trait ([place])   . Stroke [facility]) 1992   Toxemia of Pregnancy    Past Surgical History:  Procedure Laterality Date  . ABDOMINAL HYSTERECTOMY  2010   total -Menopausal symptoms, takes HRT  . BREAST EXCISIONAL BIOPSY Left 2003   neg bx dr Jamal Collin  . BREAST SURGERY Left 2004   biopsy, benign  . CYST REMOVAL  HAND Right 2005-06  . HERNIA REPAIR  2004  . TONSILLECTOMY  1978    Social History   Tobacco Use  . Smoking status: Never Smoker  . Smokeless tobacco: Never Used  Substance Use Topics  . Alcohol use: Yes    Alcohol/week: 2.0 standard drinks    Types: 2 Glasses of wine per week    Comment: occasional     Current Outpatient Medications:  .  albuterol (PROVENTIL) (5 MG/ML) 0.5% nebulizer solution, Take 2.5 mg by nebulization as needed for wheezing or shortness of breath., Disp: , Rfl:  .  alendronate-cholecalciferol (FOSAMAX PLUS D) 70-2800 MG-UNIT tablet, Take 1 tablet by mouth every 7 (seven) days. Take with a full glass of water on an empty stomach., Disp: 52 tablet, Rfl: 0 .  cyclobenzaprine (FLEXERIL) 5 MG tablet, Take 1 tablet (5 mg total) by mouth 3 (three) times  daily as needed for muscle spasms., Disp: 15 tablet, Rfl: 0 .  meloxicam (MOBIC) 15 MG tablet, Take 15 mg by mouth daily., Disp: , Rfl:   Allergies  Allergen Reactions  . Erythromycin Base Other (See Comments)  . Penicillins Rash and Diarrhea  . Scallops [Shellfish Allergy] Anaphylaxis  . Erythromycin     ROS    No other specific complaints in a complete review of systems (except as listed in HPI above).  Objective  Vitals:   05/03/19 1102  BP: 116/70  Pulse: 93  Resp: 14  Temp: 98.3 F (36.8 C)  TempSrc: Oral  SpO2: 99%  Weight: 111 lb 3.2 oz (50.4 kg)  Height: 5\' 2"  (1.575 m)    Body mass index is 20.34 kg/m.  Nursing Note and Vital Signs reviewed.  Physical Exam Constitutional:      Appearance: Normal appearance. She is well-developed.  HENT:     Head: Normocephalic and atraumatic.     Right Ear: Hearing normal.     Left Ear: Hearing normal.  Eyes:     Conjunctiva/sclera: Conjunctivae normal.  Cardiovascular:     Rate and Rhythm: Normal rate and regular rhythm.     Heart sounds: Normal heart sounds.  Pulmonary:     Effort: Pulmonary effort is normal.     Breath sounds: Normal breath sounds.  Chest:     Chest wall: Tenderness present. No mass.     Breasts:        Right: Tenderness present. No nipple discharge or skin change.        Left: Tenderness present. No nipple discharge or skin change.  Musculoskeletal: Normal range of motion.  Lymphadenopathy:     Upper Body:     Right upper body: No axillary adenopathy.     Left upper body: No axillary adenopathy.  Neurological:     Mental Status: She is alert and oriented to person, place, and time.  Psychiatric:        Speech: Speech normal.        Behavior: Behavior normal. Behavior is cooperative.        Thought Content: Thought content normal.        Judgment: Judgment normal.      No results found for this or any previous visit (from the past 48 hour(s)).  Assessment & Plan 1.  Mastalgia Reassurance provided, recommended to discuss with HRT prescriber - MM DIAG BREAST TOMO BILATERAL; Future  2. Osteoporosis, post-menopausal  - alendronate-cholecalciferol (FOSAMAX PLUS D) 70-2800 MG-UNIT tablet; Take 1 tablet by mouth every 7 (seven) days. Take with a full glass  of water on an empty stomach.  Dispense: 52 tablet; Refill: 0  3. Vitamin D deficiency - alendronate-cholecalciferol (FOSAMAX PLUS D) 70-2800 MG-UNIT tablet; Take 1 tablet by mouth every 7 (seven) days. Take with a full glass of water on an empty stomach.  Dispense: 52 tablet; Refill: 0  4. Sickle-cell trait [facility]) Education provided.

## 2019-07-25 ENCOUNTER — Other Ambulatory Visit: Payer: Self-pay | Admitting: Family Medicine

## 2019-07-25 DIAGNOSIS — Z1231 Encounter for screening mammogram for malignant neoplasm of breast: Secondary | ICD-10-CM

## 2019-08-15 ENCOUNTER — Emergency Department
Admission: EM | Admit: 2019-08-15 | Discharge: 2019-08-15 | Disposition: A | Discharge status: Home / Self Care | Payer: BC Managed Care – PPO | Attending: Emergency Medicine | Admitting: Emergency Medicine

## 2019-08-15 ENCOUNTER — Other Ambulatory Visit: Payer: Self-pay

## 2019-08-15 ENCOUNTER — Emergency Department: Payer: BC Managed Care – PPO

## 2019-08-15 ENCOUNTER — Encounter: Payer: Self-pay | Admitting: *Deleted

## 2019-08-15 DIAGNOSIS — J45909 Unspecified asthma, uncomplicated: Secondary | ICD-10-CM | POA: Diagnosis not present

## 2019-08-15 DIAGNOSIS — Y999 Unspecified external cause status: Secondary | ICD-10-CM | POA: Diagnosis not present

## 2019-08-15 DIAGNOSIS — Y9389 Activity, other specified: Secondary | ICD-10-CM | POA: Insufficient documentation

## 2019-08-15 DIAGNOSIS — X500XXA Overexertion from strenuous movement or load, initial encounter: Secondary | ICD-10-CM | POA: Diagnosis not present

## 2019-08-15 DIAGNOSIS — Y9289 Other specified places as the place of occurrence of the external cause: Secondary | ICD-10-CM | POA: Diagnosis not present

## 2019-08-15 DIAGNOSIS — R102 Pelvic and perineal pain: Secondary | ICD-10-CM | POA: Insufficient documentation

## 2019-08-15 DIAGNOSIS — Z79899 Other long term (current) drug therapy: Secondary | ICD-10-CM | POA: Insufficient documentation

## 2019-08-15 DIAGNOSIS — S79921A Unspecified injury of right thigh, initial encounter: Secondary | ICD-10-CM | POA: Diagnosis present

## 2019-08-15 DIAGNOSIS — S76111A Strain of right quadriceps muscle, fascia and tendon, initial encounter: Secondary | ICD-10-CM | POA: Diagnosis not present

## 2019-08-15 NOTE — ED Notes (Signed)
Patient states has been taking 800 mg ibuprofen and tylenol.

## 2019-08-15 NOTE — ED Triage Notes (Signed)
Pt to ED reporting she was lifting things in the house and moving around when she felt a pop and pain in her right thigh and groin. Pain when pressing the gas pedal in her car on the way to ED.

## 2019-08-15 NOTE — ED Notes (Signed)
Patient was moving furniture and heard a pop in upper leg in groin area yesterday. Patient has been applying heat to the area with some comfort but when no heat is applied is having pain. Patient states is difficult to walk and go up and down stairs.

## 2019-08-15 NOTE — ED Provider Notes (Signed)
[facility] Emergency Department Provider Note  ____________________________________________  Time seen: Approximately 8:07 PM  I have reviewed the triage vital signs and the nursing notes.   HISTORY  Chief Complaint Leg Pain and Groin Pain    HPI Angela Gordon is a 48 y.o. female who presents the emergency department complaining of right anterior leg/hip pain.  Patient was moving her daughter, bent over to pick up a heavy item when she felt a pop in her right anterior leg.  Patient has had pain to the anterior thigh and a little pain in the right inguinal region.  Patient is able to continue to bear weight but doing so increases the pain drastically.  No direct trauma to the hip.  No edema, ecchymosis.  Patient has good range of motion to the hip, knee.  Patient is concerned primarily as she has a significant history of osteoporosis.  Patient does not feel like it is "broken" but with her history she is just overly concerned given the musculoskeletal pain.  No other complaints at this time.  No radicular symptoms in the lower extremities.  No low back pain.       Past Medical History:  Diagnosis Date  . Asthma    Uses rescue inhaler occasionally.  Marland Kitchen History of blood transfusion 1992   Blood Loss from Toxemia of Pregnancy  . Osteoporosis    Strong FHx of Osteoporosis.  Marland Kitchen Scoliosis    Seen Dr. Jefm Bryant  . Sickle-cell trait ([place])   . Stroke [facility]) 1992   Toxemia of Pregnancy    Patient Active Problem List   Diagnosis Date Noted  . Family history of ovarian cancer 07/26/2017  . Vitamin D deficiency 05/07/2016  . Sickle-cell trait ([place]) 05/07/2016  . 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 ABDOMINAL HYSTERECTOMY  2010   total -Menopausal symptoms, takes HRT  . BREAST EXCISIONAL BIOPSY Left 2003   neg bx dr Jamal Collin  . BREAST SURGERY Left 2004   biopsy, benign  . CYST REMOVAL HAND Right  2005-06  . HERNIA REPAIR  2004  . TONSILLECTOMY  1978    Prior to Admission medications   Medication Sig Start Date End Date Taking? Authorizing Provider  albuterol (PROVENTIL) (5 MG/ML) 0.5% nebulizer solution Take 2.5 mg by nebulization as needed for wheezing or shortness of breath.    [provider]  alendronate-cholecalciferol (FOSAMAX PLUS D) 70-2800 MG-UNIT tablet Take 1 tablet by mouth every 7 (seven) days. Take with a full glass of water on an empty stomach. 05/03/19   Poulose, Bethel Born, NP    Allergies Erythromycin base, Penicillins, Scallops [shellfish allergy], and Erythromycin  Family History  Problem Relation Age of Onset  . Hypertension Mother   . Osteoporosis Mother   . Cervical cancer Mother        Hysterectomy in 28s.  . Stroke Mother   . Sickle cell trait Father   . Sickle cell trait Sister   . Heart disease Maternal Aunt   . Heart disease Maternal Uncle   . Hypertension Maternal Uncle   . Ovarian cancer Paternal Aunt 83  . Heart disease Maternal Grandmother   . Hypertension Maternal Grandmother   . Osteoporosis Maternal Grandmother   . Rheum arthritis Maternal Grandfather   . Hypertension Maternal Grandfather     Social History Social History   Tobacco Use  . Smoking status: Never Smoker  . Smokeless tobacco: Never Used  Substance  Use Topics  . Alcohol use: Yes    Alcohol/week: 2.0 standard drinks    Types: 2 Glasses of wine per week    Comment: occasional  . Drug use: No     Review of Systems  Constitutional: No fever/chills Eyes: No visual changes. No discharge ENT: No upper respiratory complaints. Cardiovascular: no chest pain. Respiratory: no cough. No SOB. Gastrointestinal: No abdominal pain.  No nausea, no vomiting.  No diarrhea.  No constipation. Musculoskeletal: Right hip/right anterior thigh pain Skin: Negative for rash, abrasions, lacerations, ecchymosis. Neurological: Negative for headaches, focal weakness or  numbness. 10-point ROS otherwise negative.  ____________________________________________   PHYSICAL EXAM:  VITAL SIGNS: ED Triage Vitals  Enc Vitals Group     BP 08/15/19 1928 (!) 144/98     Pulse Rate 08/15/19 1928 71     Resp 08/15/19 1928 16     Temp 08/15/19 1928 98.7 F (37.1 C)     Temp Source 08/15/19 1928 Oral     SpO2 08/15/19 1928 100 %     Weight 08/15/19 1929 110 lb (49.9 kg)     Height 08/15/19 1929 5\' 2"  (1.575 m)     Head Circumference --      Peak Flow --      Pain Score 08/15/19 1929 8     Pain Loc --      Pain Edu? --      Excl. in [place]? --      Constitutional: Alert and oriented. Well appearing and in no acute distress. Eyes: Conjunctivae are normal. PERRL. EOMI. Head: Atraumatic. ENT:      Ears:       Nose: No congestion/rhinnorhea.      Mouth/Throat: Mucous membranes are moist.  Neck: No stridor.    Cardiovascular: Normal rate, regular rhythm. Normal S1 and S2.  Good peripheral circulation. Respiratory: Normal respiratory effort without tachypnea or retractions. Lungs CTAB. Good air entry to the bases with no decreased or absent breath sounds. Musculoskeletal: Full range of motion to all extremities. No gross deformities appreciated.  Visualization of the right hip reveals no deformity.  No gross edema, ecchymosis.  Good range of motion to the hip.  Patient is nontender to palpation along the iliac crest, greater trochanter region.  Minimal tenderness to palpation along the pubic rami.  No tenderness to palpation over proximal femur but mid femur patient does have an area of tenderness along the anterior aspect.  No palpable abnormality or other palpable findings in this area.  No other tenderness to palpation over the right lower extremity.  Full range of motion to the knee and ankle joint.  Dorsalis pedis pulse intact distally.  Sensation intact distally. Neurologic:  Normal speech and language. No gross focal neurologic deficits are appreciated.  Skin:   Skin is warm, dry and intact. No rash noted. Psychiatric: Mood and affect are normal. Speech and behavior are normal. Patient exhibits appropriate insight and judgement.   ____________________________________________   LABS (all labs ordered are listed, but only abnormal results are displayed)  Labs Reviewed - No data to display ____________________________________________  EKG   ____________________________________________  RADIOLOGY I personally viewed and evaluated these images as part of my medical decision making, as well as reviewing the written report by the radiologist.  Dg Hip Unilat W Or Wo Pelvis 2-3 Views Right  Result Date: 08/15/2019 CLINICAL DATA:  Lifting things around the house and felt a pop in her thigh and groin. EXAM: DG HIP (WITH OR WITHOUT  PELVIS) 2-3V RIGHT COMPARISON:  None. FINDINGS: There is no evidence of hip fracture or dislocation. There is no evidence of arthropathy or other focal bone abnormality. IMPRESSION: Negative. Electronically Signed   By: Abelardo Diesel M.D.   On: 08/15/2019 20:38    ____________________________________________    PROCEDURES  Procedure(s) performed:    Procedures    Medications - No data to display   ____________________________________________   INITIAL IMPRESSION / ASSESSMENT AND PLAN / ED COURSE  Pertinent labs & imaging results that were available during my care of the patient were reviewed by me and considered in my medical decision making (see chart for details).  Review of the [place] CSRS was performed in accordance of the [place] prior to dispensing any controlled drugs.           Patient's diagnosis is consistent with quadricep strain.  Patient presented to the emergency department complaining of right anterior thigh pain.  Patient was attempting to lift an object, felt/heard a pop and has been experiencing anterior thigh/hip pain since.  Overall exam is reassuring.  X-ray reveals no acute osseous  abnormality.  Patient has a prescription for meloxicam and is instructed to use same for the next 2 weeks.  Patient will be placed on crutches to assist with ambulation patient is instructed to use crutches to take some of the weight off while ambulating on both lower extremities..  Follow-up with primary care or orthopedics as needed.  Patient is given ED precautions to return to the ED for any worsening or new symptoms.     ____________________________________________  FINAL CLINICAL IMPRESSION(S) / ED DIAGNOSES  Final diagnoses:  Strain of right quadriceps, initial encounter      NEW MEDICATIONS STARTED DURING THIS VISIT:  ED Discharge Orders    None          This chart was dictated using voice recognition software/Dragon. Despite best efforts to proofread, errors can occur which can change the meaning. Any change was purely unintentional.    Darletta Moll, [name] 08/15/19 2053    Nena Polio, MD 08/15/19 [phone]

## 2019-09-08 ENCOUNTER — Ambulatory Visit
Admission: RE | Admit: 2019-09-08 | Discharge: 2019-09-08 | Disposition: A | Discharge status: Home / Self Care | Payer: BC Managed Care – PPO | Source: Ambulatory Visit | Attending: Family Medicine | Admitting: Family Medicine

## 2019-09-08 DIAGNOSIS — Z1231 Encounter for screening mammogram for malignant neoplasm of breast: Secondary | ICD-10-CM

## 2019-09-25 ENCOUNTER — Emergency Department
Admission: EM | Admit: 2019-09-25 | Discharge: 2019-09-25 | Disposition: A | Discharge status: Home / Self Care | Payer: BC Managed Care – PPO | Attending: Emergency Medicine | Admitting: Emergency Medicine

## 2019-09-25 ENCOUNTER — Encounter: Payer: Self-pay | Admitting: Emergency Medicine

## 2019-09-25 ENCOUNTER — Other Ambulatory Visit: Payer: Self-pay

## 2019-09-25 DIAGNOSIS — M791 Myalgia, unspecified site: Secondary | ICD-10-CM | POA: Insufficient documentation

## 2019-09-25 DIAGNOSIS — M549 Dorsalgia, unspecified: Secondary | ICD-10-CM | POA: Diagnosis present

## 2019-09-25 DIAGNOSIS — J45909 Unspecified asthma, uncomplicated: Secondary | ICD-10-CM | POA: Insufficient documentation

## 2019-09-25 DIAGNOSIS — M7918 Myalgia, other site: Secondary | ICD-10-CM

## 2019-09-25 MED ORDER — IBUPROFEN 600 MG PO TABS: 600.0000 mg | ORAL_TABLET | Freq: Three times a day (TID) | ORAL | 0 refills | Status: DC | PRN

## 2019-09-25 MED ORDER — TRAMADOL HCL 50 MG PO TABS: 50.0000 mg | ORAL_TABLET | Freq: Four times a day (QID) | ORAL | 0 refills | Status: DC | PRN

## 2019-09-25 MED ORDER — ORPHENADRINE CITRATE 30 MG/ML IJ SOLN
60.0000 mg | Freq: Two times a day (BID) | INTRAMUSCULAR | Status: DC
  Administered 2019-09-25: 15:00:00 60 mg via INTRAMUSCULAR
  Filled 2019-09-25: qty 2

## 2019-09-25 MED ORDER — HYDROMORPHONE HCL 1 MG/ML IJ SOLN
1.0000 mg | Freq: Once | INTRAMUSCULAR | Status: AC
  Administered 2019-09-25: 1 mg via INTRAMUSCULAR
  Filled 2019-09-25: qty 1

## 2019-09-25 MED ORDER — CYCLOBENZAPRINE HCL 10 MG PO TABS: 10.0000 mg | ORAL_TABLET | Freq: Three times a day (TID) | ORAL | 0 refills | Status: DC | PRN

## 2019-09-25 NOTE — ED Triage Notes (Signed)
Says pain in whole back and a lot in upper back.  Says she moved some heavy items putting them up in attic on Friday.

## 2019-09-25 NOTE — ED Provider Notes (Signed)
[facility] Emergency Department Provider Note   ____________________________________________   None    (approximate)  I have reviewed the triage vital signs and the nursing notes.   HISTORY  Chief Complaint Back Pain    HPI Angela Gordon is a 48 y.o. female patient presents with 3 days upper low back pain secondary to repetitive heavy lifting.  Patient states she was passing heavy boxes up to her son to store in the attic.  Patient stated weakness Saturday morning with increasing pain to the upper back and by evening pain was in the lower back.  Patient denies radicular component to her pain.  Patient describes the pain as "throbbing/achy".  No relief using heat packs.  Patient rates the pain as a 9/10.  Patient has a history of osteoporosis.      Past Medical History:  Diagnosis Date  . Asthma    Uses rescue inhaler occasionally.  Marland Kitchen History of blood transfusion 1992   Blood Loss from Toxemia of Pregnancy  . Osteoporosis    Strong FHx of Osteoporosis.  Marland Kitchen Scoliosis    Seen Dr. Jefm Bryant  . Sickle-cell trait ([place])   . Stroke [facility]) 1992   Toxemia of Pregnancy    Patient Active Problem List   Diagnosis Date Noted  . Family history of ovarian cancer 07/26/2017  . Vitamin D deficiency 05/07/2016  . Sickle-cell trait ([place]) 05/07/2016  . 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 ABDOMINAL HYSTERECTOMY  2010   total -Menopausal symptoms, takes HRT  . BREAST EXCISIONAL BIOPSY Left 2003   neg bx dr Jamal Collin  . BREAST SURGERY Left 2004   biopsy, benign  . CYST REMOVAL HAND Right 2005-06  . HERNIA REPAIR  2004  . TONSILLECTOMY  1978    Prior to Admission medications   Medication Sig Start Date End Date Taking? Authorizing Provider  albuterol (PROVENTIL) (5 MG/ML) 0.5% nebulizer solution Take 2.5 mg by nebulization as needed for wheezing or shortness of breath.    [provider]  alendronate-cholecalciferol (FOSAMAX PLUS D) 70-2800 MG-UNIT tablet Take 1 tablet by mouth every 7 (seven) days. Take with a full glass of water on an empty stomach. 05/03/19   Poulose, Bethel Born, NP  cyclobenzaprine (FLEXERIL) 10 MG tablet Take 1 tablet (10 mg total) by mouth 3 (three) times daily as needed. 09/25/19   Sable Feil, [name]  ibuprofen (ADVIL) 600 MG tablet Take 1 tablet (600 mg total) by mouth every 8 (eight) hours as needed. 09/25/19   Sable Feil, [name]  traMADol (ULTRAM) 50 MG tablet Take 1 tablet (50 mg total) by mouth every 6 (six) hours as needed. 09/25/19 09/24/20  Sable Feil, [name]    Allergies Erythromycin base, Penicillins, Scallops [shellfish allergy], and Erythromycin  Family History  Problem Relation Age of Onset  . Hypertension Mother   . Osteoporosis Mother   . Cervical cancer Mother        Hysterectomy in 85s.  . Stroke Mother   . Sickle cell trait Father   . Sickle cell trait Sister   . Heart disease Maternal Aunt   . Heart disease Maternal Uncle   . Hypertension Maternal Uncle   . Ovarian cancer Paternal Aunt 77  . Heart disease Maternal Grandmother   . Hypertension Maternal Grandmother   . Osteoporosis Maternal Grandmother   . Rheum arthritis Maternal Grandfather   . Hypertension Maternal Grandfather  Social History Social History   Tobacco Use  . Smoking status: Never Smoker  . Smokeless tobacco: Never Used  Substance Use Topics  . Alcohol use: Yes    Alcohol/week: 2.0 standard drinks    Types: 2 Glasses of wine per week    Comment: occasional  . Drug use: No    Review of Systems Constitutional: No fever/chills Eyes: No visual changes. ENT: No sore throat. Cardiovascular: Denies chest pain. Respiratory: Denies shortness of breath. Gastrointestinal: No abdominal pain.  No nausea, no vomiting.  No diarrhea.  No constipation. Genitourinary: Negative for dysuria. Musculoskeletal: Upper and lower back pain.   Skin:  Negative for rash. Neurological: Negative for headaches, focal weakness or numbness. Hematological/Lymphatic:  Sickle cell trait. Allergic/Immunilogical: Shellfish, erythromycin, penicillin.  ____________________________________________   PHYSICAL EXAM:  VITAL SIGNS: ED Triage Vitals  Enc Vitals Group     BP --      Pulse --      Resp --      Temp --      Temp src --      SpO2 --      Weight 09/25/19 1413 115 lb (52.2 kg)     Height 09/25/19 1413 5\' 2"  (1.575 m)     Head Circumference --      Peak Flow --      Pain Score 09/25/19 1412 9     Pain Loc --      Pain Edu? --      Excl. in [place]? --    Constitutional: Alert and oriented.  Moderate distress.   Eyes: Conjunctivae are normal. PERRL. EOMI. Head: Atraumatic. Nose: No congestion/rhinnorhea. Mouth/Throat: Mucous membranes are moist.  Oropharynx non-erythematous. Neck: No stridor.   Cardiovascular: Normal rate, regular rhythm. Grossly normal heart sounds.  Good peripheral circulation. Respiratory: Normal respiratory effort.  No retractions. Lungs CTAB. Musculoskeletal: Moderate Palpation T10-L3.  No lower extremity tenderness nor edema.  No joint effusions. Neurologic:  Normal speech and language. No gross focal neurologic deficits are appreciated. No gait instability. Skin:  Skin is warm, dry and intact. No rash noted. Psychiatric: Mood and affect are normal. Speech and behavior are normal.  ____________________________________________   LABS (all labs ordered are listed, but only abnormal results are displayed)  Labs Reviewed - No data to display ____________________________________________  EKG   ____________________________________________  RADIOLOGY  ED MD interpretation:    Official radiology report(s): No results found.  ____________________________________________   PROCEDURES  Procedure(s) performed (including Critical Care):  Procedures   ____________________________________________    INITIAL IMPRESSION / ASSESSMENT AND PLAN / ED COURSE  As part of my medical decision making, I reviewed the following data within the [place]         Patient present with 3 days of right low back pain secondary to repetitive heavy lifting.  Physical exam is consistent with musculoskeletal pain.  Patient given discharge care instruction advised take medication as directed.      ____________________________________________   FINAL CLINICAL IMPRESSION(S) / ED DIAGNOSES  Final diagnoses:  Musculoskeletal pain     ED Discharge Orders         Ordered    traMADol (ULTRAM) 50 MG tablet  Every 6 hours PRN     09/25/19 1447    cyclobenzaprine (FLEXERIL) 10 MG tablet  3 times daily PRN     09/25/19 1447    ibuprofen (ADVIL) 600 MG tablet  Every 8 hours PRN     09/25/19 1447  Note:  This document was prepared using Dragon voice recognition software and may include unintentional dictation errors.    Sable Feil, [name] 09/25/19 1447    Duffy Bruce, MD 09/25/19 [phone]

## 2019-09-25 NOTE — Discharge Instructions (Signed)
Follow discharge care instruction take medication as directed.  Be advised medicine may cause drowsiness.

## 2019-09-25 NOTE — ED Notes (Signed)
See triage note  Presents with mid back pain  States pain started last Friday  Denies any specific injury but states she was helping her son move stuff up in attic

## 2019-10-06 ENCOUNTER — Encounter: Payer: Self-pay | Admitting: Family Medicine

## 2019-10-06 ENCOUNTER — Other Ambulatory Visit: Payer: Self-pay

## 2019-10-06 ENCOUNTER — Ambulatory Visit
Admission: RE | Admit: 2019-10-06 | Discharge: 2019-10-06 | Disposition: A | Discharge status: Home / Self Care | Payer: BC Managed Care – PPO | Source: Ambulatory Visit | Attending: Family Medicine | Admitting: Family Medicine

## 2019-10-06 ENCOUNTER — Ambulatory Visit (INDEPENDENT_AMBULATORY_CARE_PROVIDER_SITE_OTHER): Payer: BC Managed Care – PPO | Admitting: Family Medicine

## 2019-10-06 VITALS — BP 132/82 | HR 99 | Temp 97.9°F | Resp 14 | Ht 62.0 in | Wt 112.5 lb

## 2019-10-06 DIAGNOSIS — M545 Low back pain, unspecified: Secondary | ICD-10-CM

## 2019-10-06 DIAGNOSIS — M546 Pain in thoracic spine: Secondary | ICD-10-CM | POA: Insufficient documentation

## 2019-10-06 DIAGNOSIS — Z09 Encounter for follow-up examination after completed treatment for conditions other than malignant neoplasm: Secondary | ICD-10-CM

## 2019-10-06 DIAGNOSIS — M810 Age-related osteoporosis without current pathological fracture: Secondary | ICD-10-CM | POA: Diagnosis present

## 2019-10-06 DIAGNOSIS — Z5181 Encounter for therapeutic drug level monitoring: Secondary | ICD-10-CM

## 2019-10-06 DIAGNOSIS — E559 Vitamin D deficiency, unspecified: Secondary | ICD-10-CM

## 2019-10-06 DIAGNOSIS — Z780 Asymptomatic menopausal state: Secondary | ICD-10-CM

## 2019-10-06 HISTORY — DX: Age-related osteoporosis without current pathological fracture: M81.0

## 2019-10-06 MED ORDER — NAPROXEN 500 MG PO TABS: 500.0000 mg | ORAL_TABLET | Freq: Two times a day (BID) | ORAL | 0 refills | Status: DC

## 2019-10-06 MED ORDER — TIZANIDINE HCL 2 MG PO TABS: 2.0000 mg | ORAL_TABLET | Freq: Three times a day (TID) | ORAL | 0 refills | Status: DC | PRN

## 2019-10-06 NOTE — Progress Notes (Signed)
Patient ID: Angela Gordon, female    DOB: 10/31/1971, 48 y.o.   MRN: [id number]  PCP: [facility]  Chief Complaint  Patient presents with   Back Pain    follow-up ER, carrying/pushing heavy totes in attic 1 week ago    Subjective:   Angela Gordon is a 48 y.o. female, presents to clinic with CC of the following:  Back Pain This is a new problem. Episode onset: 2 weeks. The problem occurs daily. The problem has been gradually improving since onset. The pain is present in the thoracic spine and lumbar spine. The quality of the pain is described as aching (tight, pinching or sharp pain). Radiates to: down back. The pain is at a severity of 10/10. The pain is worse during the night (worse to lunch time and evening). Exacerbated by: upper arm/back use making worse. Stiffness is present at night. Associated symptoms include weakness (arm and hands w/some weakness, can't open stuff). Pertinent negatives include no abdominal pain, bladder incontinence, bowel incontinence, chest pain, dysuria, fever, leg pain, numbness, paresis, paresthesias, pelvic pain, perianal numbness, tingling or weight loss. Risk factors include history of osteoporosis. She has tried muscle relaxant (tramadol, aleve) for the symptoms. The treatment provided mild relief.    ER visit sept and oct, hip and leg pain in sept and then mid to low back pain last week, onset almost 2 weeks ago -after she was lifting and pushing heavy box above her head.  There was no imaging done in the ER, she was sent home with fairly high-dose muscle relaxers and tramadol in the ER she was also given a shot of muscle relaxers and Dilaudid -unclear why medications are so strong for such a small patient.  She endorses gradual improvement but she is tight in her upper shoulders pain regardless of what she does at night during the day or regardless of what medication she takes gradually progresses from very manageable in the  mornings to very severe in the afternoons and evenings.  She has cut down the Flexeril medicine and the pain medicine to only once a day but she did run out yesterday   Patient does have a history of osteoporosis and bone scan was last done 3 years ago, reviewed the images and past visits today.   Osteoporosis -   Patient denies history of fracture.The cause of osteoporosis is felt to be due to postmenopausal estrogen deficiency and hereditary factors.   She is currently being treated with calcium and vitamin D supplementation.  She is currently being treated with bisphosphonates  -patient states that she has been on bisphosphonates for the past 3 years but has not really seen anyone to manage it.  She has had changing PCPs here at this clinic over the past several years.  She mentioned that she has seen rheumatology before for arthritis and bone health and that was several years ago and he told her that she had the bones of a 48 year old woman. 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 Take with a full glass of water on an empty stomach., Disp: 52 tablet, Rfl: 0   cyclobenzaprine (FLEXERIL) 10 MG tablet, Take 1 tablet (10 mg total) by mouth 3 (three) times daily as needed., Disp: 15 tablet, Rfl: 0   ibuprofen (ADVIL) 600 MG tablet, Take 1 tablet (600 mg total) by mouth every 8 (eight) hours as needed., Disp: 15 tablet, Rfl: 0   traMADol (ULTRAM) 50 MG tablet,  Take 1 tablet (50 mg total) by mouth every 6 (six) hours as needed., Disp: 20 tablet, Rfl: 0   Allergies  Allergen Reactions   Erythromycin Base Other (See Comments)   Penicillins Rash and Diarrhea   Scallops [Shellfish Allergy] Anaphylaxis   Erythromycin      Family History  Problem Relation Age of Onset   Hypertension Mother    Osteoporosis Mother    Cervical cancer Mother        Hysterectomy in 50s.   Stroke Mother    Sickle cell trait Father    Sickle cell trait Sister    Heart disease Maternal Aunt    Breast cancer Maternal Aunt    Heart disease Maternal Uncle    Hypertension Maternal Uncle    Ovarian cancer Paternal Aunt 70   Heart disease Maternal Grandmother    Hypertension Maternal Grandmother    Osteoporosis Maternal Grandmother    Rheum arthritis Maternal Grandfather    Hypertension Maternal Grandfather    Leukemia Cousin      Social History   Socioeconomic History   Marital status: Married    Spouse name: Not on file   Number of children: Not on file   Years of education: Not on file   Highest education level: Not on file  Occupational History   Not on file  Social Needs   Financial resource strain: Not on file   Food insecurity    Worry: Not on file    Inability: Not on file   Transportation needs    Medical: Not on file    Non-medical: Not on file  Tobacco Use   Smoking status: Never Smoker   Smokeless tobacco: Never Used  Substance and Sexual Activity   Alcohol use: Yes    Alcohol/week: 2.0 standard drinks    Types: 2 Glasses of wine per week    Comment: occasional   Drug use: No   Sexual activity: Yes    Birth control/protection: Surgical  Lifestyle   Physical activity    Days per week: Not on file    Minutes per session: Not on file   Stress: Not on file  Relationships   Social connections    Talks on phone: Not on file    Gets together: Not on file    Attends religious service: Not on file      Active member of club or organization: Not on file    Attends meetings of clubs or organizations: Not on file    Relationship status: Not on file   Intimate partner violence    Fear of current or ex partner: Not on file    Emotionally abused: Not on file    Physically abused: Not on file    Forced sexual activity: Not on file  Other Topics Concern   Not on file  Social History Narrative   Not on file    I personally reviewed active problem list, medication list, allergies, family history, social history,  health maintenance, notes from last several encounters, lab results, imaging with the patient/caregiver today.  Review of Systems  Constitutional: Negative for fever and weight loss.  Cardiovascular: Negative for chest pain.  Gastrointestinal: Negative for abdominal pain and bowel incontinence.  Genitourinary: Negative for bladder incontinence, dysuria and pelvic pain.  Musculoskeletal: Positive for back pain.  Neurological: Positive for weakness (arm and hands w/some weakness, can't open stuff). Negative for tingling, numbness and paresthesias.       Objective:   Vitals:   10/06/19 0936  BP: 132/82  Pulse: 99  Resp: 14  Temp: 97.9 F (36.6 C)  SpO2: 96%  Weight: 112 lb 8 oz (51 kg)  Height: 5\' 2"  (1.575 m)    Body mass index is 20.58 kg/m.  Physical Exam Vitals signs and nursing note reviewed.  Constitutional:      General: She is not in acute distress.    Appearance: Normal appearance. She is well-developed. She is not ill-appearing, toxic-appearing or diaphoretic.     Interventions: Face mask in place.  HENT:     Head: Normocephalic and atraumatic.     Right Ear: External ear normal.     Left Ear: External ear normal.  Eyes:     General: Lids are normal. No scleral icterus.       Right eye: No discharge.        Left eye: No discharge.     Conjunctiva/sclera: Conjunctivae normal.  Neck:     Musculoskeletal: Normal range of motion and neck supple.      Trachea: Phonation normal. No tracheal deviation.  Cardiovascular:     Rate and Rhythm: Normal rate and regular rhythm.     Pulses: Normal pulses.          Radial pulses are 2+ on the right side and 2+ on the left side.       Posterior tibial pulses are 2+ on the right side and 2+ on the left side.     Heart sounds: Normal heart sounds. No murmur. No friction rub. No gallop.   Pulmonary:     Effort: Pulmonary effort is normal. No respiratory distress.     Breath sounds: Normal breath sounds. No stridor. No wheezing, rhonchi or rales.  Chest:     Chest wall: No tenderness.  Abdominal:     General: Bowel sounds are normal. There is no distension.     Palpations: Abdomen is soft.     Tenderness: There is no abdominal tenderness. There is no guarding or rebound.  Musculoskeletal:        General: No deformity.     Cervical back: She exhibits tenderness. She exhibits normal range of motion and no bony tenderness.     Thoracic back: She exhibits tenderness. She exhibits normal range of motion, no bony tenderness, no swelling, no edema, no deformity and no spasm.     Lumbar back: She exhibits normal range of motion, no bony tenderness, no swelling, no edema and no spasm.     Right lower leg: No edema.     Left lower leg: No edema.     Comments: Increased muscle tension in b/l trapezius No midline tenderness from cervical to lumbar spine, no step off diffuse paraspinal muscle ttp to thoracic to lumbar spine  Mildly limited ROM of back with twisting, good flexion and extension 5/5 grip strength  Grossly normal sensation to light touch to B/L UE +SLR on right, - SLR on left  Lymphadenopathy:  Cervical: No cervical adenopathy.  Skin:    General: Skin is warm and dry.     Capillary Refill: Capillary refill takes less than 2 seconds.     Coloration: Skin is not jaundiced or pale.     Findings: No rash.  Neurological:     Mental Status: She is alert.     Sensory: No sensory deficit.      Motor: No weakness or abnormal muscle tone.     Coordination: Coordination normal.     Gait: Gait normal.  Psychiatric:        Mood and Affect: Mood normal.        Speech: Speech normal.        Behavior: Behavior normal.      Results for orders placed or performed in visit on 02/21/18  VITAMIN D 25 Hydroxy (Vit-D Deficiency, Fractures)  Result Value Ref Range   Vit D, 25-Hydroxy 23 (L) 30 - 100 ng/mL  COMPLETE METABOLIC PANEL WITH GFR  Result Value Ref Range   Glucose, Bld 84 65 - 139 mg/dL   BUN 16 7 - 25 mg/dL   Creat 0.85 0.50 - 1.10 mg/dL   GFR, Est Non African American 83 > OR = 60 mL/min/1.73m2   GFR, Est African American 96 > OR = 60 mL/min/1.50m2   BUN/Creatinine Ratio NOT APPLICABLE 6 - 22 (calc)   Sodium 141 135 - 146 mmol/L   Potassium 4.4 3.5 - 5.3 mmol/L   Chloride 107 98 - 110 mmol/L   CO2 27 20 - 32 mmol/L   Calcium 9.0 8.6 - 10.2 mg/dL   Total Protein 6.1 6.1 - 8.1 g/dL   Albumin 4.0 3.6 - 5.1 g/dL   Globulin 2.1 1.9 - 3.7 g/dL (calc)   AG Ratio 1.9 1.0 - 2.5 (calc)   Total Bilirubin 0.7 0.2 - 1.2 mg/dL   Alkaline phosphatase (APISO) 50 33 - 115 U/L   AST 20 10 - 35 U/L   ALT 17 6 - 29 U/L  Phosphorus  Result Value Ref Range   Phosphorus 2.6 2.5 - 4.5 mg/dL  CBC with Differential/Platelet  Result Value Ref Range   WBC 6.2 3.8 - 10.8 Thousand/uL   RBC 4.25 3.80 - 5.10 Million/uL   Hemoglobin 13.2 11.7 - 15.5 g/dL   HCT 39.0 35.0 - 45.0 %   MCV 91.8 80.0 - 100.0 fL   MCH 31.1 27.0 - 33.0 pg   MCHC 33.8 32.0 - 36.0 g/dL   RDW 12.0 11.0 - 15.0 %   Platelets 209 140 - 400 Thousand/uL   MPV 10.2 7.5 - 12.5 fL   Neutro Abs 4,055 1,500 - 7,800 cells/uL   Lymphs Abs 1,544 850 - 3,900 cells/uL   WBC mixed population 434 200 - 950 cells/uL   Eosinophils Absolute 118 15 - 500 cells/uL   Basophils Absolute 50 0 - 200 cells/uL   Neutrophils Relative % 65.4 %   Total Lymphocyte 24.9 %   Monocytes Relative 7.0 %   Eosinophils Relative 1.9 %   Basophils  Relative 0.8 %  TSH  Result Value Ref Range   TSH 1.08 mIU/L        Assessment & Plan:   1. Acute bilateral low back pain, unspecified whether sciatica present Suspect muscular strain to thoracic and lumbar spine with some radiculopathy, has been improving over the past 10 days since time of strain, expect it to continue to improve over the next couple weeks.  Avoid further strain, heavy  lifting, NSAIDs, lower dose muscle relaxer PRN, gentle stretching or massage, heat therapy, screening xrays due to hx of severe osteoporosis Has appt in one month will f/up then PT may be helpful? - DG Lumbar Spine Complete; Future - DG Thoracic Spine W/Swimmers; Future - naproxen (NAPROSYN) 500 MG tablet; Take 1 tablet (500 mg total) by mouth 2 (two) times daily with a meal.  Dispense: 30 tablet; Refill: 0  2. Acute bilateral thoracic back pain Suspect muscle strain she does have a lot of tension in her upper trapezius no midline tenderness, good shoulder movement and mild tenderness to back muscles - DG Thoracic Spine W/Swimmers; Future - naproxen (NAPROSYN) 500 MG tablet; Take 1 tablet (500 mg total) by mouth 2 (two) times daily with a meal.  Dispense: 30 tablet; Refill: 0  3. Osteoporosis, unspecified osteoporosis type, unspecified pathological fracture presence F/up on it- late to do labs, monitoring, repeat dexa - DG Lumbar Spine Complete; Future - CMP w GFR - Vit D - DG Bone Density; Future  4. Vitamin D deficiency recheck - DG Lumbar Spine Complete; Future - Vit D - DG Bone Density; Future  5. Encounter for medication monitoring Check calcium and vit d  6. Postmenopausal estrogen deficiency - DG Bone Density; Future    Delsa Grana, [name] 10/06/19 9:46 AM

## 2019-10-06 NOTE — Patient Instructions (Signed)
Please follow up sooner if pain is getting worse, if nerve pain and weakness is worsening.  May need to get you a prescription of steroids.  But for now I recommend NSAIDS, muscle relaxers, heat therapy, avoiding stress or straining.   Thoracic Strain A thoracic strain is an injury to the muscles or tendons that attach to the upper back. Tendons are tissues that connect muscle to bone. This injury is sometimes called a mid-back strain. A strain can be mild or very bad. A mild strain may take only 1-2 weeks to heal. A very bad strain involves torn muscles or tendons, so it may take 6-8 weeks to heal. What are the causes? This condition may be caused by:  A fall or a hit to the body.  Twisting or stretching the back too far. This may happen when doing activities that require a lot of energy, such as lifting heavy objects. In some cases, the cause may not be known. What increases the risk? This injury is more common in:  Athletes.  People who are very overweight (obese). What are the signs or symptoms?  Pain in the middle back, especially with movement. This is the main symptom.  Stiffness or limited range of motion.  Sudden muscle tightening (spasms). How is this treated? This condition may be treated with:  Resting the injured area.  Putting heat and cold on the injured area.  Medicines for pain and inflammation, such as NSAIDs.  Prescription medicine for pain or to relax the muscles. These may be used for a short time if needed.  Physical therapy. This will involve doing exercises to stretch and strengthen the middle back. Follow these instructions at home: Managing pain, stiffness, and swelling      If told, put ice on the injured area. ? Put ice in a plastic bag. ? Place a towel between your skin and the bag. ? Leave the ice on for 20 minutes, 2-3 times a day.  If told, put heat on the affected area. Do this as often as told by your doctor. Use the heat source that  your doctor recommends, such as a moist heat pack or a heating pad. ? Place a towel between your skin and the heat source. ? Leave the heat on for 20-30 minutes. ? Remove the heat if your skin turns bright red. This is very important if you are unable to feel pain, heat, or cold. You may have a greater risk of getting burned. Activity  Rest and return to your normal activities as told by your doctor. Ask your doctor what activities are safe for you.  Do exercises as told by your doctor. Medicines  Take over-the-counter and prescription medicines only as told by your doctor.  Ask your doctor if the medicine prescribed to you: ? Requires you to avoid driving or using heavy machinery. ? Can cause trouble pooping (constipation). You may need to take steps to prevent or treat trouble pooping:  Drink enough fluid to keep your pee (urine) pale yellow.  Take over-the-counter or prescription medicines.  Eat foods that are high in fiber. These include beans, whole grains, and fresh fruits and vegetables.  Limit foods that are high in fat and processed sugars. These include fried or sweet foods. Injury prevention To prevent a future mid-back injury:  Always warm up before physical activity or sports.  Cool down and stretch after being active.  Use correct form when playing sports and lifting heavy objects. Bend your knees before  you lift heavy objects.  Use good posture when sitting and standing.  Stay physically fit and maintain a healthy weight. ? Do at least 150 minutes of moderate-intensity exercise each week, such as brisk walking or water aerobics. ? Do strength exercises at least 2 times each week.  General instructions  Do not use any products that contain nicotine or tobacco. These products include cigarettes, e-cigarettes, and chewing tobacco. If you need help quitting, ask your doctor.  Keep all follow-up visits as told by your doctor. This is important. Contact a doctor  if:  Your pain is not helped by medicine.  Your pain or stiffness is getting worse.  You have pain or stiffness in your neck or lower back. Get help right away if you:  Have shortness of breath.  Have chest pain.  Have weakness or loss of feeling (numbness) in your legs or arms.  Cannot control when you pee (urinate). Summary  A thoracic strain is an injury to the muscles or tendons that attach to the upper back.  If told, put ice or heat on the affected area.  Rest and return to your normal activities as told by your doctor.  Keep all follow-up visits as told by your doctor. This information is not intended to replace advice given to you by your health care provider. Make sure you discuss any questions you have with your health care provider. Document Released: 05/04/2008 Document Revised: 10/04/2018 Document Reviewed: 10/04/2018 Elsevier Patient Education  2020 [place].   Radicular Pain Radicular pain is a type of pain that spreads from your back or neck along a spinal nerve. Spinal nerves are nerves that leave the spinal cord and go to the muscles. Radicular pain is sometimes called radiculopathy, radiculitis, or a pinched nerve. When you have this type of pain, you may also have weakness, numbness, or tingling in the area of your body that is supplied by the nerve. The pain may feel sharp and burning. Depending on which spinal nerve is affected, the pain may occur in the:  Neck area (cervical radicular pain). You may also feel pain, numbness, weakness, or tingling in the arms.  Mid-spine area (thoracic radicular pain). You would feel this pain in the back and chest. This type is rare.  Lower back area (lumbar radicular pain). You would feel this pain as low back pain. You may feel pain, numbness, weakness, or tingling in the buttocks or legs. Sciatica is a type of lumbar radicular pain that shoots down the back of the leg. Radicular pain occurs when one of the spinal  nerves becomes irritated or squeezed (compressed). It is often caused by something pushing on a spinal nerve, such as one of the bones of the spine (vertebrae) or one of the round cushions between vertebrae (intervertebral disks). This can result from:  An injury.  Wear and tear or aging of a disk.  The growth of a bone spur that pushes on the nerve. Radicular pain often goes away when you follow instructions from your health care provider for relieving pain at home. Follow these instructions at home: Managing pain      If directed, put ice on the affected area: ? Put ice in a plastic bag. ? Place a towel between your skin and the bag. ? Leave the ice on for 20 minutes, 2-3 times a day.  If directed, apply heat to the affected area as often as told by your health care provider. Use the heat source that  your health care provider recommends, such as a moist heat pack or a heating pad. ? Place a towel between your skin and the heat source. ? Leave the heat on for 20-30 minutes. ? Remove the heat if your skin turns bright red. This is especially important if you are unable to feel pain, heat, or cold. You may have a greater risk of getting burned. Activity   Do not sit or rest in bed for long periods of time.  Try to stay as active as possible. Ask your health care provider what type of exercise or activity is best for you.  Avoid activities that make your pain worse, such as bending and lifting.  Do not lift anything that is heavier than 10 lb (4.5 kg), or the limit that you are told, until your health care provider says that it is safe.  Practice using proper technique when lifting items. Proper lifting technique involves bending your knees and rising up.  Do strength and range-of-motion exercises only as told by your health care provider or physical therapist. General instructions  Take over-the-counter and prescription medicines only as told by your health care provider.  Pay  attention to any changes in your symptoms.  Keep all follow-up visits as told by your health care provider. This is important. ? Your health care provider may send you to a physical therapist to help with this pain. Contact a health care provider if:  Your pain and other symptoms get worse.  Your pain medicine is not helping.  Your pain has not improved after a few weeks of home care.  You have a fever. Get help right away if:  You have severe pain, weakness, or numbness.  You have difficulty with bladder or bowel control. Summary  Radicular pain is a type of pain that spreads from your back or neck along a spinal nerve.  When you have radicular pain, you may also have weakness, numbness, or tingling in the area of your body that is supplied by the nerve.  The pain may feel sharp or burning.  Radicular pain may be treated with ice, heat, medicines, or physical therapy. This information is not intended to replace advice given to you by your health care provider. Make sure you discuss any questions you have with your health care provider. Document Released: 12/24/2004 Document Revised: 05/31/2018 Document Reviewed: 05/31/2018 Elsevier Patient Education  [place] therapy can help ease sore, stiff, injured, and tight muscles and joints. Heat relaxes your muscles, which may help ease your pain and muscle spasms. Do not use heat therapy unless your doctor tells you to use it. How to use heat therapy There are several different kinds of heat therapy, including:  Moist heat pack.  Hot water bottle.  Electric heating pad.  Heated gel pack.  Heated wrap.  Warm water bath. Your doctor will tell you how to use heat therapy. In general, you should: 1. Place a towel between your skin and the heat source. 2. Leave the heat on for 20-30 minutes. Your skin may turn pink. 3. Remove the heat if your skin turns bright red. You should remove the heat  source if you are unable to feel pain, heat, or cold. You are more likely to get burned if you leave it on the skin for too long. Your doctor may also tell you to take a warm water bath. To do this: 1. Put a non-slip pad in the bathtub to  prevent a fall. 2. Fill the bathtub with warm water. 3. Check the water temperature. 4. Soak in the water for 15-20 minutes, or as told by your doctor. 5. Be careful when you stand up after the bath. You may feel dizzy. 6. Pat yourself dry after the bath. Do not rub your skin to dry it. General recommendations for heat therapy  Do not sleep while using heat therapy. Only use heat therapy while you are awake.  Your skin may turn pink while using heat therapy. Do not use heat therapy if your skin turns red.  Do not use heat therapy if you have a new injury.  High heat or using heat for a long time can cause burns. Be careful not to burn your skin when using heat therapy.  Do not use heat therapy on areas of your skin that are already irritated, such as with a rash or sunburn. Get help if you have:  Blisters, redness, swelling (puffiness), or numbness.  New pain.  Pain that is getting worse. Summary  Heat therapy is the use of heat to help ease sore, stiff, injured, and tight muscles and joints.  There are different types of heat therapy. Your doctor will tell you which one to use.  Only use heat therapy while you are awake.  Watch your skin to make sure you do not get burned while using heat therapy. This information is not intended to replace advice given to you by your health care provider. Make sure you discuss any questions you have with your health care provider. Document Released: 02/08/2012 Document Revised: 01/09/2019 Document Reviewed: 11/27/2017 Elsevier Patient Education  2020 [employer].   Osteoporosis  Osteoporosis happens when your bones get thin and weak. This can cause your bones to break (fracture) more easily. You can do  things at home to make your bones stronger. Follow these instructions at home:  Activity  Exercise as told by your doctor. Ask your doctor what activities are safe for you. You should do: ? Exercises that make your muscles work to hold your body weight up (weight-bearing exercises). These include tai chi, yoga, and walking. ? Exercises to make your muscles stronger. One example is lifting weights. Lifestyle  Limit alcohol intake to no more than 1 drink a day for nonpregnant women and 2 drinks a day for men. One drink equals 12 oz of beer, 5 oz of wine, or 1 oz of hard liquor.  Do not use any products that have nicotine or tobacco in them. These include cigarettes and e-cigarettes. If you need help quitting, ask your doctor. Preventing falls  Use tools to help you move around (mobility aids) as needed. These include canes, walkers, scooters, and crutches.  Keep rooms well-lit and free of clutter.  Put away things that could make you trip. These include cords and rugs.  Install safety rails on stairs. Install grab bars in bathrooms.  Use rubber mats in slippery areas, like bathrooms.  Wear shoes that: ? Fit you well. ? Support your feet. ? Have closed toes. ? Have rubber soles or low heels.  Tell your doctor about all of the medicines you are taking. Some medicines can make you more likely to fall. General instructions  Eat plenty of calcium and vitamin D. These nutrients are good for your bones. Good sources of calcium and vitamin D include: ? Some fatty fish, such as salmon and tuna. ? Foods that have calcium and vitamin D added to them (fortified foods).  For example, some breakfast cereals are fortified with calcium and vitamin D. ? Egg yolks. ? Cheese. ? Liver.  Take over-the-counter and prescription medicines only as told by your doctor.  Keep all follow-up visits as told by your doctor. This is important. Contact a doctor if:  You have not been tested (screened) for  osteoporosis and you are: ? A woman who is age 76 or older. ? A man who is age 69 or older. Get help right away if:  You fall.  You get hurt. Summary  Osteoporosis happens when your bones get thin and weak.  Weak bones can break (fracture) more easily.  Eat plenty of calcium and vitamin D. These nutrients are good for your bones.  Tell your doctor about all of the medicines that you take. This information is not intended to replace advice given to you by your health care provider. Make sure you discuss any questions you have with your health care provider. Document Released: 02/08/2012 Document Revised: 10/29/2017 Document Reviewed: 09/10/2017 Elsevier Patient Education  2020 [employer].

## 2019-10-07 LAB — COMPLETE METABOLIC PANEL WITH GFR
AG Ratio: 1.7 (calc) (ref 1.0–2.5)
ALT: 29 U/L (ref 6–29)
AST: 22 U/L (ref 10–35)
Albumin: 4.3 g/dL (ref 3.6–5.1)
Alkaline phosphatase (APISO): 57 U/L (ref 31–125)
BUN: 13 mg/dL (ref 7–25)
CO2: 26 mmol/L (ref 20–32)
Calcium: 9.7 mg/dL (ref 8.6–10.2)
Chloride: 103 mmol/L (ref 98–110)
Creat: 0.82 mg/dL (ref 0.50–1.10)
GFR, Est African American: 99 mL/min/{1.73_m2} (ref >=60)
GFR, Est Non African American: 85 mL/min/{1.73_m2} (ref >=60)
Globulin: 2.6 g/dL (calc) (ref 1.9–3.7)
Glucose, Bld: 68 mg/dL (ref 65–99)
Potassium: 4.1 mmol/L (ref 3.5–5.3)
Sodium: 139 mmol/L (ref 135–146)
Total Bilirubin: 0.6 mg/dL (ref 0.2–1.2)
Total Protein: 6.9 g/dL (ref 6.1–8.1)

## 2019-10-07 LAB — VITAMIN D 25 HYDROXY (VIT D DEFICIENCY, FRACTURES): Vit D, 25-Hydroxy: 20 ng/mL — ABNORMAL LOW (ref 30–100)

## 2019-10-09 ENCOUNTER — Other Ambulatory Visit: Payer: Self-pay | Admitting: Family Medicine

## 2019-10-09 DIAGNOSIS — M810 Age-related osteoporosis without current pathological fracture: Secondary | ICD-10-CM

## 2019-10-09 DIAGNOSIS — Z5181 Encounter for therapeutic drug level monitoring: Secondary | ICD-10-CM

## 2019-10-09 DIAGNOSIS — E559 Vitamin D deficiency, unspecified: Secondary | ICD-10-CM

## 2019-10-09 MED ORDER — VITAMIN D (ERGOCALCIFEROL) 1.25 MG (50000 UNIT) PO CAPS: 50000.0000 [IU] | ORAL_CAPSULE | ORAL | 0 refills | Status: DC

## 2019-10-12 ENCOUNTER — Telehealth: Payer: Self-pay

## 2019-10-12 NOTE — Telephone Encounter (Signed)
Copied from [place] [phone]. Topic: General - Call Back - No Documentation >> Oct 12, 2019 10:46 AM Erick Blinks wrote: Reason for CRM: Pt returned missed calls per VM. Requesting fu on bone density, please advise Best contact: [phone]

## 2019-10-18 ENCOUNTER — Other Ambulatory Visit: Payer: Self-pay | Admitting: Family Medicine

## 2019-10-18 DIAGNOSIS — M545 Low back pain, unspecified: Secondary | ICD-10-CM

## 2019-10-18 DIAGNOSIS — M546 Pain in thoracic spine: Secondary | ICD-10-CM

## 2019-11-03 ENCOUNTER — Encounter: Payer: Self-pay | Admitting: Family Medicine

## 2019-11-03 ENCOUNTER — Ambulatory Visit (INDEPENDENT_AMBULATORY_CARE_PROVIDER_SITE_OTHER): Payer: BC Managed Care – PPO | Admitting: Family Medicine

## 2019-11-03 ENCOUNTER — Other Ambulatory Visit: Payer: Self-pay

## 2019-11-03 VITALS — BP 116/84 | HR 100 | Temp 98.1°F | Resp 14 | Ht 62.0 in | Wt 109.8 lb

## 2019-11-03 DIAGNOSIS — M545 Low back pain, unspecified: Secondary | ICD-10-CM

## 2019-11-03 DIAGNOSIS — E559 Vitamin D deficiency, unspecified: Secondary | ICD-10-CM

## 2019-11-03 DIAGNOSIS — Z13228 Encounter for screening for other metabolic disorders: Secondary | ICD-10-CM

## 2019-11-03 DIAGNOSIS — Z130 Encounter for screening for diseases of the blood and blood-forming organs and certain disorders involving the immune mechanism: Secondary | ICD-10-CM | POA: Diagnosis not present

## 2019-11-03 DIAGNOSIS — Z0000 Encounter for general adult medical examination without abnormal findings: Secondary | ICD-10-CM

## 2019-11-03 DIAGNOSIS — Z13220 Encounter for screening for lipoid disorders: Secondary | ICD-10-CM

## 2019-11-03 DIAGNOSIS — Z780 Asymptomatic menopausal state: Secondary | ICD-10-CM

## 2019-11-03 DIAGNOSIS — M546 Pain in thoracic spine: Secondary | ICD-10-CM

## 2019-11-03 DIAGNOSIS — Z1329 Encounter for screening for other suspected endocrine disorder: Secondary | ICD-10-CM | POA: Diagnosis not present

## 2019-11-03 DIAGNOSIS — Z1211 Encounter for screening for malignant neoplasm of colon: Secondary | ICD-10-CM

## 2019-11-03 DIAGNOSIS — M810 Age-related osteoporosis without current pathological fracture: Secondary | ICD-10-CM

## 2019-11-03 MED ORDER — TIZANIDINE HCL 4 MG PO TABS: 4.0000 mg | ORAL_TABLET | Freq: Three times a day (TID) | ORAL | 1 refills | Status: DC | PRN

## 2019-11-03 NOTE — Progress Notes (Signed)
  Patient: Angela Gordon, Female    DOB: 11/25/1971, 48 y.o.   MRN: [id number] , , [name] Visit Date: 11/03/2019  Today's Provider:  , [name]   Chief Complaint  Patient presents with  . Back Pain  . Annual Exam   Subjective:   Annual physical exam:  Angela Gordon is a 48 y.o. female who presents today for complete physical exam:  Exercise/Activity:  Minimal due to ortho issues back pain  Diet/nutrition:  Eats well, eats a lot of vegetables  Sleep:  She gets pretty good sleep, 9p-11p sleeps until 8 or 9 am, generally sleeping good  Pt wished to discuss acute complaints in addition to CPE - explained coding and billing, she continues to have back pain Still very symptomatic with any standing for more than 30 min without it causing severe pain, she can't bend over to wash her hair its painful, she is using muscled relaxers, naproxen prn  She goes to [facility] in [place] for a lot of hormonal tx, had a seed in hip?  Approved through OBGYN she states, the does lab work with them 2x a year, she has an email on her phone for labs With free testosterone, testerone, FSH, free T3, free T4, TSH, TPO antibody, SHBG, DHEA progesterone  Advised pt of separate visit billing/coding  USPSTF grade A and B recommendations - reviewed and addressed today  Depression:  Phq 9 completed today by patient, was reviewed by me with patient in the room, score is  negative, pt feels good overall mood good, no depression no anxiety PHQ 2/9 Scores 11/03/2019 10/06/2019 05/03/2019 02/18/2018  PHQ - 2 Score 0 0 0 0  PHQ- 9 Score 0 0 0 -   Depression screen PHQ 2/9 11/03/2019 10/06/2019 05/03/2019 02/18/2018 05/07/2016  Decreased Interest 0 0 0 0 0  Down, Depressed, Hopeless 0 0 0 0 0  PHQ - 2 Score 0 0 0 0 0  Altered sleeping 0 0 0 - -  Tired, decreased energy 0 0 0 - -  Change in appetite 0 0 0 - -  Feeling bad or failure about yourself  0 0 0 - -  Trouble concentrating 0 0 0 - -   Moving slowly or fidgety/restless 0 0 0 - -  Suicidal thoughts 0 0 0 - -  PHQ-9 Score 0 0 0 - -  Difficult doing work/chores Not difficult at all Not difficult at all Not difficult at all - -    Alcohol screening:   Office Visit from 11/03/2019 in [facility]  AUDIT-C Score  1      Immunizations and Health Maintenance: Health Maintenance  Topic Date Due  . DEXA SCAN  01/29/2018  . INFLUENZA VACCINE  02/28/2020 (Originally 07/01/2019)  . HIV Screening  05/02/2020 (Originally 03/20/1987)  . MAMMOGRAM  09/07/2020  . TETANUS/TDAP  05/08/2028  . PAP SMEAR-Modifier  Discontinued     Hep C Screening: n/a  STD testing and prevention (HIV/chl/gon/syphilis): done previously  Intimate partner violence:feels safe  Sexual History/Pain during Intercourse:  No pain, no concerns, see specialist Married  Menstrual History/LMP/Abnormal Bleeding:  No abnormal bleeding No LMP recorded. Patient has had a hysterectomy.  Incontinence Symptoms: none  Breast cancer:  Last Mammogram: 09/08/2019 BRCA gene screening: family hx - she believes she had negative genetic testing   Cervical cancer screening:  Post hysterectomy Family hx of cancers - breast, ovarian, uterine, colon:  Maternal aunt breast cancer, paternal aunt had   hysterectomy  Osteoporosis:   Discussed high calcium and vitamin D supplementation, weight bearing exercises Pt is supplementing with daily calcium/Vit D. Previously ordered with early menopause -  Bone scan/dexa last done 2017, was on fosamax before  Skin cancer:  Hx of skin CA -  NO Discussed atypical lesions   Colorectal cancer:   colonoscopy is due     Lung cancer:   Low Dose CT Chest recommended if Age 50-80 years, 30 pack-year currently smoking OR have quit w/in 15years. Patient does not qualify.   Social History   Tobacco Use  . Smoking status: Never Smoker  . Smokeless tobacco: Never Used  Substance Use Topics  . Alcohol use: Yes     Alcohol/week: 2.0 standard drinks    Types: 2 Glasses of wine per week    Comment: occasional      ECG:  None today  Blood pressure/Hypertension: BP Readings from Last 3 Encounters:  11/03/19 116/84  10/06/19 132/82  09/25/19 (!) 142/99    Weight/Obesity: Wt Readings from Last 3 Encounters:  11/03/19 109 lb 12.8 oz (49.8 kg)  10/06/19 112 lb 8 oz (51 kg)  09/25/19 115 lb (52.2 kg)   BMI Readings from Last 3 Encounters:  11/03/19 20.08 kg/m  10/06/19 20.58 kg/m  09/25/19 21.03 kg/m     Lipids:  Lab Results  Component Value Date   CHOL 143 01/08/2016   Lab Results  Component Value Date   HDL 59 01/08/2016   Lab Results  Component Value Date   LDLCALC 71 01/08/2016   Lab Results  Component Value Date   TRIG 64 01/08/2016   No results found for: CHOLHDL No results found for: LDLDIRECT Based on the results of lipid panel his/her cardiovascular risk factor ( using [place] )  in the next 10 years is: The ASCVD Risk score Mikey Bussing DC Jr., et al., 2013) failed to calculate for the following reasons:   Cannot find a previous HDL lab   Cannot find a previous total cholesterol lab  Glucose:  Glucose, Bld  Date Value Ref Range Status  10/06/2019 68 65 - 99 mg/dL Final    Comment:    .            Fasting reference interval .   02/21/2018 84 65 - 139 mg/dL Final    Comment:    .        Non-fasting reference interval .       Office Visit from 11/03/2019 in [facility]  AUDIT-C Score  1     Depression: Phq 9 is  Negative - reviewed today Depression screen [facility] 2/9 11/03/2019 10/06/2019 05/03/2019 02/18/2018 05/07/2016  Decreased Interest 0 0 0 0 0  Down, Depressed, Hopeless 0 0 0 0 0  PHQ - 2 Score 0 0 0 0 0  Altered sleeping 0 0 0 - -  Tired, decreased energy 0 0 0 - -  Change in appetite 0 0 0 - -  Feeling bad or failure about yourself  0 0 0 - -  Trouble concentrating 0 0 0 - -  Moving slowly or fidgety/restless 0 0 0 - -  Suicidal  thoughts 0 0 0 - -  PHQ-9 Score 0 0 0 - -  Difficult doing work/chores Not difficult at all Not difficult at all Not difficult at all - -   Hypertension: BP Readings from Last 3 Encounters:  11/03/19 116/84  10/06/19 132/82  09/25/19 (!) 142/99  Obesity: Wt Readings from Last 3 Encounters:  11/03/19 109 lb 12.8 oz (49.8 kg)  10/06/19 112 lb 8 oz (51 kg)  09/25/19 115 lb (52.2 kg)   BMI Readings from Last 3 Encounters:  11/03/19 20.08 kg/m  10/06/19 20.58 kg/m  09/25/19 21.03 kg/m     Social History      She  reports that she has never smoked. She has never used smokeless tobacco. She reports current alcohol use of about 2.0 standard drinks of alcohol per week. She reports that she does not use drugs.       Social History   Socioeconomic History  . Marital status: Married    Spouse name: Not on file  . Number of children: Not on file  . Years of education: Not on file  . Highest education level: Not on file  Occupational History  . Not on file  Social Needs  . Financial resource strain: Not on file  . Food insecurity    Worry: Not on file    Inability: Not on file  . Transportation needs    Medical: Not on file    Non-medical: Not on file  Tobacco Use  . Smoking status: Never Smoker  . Smokeless tobacco: Never Used  Substance and Sexual Activity  . Alcohol use: Yes    Alcohol/week: 2.0 standard drinks    Types: 2 Glasses of wine per week    Comment: occasional  . Drug use: No  . Sexual activity: Yes    Birth control/protection: Surgical  Lifestyle  . Physical activity    Days per week: Not on file    Minutes per session: Not on file  . Stress: Not on file  Relationships  . Social connections    Talks on phone: Not on file    Gets together: Not on file    Attends religious service: Not on file    Active member of club or organization: Not on file    Attends meetings of clubs or organizations: Not on file    Relationship status: Not on file  Other  Topics Concern  . Not on file  Social History Narrative  . Not on file    Family History        Family Status  Relation Name Status  . Mother  Alive  . Father  Alive  . Sister  Alive  . Mat Aunt  Alive  . Mat Uncle  (Not Specified)  . Pat Aunt  Alive  . MGM  (Not Specified)  . MGF  (Not Specified)  . Cousin  (Not Specified)        Her family history includes Breast cancer in her maternal aunt; Cervical cancer in her mother; Heart disease in her maternal aunt, maternal grandmother, and maternal uncle; Hypertension in her maternal grandfather, maternal grandmother, maternal uncle, and mother; Leukemia in her cousin; Osteoporosis in her maternal grandmother and mother; Ovarian cancer (age of onset: 45) in her paternal aunt; Rheum arthritis in her maternal grandfather; Sickle cell trait in her father and sister; Stroke in her mother.       Family History  Problem Relation Age of Onset  . Hypertension Mother   . Osteoporosis Mother   . Cervical cancer Mother        Hysterectomy in 20s.  . Stroke Mother   . Sickle cell trait Father   . Sickle cell trait Sister   . Heart disease Maternal Aunt   . Breast cancer Maternal Aunt   .   Heart disease Maternal Uncle   . Hypertension Maternal Uncle   . Ovarian cancer Paternal Aunt 45  . Heart disease Maternal Grandmother   . Hypertension Maternal Grandmother   . Osteoporosis Maternal Grandmother   . Rheum arthritis Maternal Grandfather   . Hypertension Maternal Grandfather   . Leukemia Cousin     Patient Active Problem List   Diagnosis Date Noted  . Osteoporosis 10/06/2019  . Family history of ovarian cancer 07/26/2017  . Vitamin D deficiency 05/07/2016  . Sickle-cell trait (HCC) 05/07/2016  . 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 ABDOMINAL HYSTERECTOMY  2010   total -Menopausal symptoms, takes HRT  . BREAST EXCISIONAL BIOPSY Left 2003   neg bx dr sankar  . BREAST  SURGERY Left 2004   biopsy, benign  . CYST REMOVAL HAND Right 2005-06  . HERNIA REPAIR  2004  . TONSILLECTOMY  1978     Current Outpatient Medications:  .  albuterol (PROVENTIL) (5 MG/ML) 0.5% nebulizer solution, Take 2.5 mg by nebulization as needed for wheezing or shortness of breath., Disp: , Rfl:  .  alendronate-cholecalciferol (FOSAMAX PLUS D) 70-2800 MG-UNIT tablet, Take 1 tablet by mouth every 7 (seven) days. Take with a full glass of water on an empty stomach., Disp: 52 tablet, Rfl: 0 .  naproxen (NAPROSYN) 500 MG tablet, TAKE 1 TABLET (500 MG TOTAL) BY MOUTH 2 (TWO) TIMES DAILY WITH A MEAL., Disp: 30 tablet, Rfl: 0 .  tiZANidine (ZANAFLEX) 2 MG tablet, Take 1-2 tablets (2-4 mg total) by mouth every 8 (eight) hours as needed for muscle spasms., Disp: 60 tablet, Rfl: 0 .  Vitamin D, Ergocalciferol, (DRISDOL) 1.25 MG (50000 UT) CAPS capsule, Take 1 capsule (50,000 Units total) by mouth every 7 (seven) days. x12 weeks., Disp: 12 capsule, Rfl: 0  Allergies  Allergen Reactions  . Erythromycin Base Other (See Comments)  . Penicillins Rash and Diarrhea  . Scallops [Shellfish Allergy] Anaphylaxis  . Erythromycin     Patient Care Team: , , [name] as PCP - General (Family Medicine)  Review of Systems  Constitutional: Negative.  Negative for activity change, appetite change, fatigue and unexpected weight change.  HENT: Negative.   Eyes: Negative.   Respiratory: Negative.  Negative for shortness of breath.   Cardiovascular: Negative.  Negative for chest pain, palpitations and leg swelling.  Gastrointestinal: Negative.  Negative for abdominal pain and blood in stool.  Endocrine: Negative.   Genitourinary: Negative.   Musculoskeletal: Negative.  Negative for arthralgias, gait problem, joint swelling and myalgias.  Skin: Negative.  Negative for color change, pallor and rash.  Allergic/Immunologic: Negative.   Neurological: Negative.  Negative for syncope and weakness.   Hematological: Negative.   Psychiatric/Behavioral: Negative.  Negative for confusion, dysphoric mood, self-injury and suicidal ideas. The patient is not nervous/anxious.   All other systems reviewed and are negative.    I personally reviewed active problem list, medication list, allergies, family history, social history, health maintenance, notes from last encounter, lab results, imaging with the patient/caregiver today.        Objective:   Vitals:  Vitals:   11/03/19 0913  BP: 116/84  Pulse: 100  Resp: 14  Temp: 98.1 F (36.7 C)  SpO2: 98%  Weight: 109 lb 12.8 oz (49.8 kg)  Height: 5' 2" (1.575 m)    Body mass index is 20.08 kg/m.  Physical Exam Constitutional:      General: She is not in acute distress.      Appearance: Normal appearance. She is well-developed. She is not toxic-appearing or diaphoretic.  HENT:     Head: Normocephalic and atraumatic.     Right Ear: External ear normal.     Left Ear: External ear normal.     Nose: Nose normal.     Mouth/Throat:     Pharynx: Uvula midline.  Eyes:     General: Lids are normal.     Conjunctiva/sclera: Conjunctivae normal.     Pupils: Pupils are equal, round, and reactive to light.  Neck:     Musculoskeletal: Normal range of motion and neck supple.     Trachea: Phonation normal. No tracheal deviation.  Cardiovascular:     Rate and Rhythm: Normal rate and regular rhythm.     Pulses: Normal pulses.          Radial pulses are 2+ on the right side and 2+ on the left side.       Posterior tibial pulses are 2+ on the right side and 2+ on the left side.     Heart sounds: Normal heart sounds. No murmur. No friction rub. No gallop.   Pulmonary:     Effort: Pulmonary effort is normal. No respiratory distress.     Breath sounds: Normal breath sounds. No stridor. No wheezing, rhonchi or rales.  Chest:     Chest wall: No tenderness.  Abdominal:     General: Bowel sounds are normal. There is no distension.     Palpations:  Abdomen is soft.     Tenderness: There is no abdominal tenderness. There is no guarding or rebound.  Musculoskeletal: Normal range of motion.        General: No deformity.  Lymphadenopathy:     Cervical: No cervical adenopathy.  Skin:    General: Skin is warm and dry.     Capillary Refill: Capillary refill takes less than 2 seconds.     Coloration: Skin is not pale.     Findings: No rash.  Neurological:     Mental Status: She is alert and oriented to person, place, and time.     Motor: No abnormal muscle tone.     Gait: Gait normal.  Psychiatric:        Speech: Speech normal.        Behavior: Behavior normal.       Fall Risk: Fall Risk  11/03/2019 10/06/2019 05/03/2019 02/18/2018 05/07/2016  Falls in the past year? 1 0 0 No No  Number falls in past yr: 1 0 - - -  Injury with Fall? 1 0 - - -  Follow up - - - - -    Functional Status Survey: Is the patient deaf or have difficulty hearing?: No Does the patient have difficulty seeing, even when wearing glasses/contacts?: No Does the patient have difficulty concentrating, remembering, or making decisions?: No Does the patient have difficulty walking or climbing stairs?: No Does the patient have difficulty dressing or bathing?: No Does the patient have difficulty doing errands alone such as visiting a doctor's office or shopping?: No   Assessment & Plan:    CPE completed today  . USPSTF grade A and B recommendations reviewed with patient; age-appropriate recommendations, preventive care, screening tests, etc discussed and encouraged; healthy living encouraged; see AVS for patient education given to patient  . Discussed importance of 150 minutes of physical activity weekly, AHA exercise recommendations given to pt in AVS/handout  . Discussed importance of healthy diet:  eating lean meats and proteins,  avoiding trans fats and saturated fats, avoid simple sugars and excessive carbs in diet, eat 6 servings of fruit/vegetables daily and  drink plenty of water and avoid sweet beverages.    . Recommended pt to do annual eye exam and routine dental exams/cleanings  . Depression, alcohol, fall screening completed as documented above and per flowsheets  . Reviewed Health Maintenance: Health Maintenance  Topic Date Due  . DEXA SCAN  01/29/2018  . INFLUENZA VACCINE  02/28/2020 (Originally 07/01/2019)  . HIV Screening  05/02/2020 (Originally 03/20/1987)  . MAMMOGRAM  09/07/2020  . TETANUS/TDAP  05/08/2028  . PAP SMEAR-Modifier  Discontinued    . Immunizations: Immunization History  Administered Date(s) Administered  . Influenza, Seasonal, Injecte, Preservative Fre 12/13/2014  . Influenza,inj,Quad PF,6+ Mos 11/25/2017, 10/21/2018  . Influenza-Unspecified 10/10/2015  . Tdap 05/08/2018     1. Adult general medical exam - Lipid Panel - CBC w/ Diff  2. Screening for lipoid disorders - Lipid Panel  3. Screening for endocrine, metabolic and immunity disorder - Lipid Panel - CBC w/ Diff  4. Screening for deficiency anemia - CBC w/ Diff  5. Vitamin D deficiency Hx of low Vit D, recheck - also hx of osteoporosis  6. Osteoporosis, unspecified osteoporosis type, unspecified pathological fracture presence - Ambulatory referral to Physical Therapy - Ambulatory referral to Orthopedics  7. Acute bilateral low back pain, unspecified whether sciatica present Pain not entirely consistent with exam - good ROM and movement in the room, no midline tenderness, neg SLR, sx severe with any short periods of time immobile, sitting or standing for more than 30 min Feel she need PT and specialist eval - tiZANidine (ZANAFLEX) 4 MG tablet; Take 1 tablet (4 mg total) by mouth every 8 (eight) hours as needed for muscle spasms.  Dispense: 90 tablet; Refill: 1 - Ambulatory referral to Physical Therapy - Ambulatory referral to Orthopedics  8. Postmenopausal estrogen deficiency  9. Screening for malignant neoplasm of colon - Ambulatory  referral to Gastroenterology  10. Acute bilateral thoracic back pain Upper right rhomboid area chronic pain complaints - Ambulatory referral to Physical Therapy - Ambulatory referral to Orthopedics      , [name] 11/03/19 9:29 AM  [facility] 

## 2019-11-03 NOTE — Patient Instructions (Signed)
Preventive Care 40-47 Years Old, Female Preventive care refers to visits with your health care provider and lifestyle choices that can promote health and wellness. This includes:  A yearly physical exam. This may also be called an annual well check.  Regular dental visits and eye exams.  Immunizations.  Screening for certain conditions.  Healthy lifestyle choices, such as eating a healthy diet, getting regular exercise, not using drugs or products that contain nicotine and tobacco, and limiting alcohol use. What can I expect for my preventive care visit? Physical exam Your health care provider will check your:  Height and weight. This may be used to calculate body mass index (BMI), which tells if you are at a healthy weight.  Heart rate and blood pressure.  Skin for abnormal spots. Counseling Your health care provider may ask you questions about your:  Alcohol, tobacco, and drug use.  Emotional well-being.  Home and relationship well-being.  Sexual activity.  Eating habits.  Work and work environment.  Method of birth control.  Menstrual cycle.  Pregnancy history. What immunizations do I need?  Influenza (flu) vaccine  This is recommended every year. Tetanus, diphtheria, and pertussis (Tdap) vaccine  You may need a Td booster every 10 years. Varicella (chickenpox) vaccine  You may need this if you have not been vaccinated. Zoster (shingles) vaccine  You may need this after age 60. Measles, mumps, and rubella (MMR) vaccine  You may need at least one dose of MMR if you were born in 1957 or later. You may also need a second dose. Pneumococcal conjugate (PCV13) vaccine  You may need this if you have certain conditions and were not previously vaccinated. Pneumococcal polysaccharide (PPSV23) vaccine  You may need one or two doses if you smoke cigarettes or if you have certain conditions. Meningococcal conjugate (MenACWY) vaccine  You may need this if you  have certain conditions. Hepatitis A vaccine  You may need this if you have certain conditions or if you travel or work in places where you may be exposed to hepatitis A. Hepatitis B vaccine  You may need this if you have certain conditions or if you travel or work in places where you may be exposed to hepatitis B. Haemophilus influenzae type b (Hib) vaccine  You may need this if you have certain conditions. Human papillomavirus (HPV) vaccine  If recommended by your health care provider, you may need three doses over 6 months. You may receive vaccines as individual doses or as more than one vaccine together in one shot (combination vaccines). Talk with your health care provider about the risks and benefits of combination vaccines. What tests do I need? Blood tests  Lipid and cholesterol levels. These may be checked every 5 years, or more frequently if you are over 50 years old.  Hepatitis C test.  Hepatitis B test. Screening  Lung cancer screening. You may have this screening every year starting at age 55 if you have a 30-pack-year history of smoking and currently smoke or have quit within the past 15 years.  Colorectal cancer screening. All adults should have this screening starting at age 50 and continuing until age 75. Your health care provider may recommend screening at age 45 if you are at increased risk. You will have tests every 1-10 years, depending on your results and the type of screening test.  Diabetes screening. This is done by checking your blood sugar (glucose) after you have not eaten for a while (fasting). You may have this   done every 1-3 years.  Mammogram. This may be done every 1-2 years. Talk with your health care provider about when you should start having regular mammograms. This may depend on whether you have a family history of breast cancer.  BRCA-related cancer screening. This may be done if you have a family history of breast, ovarian, tubal, or peritoneal  cancers.  Pelvic exam and Pap test. This may be done every 3 years starting at age 69. Starting at age 32, this may be done every 5 years if you have a Pap test in combination with an HPV test. Other tests  Sexually transmitted disease (STD) testing.  Bone density scan. This is done to screen for osteoporosis. You may have this scan if you are at high risk for osteoporosis. Follow these instructions at home: Eating and drinking  Eat a diet that includes fresh fruits and vegetables, whole grains, lean protein, and low-fat dairy.  Take vitamin and mineral supplements as recommended by your health care provider.  Do not drink alcohol if: ? Your health care provider tells you not to drink. ? You are pregnant, may be pregnant, or are planning to become pregnant.  If you drink alcohol: ? Limit how much you have to 0-1 drink a day. ? Be aware of how much alcohol is in your drink. In the [place] one drink equals one 12 oz bottle of beer (355 mL), one 5 oz glass of wine (148 mL), or one 1 oz glass of hard liquor (44 mL). Lifestyle  Take daily care of your teeth and gums.  Stay active. Exercise for at least 30 minutes on 5 or more days each week.  Do not use any products that contain nicotine or tobacco, such as cigarettes, e-cigarettes, and chewing tobacco. If you need help quitting, ask your health care provider.  If you are sexually active, practice safe sex. Use a condom or other form of birth control (contraception) in order to prevent pregnancy and STIs (sexually transmitted infections).  If told by your health care provider, take low-dose aspirin daily starting at age 66. What's next?  Visit your health care provider once a year for a well check visit.  Ask your health care provider how often you should have your eyes and teeth checked.  Stay up to date on all vaccines. This information is not intended to replace advice given to you by your health care provider. Make sure you  discuss any questions you have with your health care provider. Document Released: 12/13/2015 Document Revised: 07/28/2018 Document Reviewed: 07/28/2018 Elsevier Patient Education  2020 [employer].   Osteoporosis  Osteoporosis happens when your bones get thin and weak. This can cause your bones to break (fracture) more easily. You can do things at home to make your bones stronger. Follow these instructions at home:  Activity  Exercise as told by your doctor. Ask your doctor what activities are safe for you. You should do: ? Exercises that make your muscles work to hold your body weight up (weight-bearing exercises). These include tai chi, yoga, and walking. ? Exercises to make your muscles stronger. One example is lifting weights. Lifestyle  Limit alcohol intake to no more than 1 drink a day for nonpregnant women and 2 drinks a day for men. One drink equals 12 oz of beer, 5 oz of wine, or 1 oz of hard liquor.  Do not use any products that have nicotine or tobacco in them. These include cigarettes and e-cigarettes. If you need  help quitting, ask your doctor. Preventing falls  Use tools to help you move around (mobility aids) as needed. These include canes, walkers, scooters, and crutches.  Keep rooms well-lit and free of clutter.  Put away things that could make you trip. These include cords and rugs.  Install safety rails on stairs. Install grab bars in bathrooms.  Use rubber mats in slippery areas, like bathrooms.  Wear shoes that: ? Fit you well. ? Support your feet. ? Have closed toes. ? Have rubber soles or low heels.  Tell your doctor about all of the medicines you are taking. Some medicines can make you more likely to fall. General instructions  Eat plenty of calcium and vitamin D. These nutrients are good for your bones. Good sources of calcium and vitamin D include: ? Some fatty fish, such as salmon and tuna. ? Foods that have calcium and vitamin D added to them  (fortified foods). For example, some breakfast cereals are fortified with calcium and vitamin D. ? Egg yolks. ? Cheese. ? Liver.  Take over-the-counter and prescription medicines only as told by your doctor.  Keep all follow-up visits as told by your doctor. This is important. Contact a doctor if:  You have not been tested (screened) for osteoporosis and you are: ? A woman who is age 3 or older. ? A man who is age 77 or older. Get help right away if:  You fall.  You get hurt. Summary  Osteoporosis happens when your bones get thin and weak.  Weak bones can break (fracture) more easily.  Eat plenty of calcium and vitamin D. These nutrients are good for your bones.  Tell your doctor about all of the medicines that you take. This information is not intended to replace advice given to you by your health care provider. Make sure you discuss any questions you have with your health care provider. Document Released: 02/08/2012 Document Revised: 10/29/2017 Document Reviewed: 09/10/2017 Elsevier Patient Education  2020 [employer].

## 2019-11-06 ENCOUNTER — Encounter: Payer: Self-pay | Admitting: Family Medicine

## 2019-11-06 ENCOUNTER — Ambulatory Visit
Admission: RE | Admit: 2019-11-06 | Discharge: 2019-11-06 | Disposition: A | Discharge status: Home / Self Care | Payer: BC Managed Care – PPO | Source: Ambulatory Visit | Attending: Family Medicine | Admitting: Family Medicine

## 2019-11-06 DIAGNOSIS — M810 Age-related osteoporosis without current pathological fracture: Secondary | ICD-10-CM | POA: Diagnosis not present

## 2019-11-06 DIAGNOSIS — E559 Vitamin D deficiency, unspecified: Secondary | ICD-10-CM | POA: Insufficient documentation

## 2019-11-06 DIAGNOSIS — Z780 Asymptomatic menopausal state: Secondary | ICD-10-CM

## 2019-11-10 ENCOUNTER — Other Ambulatory Visit: Payer: Self-pay

## 2019-11-10 ENCOUNTER — Telehealth: Payer: Self-pay

## 2019-11-10 DIAGNOSIS — Z1211 Encounter for screening for malignant neoplasm of colon: Secondary | ICD-10-CM

## 2019-11-10 DIAGNOSIS — M546 Pain in thoracic spine: Secondary | ICD-10-CM

## 2019-11-10 DIAGNOSIS — M545 Low back pain, unspecified: Secondary | ICD-10-CM

## 2019-11-10 MED ORDER — NA SULFATE-K SULFATE-MG SULF 17.5-3.13-1.6 GM/177ML PO SOLN: 1.0000 | Freq: Once | ORAL | 0 refills | Status: AC

## 2019-11-10 MED ORDER — NAPROXEN 500 MG PO TABS: 500.0000 mg | ORAL_TABLET | Freq: Two times a day (BID) | ORAL | 0 refills | Status: DC

## 2019-11-10 NOTE — Telephone Encounter (Signed)
Gastroenterology Pre-Procedure Review  Request Date: Wed 11/29/19 Requesting Physician: Dr. Bonna Gains  PATIENT REVIEW QUESTIONS: The patient responded to the following health history questions as indicated:    1. Are you having any GI issues? no 2. Do you have a personal history of Polyps? no 3. Do you have a family history of Colon Cancer or Polyps? no 4. Diabetes Mellitus? no 5. Joint replacements in the past 12 months?no 6. Major health problems in the past 3 months?no 7. Any artificial heart valves, MVP, or defibrillator?no    MEDICATIONS & ALLERGIES:    Patient reports the following regarding taking any anticoagulation/antiplatelet therapy:   Plavix, Coumadin, Eliquis, Xarelto, Lovenox, Pradaxa, Brilinta, or Effient? no Aspirin? no  Patient confirms/reports the following medications:  Current Outpatient Medications  Medication Sig Dispense Refill  . albuterol (PROVENTIL) (5 MG/ML) 0.5% nebulizer solution Take 2.5 mg by nebulization as needed for wheezing or shortness of breath.    Marland Kitchen alendronate-cholecalciferol (FOSAMAX PLUS D) 70-2800 MG-UNIT tablet Take 1 tablet by mouth every 7 (seven) days. Take with a full glass of water on an empty stomach. 52 tablet 0  . Na Sulfate-K Sulfate-Mg Sulf 17.5-3.13-1.6 GM/177ML SOLN Take 1 kit by mouth once for 1 dose. 354 mL 0  . naproxen (NAPROSYN) 500 MG tablet TAKE 1 TABLET (500 MG TOTAL) BY MOUTH 2 (TWO) TIMES DAILY WITH A MEAL. 30 tablet 0  . tiZANidine (ZANAFLEX) 4 MG tablet Take 1 tablet (4 mg total) by mouth every 8 (eight) hours as needed for muscle spasms. 90 tablet 1  . Vitamin D, Ergocalciferol, (DRISDOL) 1.25 MG (50000 UT) CAPS capsule Take 1 capsule (50,000 Units total) by mouth every 7 (seven) days. x12 weeks. 12 capsule 0   No current facility-administered medications for this visit.    Patient confirms/reports the following allergies:  Allergies  Allergen Reactions  . Erythromycin Base Other (See Comments)  . Penicillins  Rash and Diarrhea  . Scallops [Shellfish Allergy] Anaphylaxis  . Erythromycin     No orders of the defined types were placed in this encounter.   AUTHORIZATION INFORMATION Primary Insurance: 1D#: Group #:  Secondary Insurance: 1D#: Group #:  SCHEDULE INFORMATION: Date: 11/29/19 Time: Location:[facility]

## 2019-11-14 ENCOUNTER — Telehealth: Payer: Self-pay

## 2019-11-14 NOTE — Telephone Encounter (Signed)
Test unable to be added

## 2019-11-16 ENCOUNTER — Other Ambulatory Visit: Payer: Self-pay

## 2019-11-22 ENCOUNTER — Ambulatory Visit: Payer: BC Managed Care – PPO | Attending: Internal Medicine

## 2019-11-22 DIAGNOSIS — Z20822 Contact with and (suspected) exposure to covid-19: Secondary | ICD-10-CM

## 2019-11-22 LAB — CBC WITH DIFFERENTIAL/PLATELET
Absolute Monocytes: 378 cells/uL (ref 200–950)
Basophils Absolute: 49 cells/uL (ref 0–200)
Basophils Relative: 0.7 %
Eosinophils Absolute: 91 cells/uL (ref 15–500)
Eosinophils Relative: 1.3 %
HCT: 44.2 % (ref 35.0–45.0)
Hemoglobin: 14.9 g/dL (ref 11.7–15.5)
Lymphs Abs: 1582 cells/uL (ref 850–3900)
MCH: 31.5 pg (ref 27.0–33.0)
MCHC: 33.7 g/dL (ref 32.0–36.0)
MCV: 93.4 fL (ref 80.0–100.0)
MPV: 10.6 fL (ref 7.5–12.5)
Monocytes Relative: 5.4 %
Neutro Abs: 4900 cells/uL (ref 1500–7800)
Neutrophils Relative %: 70 %
Platelets: 249 10*3/uL (ref 140–400)
RBC: 4.73 10*6/uL (ref 3.80–5.10)
RDW: 12.5 % (ref 11.0–15.0)
Total Lymphocyte: 22.6 %
WBC: 7 10*3/uL (ref 3.8–10.8)

## 2019-11-22 LAB — LIPID PANEL
Cholesterol: 197 mg/dL (ref <200)
HDL: 74 mg/dL (ref >=50)
LDL Cholesterol (Calc): 108 mg/dL (calc) — ABNORMAL HIGH
Non-HDL Cholesterol (Calc): 123 mg/dL (calc) (ref <130)
Total CHOL/HDL Ratio: 2.7 (calc) (ref <5.0)
Triglycerides: 68 mg/dL (ref <150)

## 2019-11-22 LAB — TEST AUTHORIZATION

## 2019-11-22 LAB — DHEA SULFATE,2 SPECIMENS

## 2019-11-22 LAB — TIQ-MISC

## 2019-11-22 LAB — DHEA-SULFATE: DHEA-SO4: 33 ug/dL (ref 19–231)

## 2019-11-23 LAB — NOVEL CORONAVIRUS, NAA: SARS-CoV-2, NAA: NOT DETECTED

## 2019-11-25 ENCOUNTER — Other Ambulatory Visit: Payer: Self-pay | Admitting: Family Medicine

## 2019-11-25 DIAGNOSIS — M545 Low back pain, unspecified: Secondary | ICD-10-CM

## 2019-11-28 ENCOUNTER — Other Ambulatory Visit: Payer: Self-pay

## 2019-11-28 ENCOUNTER — Ambulatory Visit: Payer: BC Managed Care – PPO | Attending: Family Medicine | Admitting: Physical Therapy

## 2019-11-28 ENCOUNTER — Encounter: Payer: Self-pay | Admitting: Physical Therapy

## 2019-11-28 DIAGNOSIS — M546 Pain in thoracic spine: Secondary | ICD-10-CM | POA: Diagnosis not present

## 2019-11-28 NOTE — Therapy (Signed)
[facility] [address], [place], [address] Phone: [phone]   Fax:  [phone]  Physical Therapy Treatment  Patient Details  Name: Angela Gordon MRN: [id number] Date of Birth: 14-Jan-1971 No data recorded  Encounter Date: 11/28/2019    Past Medical History:  Diagnosis Date  . Asthma    Uses rescue inhaler occasionally.  Marland Kitchen History of blood transfusion 1992   Blood Loss from Toxemia of Pregnancy  . Osteoporosis    Strong FHx of Osteoporosis.  Marland Kitchen Scoliosis    Seen Dr. Jefm Bryant  . Sickle-cell trait ([place])   . Stroke [facility]) 1992   Toxemia of Pregnancy    Past Surgical History:  Procedure Laterality Date  . ABDOMINAL HYSTERECTOMY  2010   total -Menopausal symptoms, takes HRT  . BREAST EXCISIONAL BIOPSY Left 2003   neg bx dr Jamal Collin  . BREAST SURGERY Left 2004   biopsy, benign  . CYST REMOVAL HAND Right 2005-06  . HERNIA REPAIR  2004  . TONSILLECTOMY  1978    There were no vitals filed for this visit.     OBJECTIVE  Mental Status Patient is oriented to person, place and time.  Recent memory is intact.  Remote memory is intact.  Attention span and concentration are intact.  Expressive speech is intact.  Patient's fund of knowledge is within normal limits for educational level.  SENSATION: Grossly intact to light touch bilateral LEs as determined by testing dermatomes L2-S2 Proprioception and hot/cold testing deferred on this date   MUSCULOSKELETAL: Tremor: None Bulk: Normal Tone: Normal No visible step-off along spinal column  Posture Excessive lumbar lordosis with compensatory thoracic kyphosis  Very mild thoracic levoscolosis (needed palpation to confirm, almost invisible to eye) Iliac crest height: equal bilaterally Lumbar lateral shift: negative Lower crossed syndrome (tight hip flexors and erector spinae; weak gluts and abs): negative  Gait Normal    Palpation Trigger points with pain reproduction at medial L scapula rhomboid major/mid trap fibers  Observation: good scapulo-humeral rhythm  bilat Strength (out of 5) R/L 5/5 Hip flexion 5/5 Hip ER 5/5 Hip IR 5/5 Hip abduction 5/5 Hip adduction 5/5 Hip extension 5/5 Knee extension 5/5 Knee flexion 5/5 Ankle dorsiflexion 5/5 Ankle plantarflexion 5 Trunk flexion 5 Trunk extension 5/5 Trunk rotation All shoulder strength gross 5/5 bilat 4-/4- Y lower trap 4-/4- T periscapular 4-/4- I position 4/4 latissimus  *Indicates pain   AROM (degrees) All shoulder, hip, lumbar, thoracic motion WNL    PROM (degrees) PROM = AROM Repeated Movements No centralization or peripheralization of symptoms with repeated lumbar extension or flexion.    Muscle Length Hamstrings: WNL bilat [place]:  WNL bilat Thomas:  WNL bilat [place]:  WNL bilat   Passive Accessory Intervertebral Motion (PAIVM) Pt denies reproduction of back pain with CPA L1-L5 and UPA bilaterally L1-L5. Generally hypomobile throughout  Passive Physiological Intervertebral Motion (PPIVM) Normal flexion and extension with PPIVM testing   SPECIAL TESTS Lumbar Radiculopathy and Discogenic: Centralization and Peripheralization (SN 92, -LR 0.12): Negative bilat Slump (SN 83, -LR 0.32): Negative bilat SLR (SN 92, -LR 0.29): Negative bilat Crossed SLR (SP 90): Negative bilat  Facet Joint: Extension-Rotation (SN 100, -LR 0.0): Negative bilat  Lumbar Spinal Stenosis: Lumbar quadrant (SN 70): Negative bilat  Hip: FABER (SN 81): Negative bilat FADIR (SN 94): Negative bilat Hip scour (SN 50):Negative bilat  SIJ:  Thigh Thrust (SN 88, -LR 0.18) : Negative bilat  Piriformis Syndrome: FAIR Test (SN 88,  SP 83): Negative bilat  Therapeutic Activities:  Postural correction in sitting with core activation and scapular retraction with decent carry over, difficulty completing simultaneously, requiring multi-modal  cuing Education with demonstration from patient on sitting with thoracic support with towel roll with core activation to prevent excessive lumbar extension Education on posture with visual aid to demonstrate under and over active musculature and length tension relationship  Ther-Ex Ardine Eng pose R lateral bias 30sec hold Doorway rhomboid stretch 30sec hold                            Patient will benefit from skilled therapeutic intervention in order to improve the following deficits and impairments:     Visit Diagnosis: No diagnosis found.     Problem List Patient Active Problem List   Diagnosis Date Noted  . Osteoporosis 10/06/2019  . Family history of ovarian cancer 07/26/2017  . Vitamin D deficiency 05/07/2016  . Sickle-cell trait ([place]) 05/07/2016  . 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 [place], [address] Phone: [phone]   Fax:  [phone]  Name: Angela Gordon MRN: [id number] Date of Birth: 05-10-1971

## 2019-11-29 ENCOUNTER — Encounter: Payer: Self-pay | Admitting: Gastroenterology

## 2019-11-29 ENCOUNTER — Other Ambulatory Visit: Payer: Self-pay

## 2019-11-29 ENCOUNTER — Ambulatory Visit: Payer: BC Managed Care – PPO | Admitting: Anesthesiology

## 2019-11-29 ENCOUNTER — Ambulatory Visit
Admission: RE | Admit: 2019-11-29 | Discharge: 2019-11-29 | Disposition: A | Discharge status: Home / Self Care | Payer: BC Managed Care – PPO | Attending: Gastroenterology | Admitting: Gastroenterology

## 2019-11-29 ENCOUNTER — Encounter
Admission: RE | Disposition: A | Discharge status: Home / Self Care | Payer: Self-pay | Source: Home / Self Care | Attending: Gastroenterology

## 2019-11-29 DIAGNOSIS — Z1211 Encounter for screening for malignant neoplasm of colon: Secondary | ICD-10-CM | POA: Insufficient documentation

## 2019-11-29 DIAGNOSIS — Z7983 Long term (current) use of bisphosphonates: Secondary | ICD-10-CM | POA: Insufficient documentation

## 2019-11-29 DIAGNOSIS — M810 Age-related osteoporosis without current pathological fracture: Secondary | ICD-10-CM | POA: Insufficient documentation

## 2019-11-29 DIAGNOSIS — K629 Disease of anus and rectum, unspecified: Secondary | ICD-10-CM | POA: Diagnosis not present

## 2019-11-29 DIAGNOSIS — Z8673 Personal history of transient ischemic attack (TIA), and cerebral infarction without residual deficits: Secondary | ICD-10-CM | POA: Insufficient documentation

## 2019-11-29 DIAGNOSIS — K6289 Other specified diseases of anus and rectum: Secondary | ICD-10-CM | POA: Insufficient documentation

## 2019-11-29 HISTORY — PX: COLONOSCOPY WITH PROPOFOL: SHX5780

## 2019-11-29 SURGERY — COLONOSCOPY WITH PROPOFOL
Anesthesia: General

## 2019-11-29 MED ORDER — MIDAZOLAM HCL 2 MG/2ML IJ SOLN
INTRAMUSCULAR | Status: DC | PRN
  Administered 2019-11-29: 1 mg via INTRAVENOUS

## 2019-11-29 MED ORDER — LIDOCAINE HCL (CARDIAC) PF 100 MG/5ML IV SOSY
PREFILLED_SYRINGE | INTRAVENOUS | Status: DC | PRN
  Administered 2019-11-29: 60 mg via INTRAVENOUS

## 2019-11-29 MED ORDER — FENTANYL CITRATE (PF) 100 MCG/2ML IJ SOLN
INTRAMUSCULAR | Status: AC
  Filled 2019-11-29: qty 2

## 2019-11-29 MED ORDER — PROPOFOL 10 MG/ML IV BOLUS
INTRAVENOUS | Status: DC | PRN
  Administered 2019-11-29: 60 mg via INTRAVENOUS

## 2019-11-29 MED ORDER — EPHEDRINE SULFATE 50 MG/ML IJ SOLN
INTRAMUSCULAR | Status: DC | PRN
  Administered 2019-11-29 (×2): 10 mg via INTRAVENOUS

## 2019-11-29 MED ORDER — PROPOFOL 500 MG/50ML IV EMUL
INTRAVENOUS | Status: AC
  Filled 2019-11-29: qty 50

## 2019-11-29 MED ORDER — MIDAZOLAM HCL 2 MG/2ML IJ SOLN
INTRAMUSCULAR | Status: AC
  Filled 2019-11-29: qty 2

## 2019-11-29 MED ORDER — EPHEDRINE SULFATE 50 MG/ML IJ SOLN
INTRAMUSCULAR | Status: AC
  Filled 2019-11-29: qty 1

## 2019-11-29 MED ORDER — FENTANYL CITRATE (PF) 100 MCG/2ML IJ SOLN
INTRAMUSCULAR | Status: DC | PRN
  Administered 2019-11-29: 50 ug via INTRAVENOUS

## 2019-11-29 MED ORDER — SODIUM CHLORIDE 0.9 % IV SOLN: INTRAVENOUS | Status: DC

## 2019-11-29 MED ORDER — PROPOFOL 500 MG/50ML IV EMUL
INTRAVENOUS | Status: DC | PRN
  Administered 2019-11-29: 140 ug/kg/min via INTRAVENOUS

## 2019-11-29 NOTE — Transfer of Care (Signed)
Immediate Anesthesia Transfer of Care Note  Patient: Angela Gordon  Procedure(s) Performed: COLONOSCOPY WITH PROPOFOL (N/A )  Patient Location: PACU  Anesthesia Type:General  Level of Consciousness: sedated  Airway & Oxygen Therapy: Patient Spontanous Breathing and Patient connected to nasal cannula oxygen  Post-op Assessment: Report given to RN and Post -op Vital signs reviewed and stable  Post vital signs: Reviewed and stable  Last Vitals:  Vitals Value Taken Time  BP 105/71 11/29/19 0932  Temp 36.5 C 11/29/19 0931  Pulse 108 11/29/19 0934  Resp 14 11/29/19 0934  SpO2 100 % 11/29/19 0934  Vitals shown include unvalidated device data.  Last Pain:  Vitals:   11/29/19 0931  TempSrc: Temporal  PainSc: Asleep         Complications: No apparent anesthesia complications

## 2019-11-29 NOTE — Anesthesia Preprocedure Evaluation (Signed)
Anesthesia Evaluation  Patient identified by MRN, date of birth, ID band Patient awake    Reviewed: Allergy & Precautions, NPO status , Patient's Chart, lab work & pertinent test results  History of Anesthesia Complications Negative for: history of anesthetic complications  Airway Mallampati: II  TM Distance: >3 FB Neck ROM: Full    Dental no notable dental hx.    Pulmonary asthma ,    breath sounds clear to auscultation- rhonchi (-) wheezing      Cardiovascular Exercise Tolerance: Good (-) hypertension(-) CAD, (-) Past MI, (-) Cardiac Stents and (-) CABG  Rhythm:Regular Rate:Normal - Systolic murmurs and - Diastolic murmurs    Neuro/Psych neg Seizures negative psych ROS   GI/Hepatic negative GI ROS, Neg liver ROS,   Endo/Other  negative endocrine ROSneg diabetes  Renal/GU negative Renal ROS     Musculoskeletal negative musculoskeletal ROS (+)   Abdominal (+) - obese,   Peds  Hematology negative hematology ROS (+)   Anesthesia Other Findings Past Medical History: No date: Asthma     Comment:  Uses rescue inhaler occasionally. 1992: History of blood transfusion     Comment:  Blood Loss from Toxemia of Pregnancy No date: Osteoporosis     Comment:  Strong FHx of Osteoporosis. No date: Scoliosis     Comment:  Seen Dr. Jefm Bryant No date: Sickle-cell trait ([place]) 1992: Stroke ([place])     Comment:  Toxemia of Pregnancy   Reproductive/Obstetrics                             Anesthesia Physical Anesthesia Plan  ASA: II  Anesthesia Plan: General   Post-op Pain Management:    Induction: Intravenous  PONV Risk Score and Plan: 2 and Propofol infusion  Airway Management Planned: Natural Airway  Additional Equipment:   Intra-op Plan:   Post-operative Plan:   Informed Consent: I have reviewed the patients History and Physical, chart, labs and discussed the procedure including the risks,  benefits and alternatives for the proposed anesthesia with the patient or authorized representative who has indicated his/her understanding and acceptance.     Dental advisory given  Plan Discussed with: CRNA and Anesthesiologist  Anesthesia Plan Comments:         Anesthesia Quick Evaluation

## 2019-11-29 NOTE — H&P (Signed)
Vonda Antigua, MD [address], [place], [place], [place], [address] [address], [place], [place], [place], [address] Phone: [phone]  Fax: [phone]  Primary Care Physician:  Delsa Grana, [name]   Pre-Procedure History & Physical: HPI:  Angela Gordon is a 48 y.o. female is here for a colonoscopy.   Past Medical History:  Diagnosis Date  . Asthma    Uses rescue inhaler occasionally.  Marland Kitchen History of blood transfusion 1992   Blood Loss from Toxemia of Pregnancy  . Osteoporosis    Strong FHx of Osteoporosis.  Marland Kitchen Scoliosis    Seen Dr. Jefm Bryant  . Sickle-cell trait ([place])   . Stroke [facility]) 1992   Toxemia of Pregnancy    Past Surgical History:  Procedure Laterality Date  . ABDOMINAL HYSTERECTOMY  2010   total -Menopausal symptoms, takes HRT  . BREAST EXCISIONAL BIOPSY Left 2003   neg bx dr Jamal Collin  . BREAST SURGERY Left 2004   biopsy, benign  . CYST REMOVAL HAND Right 2005-06  . HERNIA REPAIR  2004  . TONSILLECTOMY  1978    Prior to Admission medications   Medication Sig Start Date End Date Taking? Authorizing Provider  albuterol (PROVENTIL) (5 MG/ML) 0.5% nebulizer solution Take 2.5 mg by nebulization as needed for wheezing or shortness of breath.   Yes [provider]  naproxen (NAPROSYN) 500 MG tablet Take 1 tablet (500 mg total) by mouth 2 (two) times daily with a meal. 11/10/19  Yes Delsa Grana, [name]  tiZANidine (ZANAFLEX) 4 MG tablet Take 1 tablet (4 mg total) by mouth every 8 (eight) hours as needed for muscle spasms. 11/03/19  Yes Delsa Grana, [name]  Vitamin D, Ergocalciferol, (DRISDOL) 1.25 MG (50000 UT) CAPS capsule Take 1 capsule (50,000 Units total) by mouth every 7 (seven) days. x12 weeks. 10/09/19  Yes Delsa Grana, [name]  alendronate-cholecalciferol (FOSAMAX PLUS D) 70-2800 MG-UNIT tablet Take 1 tablet by mouth every 7 (seven) days. Take with a full glass of water on an empty stomach. 05/03/19   Fredderick Severance, NP    Allergies as of  11/10/2019 - Review Complete 11/06/2019  Allergen Reaction Noted  . Erythromycin base Other (See Comments)   . Penicillins Rash and Diarrhea 12/06/2015  . Scallops [shellfish allergy] Anaphylaxis 12/06/2015  . Erythromycin  07/13/2017    Family History  Problem Relation Age of Onset  . Hypertension Mother   . Osteoporosis Mother   . Cervical cancer Mother        Hysterectomy in 24s.  . Stroke Mother   . Sickle cell trait Father   . Sickle cell trait Sister   . Heart disease Maternal Aunt   . Breast cancer Maternal Aunt   . Heart disease Maternal Uncle   . Hypertension Maternal Uncle   . Ovarian cancer Paternal Aunt 53  . Heart disease Maternal Grandmother   . Hypertension Maternal Grandmother   . Osteoporosis Maternal Grandmother   . Rheum arthritis Maternal Grandfather   . Hypertension Maternal Grandfather   . Leukemia Cousin     Social History   Socioeconomic History  . Marital status: Married    Spouse name: Not on file  . Number of children: Not on file  . Years of education: Not on file  . Highest education level: Not on file  Occupational History  . Not on file  Tobacco Use  . Smoking status: Never Smoker  . Smokeless tobacco: Never Used  Substance and Sexual Activity  . Alcohol use:  Yes    Alcohol/week: 2.0 standard drinks    Types: 2 Glasses of wine per week    Comment: occasional  . Drug use: No  . Sexual activity: Yes    Birth control/protection: Surgical  Other Topics Concern  . Not on file  Social History Narrative  . Not on file   Social Determinants of Health   Financial Resource Strain:   . Difficulty of Paying Living Expenses: Not on file  Food Insecurity:   . Worried About Charity fundraiser in the Last Year: Not on file  . Ran Out of Food in the Last Year: Not on file  Transportation Needs:   . Lack of Transportation (Medical): Not on file  . Lack of Transportation (Non-Medical): Not on file  Physical Activity:   . Days of Exercise  per Week: Not on file  . Minutes of Exercise per Session: Not on file  Stress:   . Feeling of Stress : Not on file  Social Connections:   . Frequency of Communication with Friends and Family: Not on file  . Frequency of Social Gatherings with Friends and Family: Not on file  . Attends Religious Services: Not on file  . Active Member of Clubs or Organizations: Not on file  . Attends Archivist Meetings: Not on file  . Marital Status: Not on file  Intimate Partner Violence:   . Fear of Current or Ex-Partner: Not on file  . Emotionally Abused: Not on file  . Physically Abused: Not on file  . Sexually Abused: Not on file    Review of Systems: See HPI, otherwise negative ROS  Physical Exam: BP (!) 109/98   Pulse 96   Temp 97.9 F (36.6 C) (Temporal)   Resp 16   Ht 5' 2.5" (1.588 m)   Wt 49.4 kg   BMI 19.62 kg/m  General:   Alert,  pleasant and cooperative in NAD Head:  Normocephalic and atraumatic. Neck:  Supple; no masses or thyromegaly. Lungs:  Clear throughout to auscultation, normal respiratory effort.    Heart:  +S1, +S2, Regular rate and rhythm, No edema. Abdomen:  Soft, nontender and nondistended. Normal bowel sounds, without guarding, and without rebound.   Neurologic:  Alert and  oriented x4;  grossly normal neurologically.  Impression/Plan: Angela Gordon is here for a colonoscopy to be performed for average risk screening.  Risks, benefits, limitations, and alternatives regarding  colonoscopy have been reviewed with the patient.  Questions have been answered.  All parties agreeable.   Virgel Manifold, MD  11/29/2019, 8:19 AM

## 2019-11-29 NOTE — Anesthesia Procedure Notes (Signed)
Date/Time: 11/29/2019 9:05 AM Performed by: Allean Found, CRNA Pre-anesthesia Checklist: Patient identified, Emergency Drugs available, Suction available, Patient being monitored and Timeout performed Patient Re-evaluated:Patient Re-evaluated prior to induction Oxygen Delivery Method: Nasal cannula Placement Confirmation: positive ETCO2

## 2019-11-29 NOTE — Anesthesia Postprocedure Evaluation (Signed)
Anesthesia Post Note  Patient: Angela Gordon  Procedure(s) Performed: COLONOSCOPY WITH PROPOFOL (N/A )  Patient location during evaluation: Endoscopy Anesthesia Type: General Level of consciousness: awake and alert and oriented Pain management: pain level controlled Vital Signs Assessment: post-procedure vital signs reviewed and stable Respiratory status: spontaneous breathing, nonlabored ventilation and respiratory function stable Cardiovascular status: blood pressure returned to baseline and stable Postop Assessment: no signs of nausea or vomiting Anesthetic complications: no     Last Vitals:  Vitals:   11/29/19 0813 11/29/19 0931  BP: (!) 109/98 105/71  Pulse: 96 (!) 116  Resp: 16 15  Temp: 36.6 C 36.5 C  SpO2:  98%    Last Pain:  Vitals:   11/29/19 0951  TempSrc:   PainSc: 0-No pain                 Kairos Panetta

## 2019-11-29 NOTE — Anesthesia Post-op Follow-up Note (Signed)
Anesthesia QCDR form completed.        

## 2019-11-29 NOTE — Op Note (Signed)
[facility] Patient Name: Angela Gordon Procedure Date: 11/29/2019 8:41 AM MRN: [id number] Account #: [id number] Date of Birth: 1971/01/28 Admit Type: Outpatient Age: 48 Room: [facility] ENDO ROOM 2 Gender: Female Note Status: Finalized Procedure:             Colonoscopy Indications:           Screening for colorectal malignant neoplasm Providers:             Sanjuana Mruk B. Bonna Gains MD, MD Referring MD:          Delsa Grana (Referring MD) Medicines:             Monitored Anesthesia Care Complications:         No immediate complications. Procedure:             Pre-Anesthesia Assessment:                        - Prior to the procedure, a History and Physical was                         performed, and patient medications, allergies and                         sensitivities were reviewed. The patient's tolerance                         of previous anesthesia was reviewed.                        - The risks and benefits of the procedure and the                         sedation options and risks were discussed with the                         patient. All questions were answered and informed                         consent was obtained.                        - Patient identification and proposed procedure were                         verified prior to the procedure by the physician, the                         nurse, the anesthetist and the technician. The                         procedure was verified in the pre-procedure area in                         the procedure room in the endoscopy suite.                        - ASA Grade Assessment: II - A patient with mild  systemic disease.                        - After reviewing the risks and benefits, the patient                         was deemed in satisfactory condition to undergo the                         procedure.                        After obtaining informed consent, the  colonoscope was                         passed under direct vision. Throughout the procedure,                         the patient's blood pressure, pulse, and oxygen                         saturations were monitored continuously. The                         Colonoscope was introduced through the anus and                         advanced to the the cecum, identified by appendiceal                         orifice and ileocecal valve. The colonoscopy was                         performed with ease. The patient tolerated the                         procedure well. The quality of the bowel preparation                         was good. Findings:      The perianal and digital rectal examinations were normal.      A non-obstructing mass was found in the rectum. The mass was partially       circumferential (involving one-half of the lumen circumference). No       bleeding was present. Mucosa was biopsied with a cold forceps for       histology.      The sigmoid colon, descending colon, transverse colon, ascending colon       and cecum appeared normal.      The retroflexed view of the distal rectum and anal verge was normal and       showed no anal or rectal abnormalities. 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 Biopsied.                        - The sigmoid colon, descending colon, transverse                         colon, ascending colon and cecum are normal.                        -  The distal rectum and anal verge are normal on                         retroflexion view. Recommendation:        - Await pathology results.                        - Discharge patient to home.                        - Resume previous diet.                        - Continue present medications.                        - Repeat colonoscopy date to be determined after                         pending pathology results are reviewed.                        - Return to primary care physician as previously                          scheduled.                        - The findings and recommendations were discussed with                         the patient.                        - The findings and recommendations were discussed with                         the patient's family. Procedure Code(s):     --- Professional ---                        [phone], Colonoscopy, flexible; diagnostic, including                         collection of specimen(s) by brushing or washing, when                         performed (separate procedure) Diagnosis Code(s):     --- Professional ---                        Z12.11, Encounter for screening for malignant neoplasm                         of colon                        D49.0, Neoplasm of unspecified behavior of digestive                         system CPT copyright 2019 American Medical Association. All rights reserved. The codes documented in this report are preliminary and upon coder review may  be revised to meet current compliance requirements.  Vonda Antigua, MD Margretta Sidle B. Bonna Gains MD, MD 11/29/2019 9:38:26 AM This report has been signed electronically. Number of Addenda: 0 Note Initiated On: 11/29/2019 8:41 AM Scope Withdrawal Time: 0 hours 12 minutes 38 seconds  Total Procedure Duration: 0 hours 18 minutes 56 seconds  Estimated Blood Loss:  Estimated blood loss: none.      [facility]

## 2019-11-30 ENCOUNTER — Encounter: Payer: BC Managed Care – PPO | Admitting: Physical Therapy

## 2019-11-30 ENCOUNTER — Encounter: Payer: Self-pay | Admitting: *Deleted

## 2019-11-30 LAB — SURGICAL PATHOLOGY

## 2019-12-04 ENCOUNTER — Telehealth: Payer: Self-pay

## 2019-12-04 NOTE — Telephone Encounter (Signed)
Faxed referral to [facility]

## 2019-12-04 NOTE — Telephone Encounter (Signed)
Tried to call patient but voicemail is full  

## 2019-12-04 NOTE — Telephone Encounter (Signed)
Tried to call patient but mailbox is full  

## 2019-12-04 NOTE — Telephone Encounter (Signed)
Tried to call voicemail sent mychart message with results

## 2019-12-04 NOTE — Telephone Encounter (Signed)
-----   Message from Virgel Manifold, MD sent at 12/04/2019  1:10 PM EST ----- Caryl Pina please let the patient know, her polyp results showed a benign but inflammatory polyp.  Due to the large size of this polyp, I recommend further evaluation by colorectal surgery, Dr. Nadeen Landau.  Please refer to [facility], Dr. Nadeen Landau for "inflammatory Cap Polyp". Set clinic recall for 3 months

## 2019-12-05 ENCOUNTER — Other Ambulatory Visit: Payer: Self-pay

## 2019-12-05 ENCOUNTER — Encounter: Payer: Self-pay | Admitting: Physical Therapy

## 2019-12-05 ENCOUNTER — Ambulatory Visit: Payer: BC Managed Care – PPO | Attending: Family Medicine | Admitting: Physical Therapy

## 2019-12-05 DIAGNOSIS — M546 Pain in thoracic spine: Secondary | ICD-10-CM | POA: Diagnosis present

## 2019-12-05 NOTE — Therapy (Signed)
[facility] [address] [place], [address] Phone: [phone]   Fax:  [phone]  Physical Therapy Treatment  Patient Details  Name: Angela Gordon MRN: [id number] Date of Birth: [birth year] No data recorded  Encounter Date: 12/05/2019  PT End of Session - 12/05/19 0931    Visit Number  2    Number of Visits  17    Date for PT Re-Evaluation  01/09/20    PT Start Time  0900    PT Stop Time  0945    PT Time Calculation (min)  45 min    Activity Tolerance  Patient tolerated treatment well    Behavior During Therapy  [facility] for tasks assessed/performed       Past Medical History:  Diagnosis Date  . Asthma    Uses rescue inhaler occasionally.  Marland Kitchen History of blood transfusion 1992   Blood Loss from Toxemia of Pregnancy  . Osteoporosis    Strong FHx of Osteoporosis.  Marland Kitchen Scoliosis    Seen Dr. Jefm Bryant  . Sickle-cell trait ([place])   . Stroke [facility]) 1992   Toxemia of Pregnancy    Past Surgical History:  Procedure Laterality Date  . ABDOMINAL HYSTERECTOMY  2010   total -Menopausal symptoms, takes HRT  . BREAST EXCISIONAL BIOPSY Left 2003   neg bx dr Jamal Collin  . BREAST SURGERY Left 2004   biopsy, benign  . COLONOSCOPY WITH PROPOFOL N/A 11/29/2019   Procedure: COLONOSCOPY WITH PROPOFOL;  Surgeon: Virgel Manifold, MD;  Location: [facility] ENDOSCOPY;  Service: Endoscopy;  Laterality: N/A;  . CYST REMOVAL HAND Right 2005-06  . HERNIA REPAIR  2004  . TONSILLECTOMY  1978    There were no vitals filed for this visit.  Subjective Assessment - 12/05/19 0904    Subjective  Patient reprots she hasn't done very much, but that her back has been doing okay. Patient reports she went for a mile walk and her back did bother her the next day. Reports 0/10 pain today.    Pertinent History  Patinet is a 49 year old female presenting with mid and LBP. Patient reports pain started after moving some things around the house into  the attic in August . She heard and felt a pop in her back and has been having some increased pain ever since. Reports pain feels most intense between L scapula and spine and will travel down to low back. Reports pain is sharp without numbess/tingling. Made worse by prolonged standing, sitting (sits at desk chair for work from home), carrying bag on L side, pulling with her LUE for household chores. In sitting, her pain is better when she sits a pillow behind her and she is constantly shifting. Worst pain over last week 8/10; best 2/10. 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 Patient was an avid exerciser in the gym before COVID, but now walks or bikes 3x/week 1mins with daughter. Pt denies N/V, B&B changes, unexplained weight fluctuation, saddle paresthesia, fever, night sweats, or unrelenting night pain at this time. Does report that if she turns to her L side while sleeping it will awaken her but she can go back to sleep. History of osteoporosis and scolosis.    Limitations  Sitting;House hold activities;Standing;Walking    How long can you sit comfortably?  49mins    How long can you stand comfortably?  45-48mins    How long  can you walk comfortably?  45-61mins    Diagnostic tests  10/06/19 Tspine Xray  Minimal levocurvature.  No acute osseous abnormality 10/06/19 Lumbar Xray Negative; bone density scan shows improvement with current treatment    Patient Stated Goals  Decrease pain to complete daily activities.       Manual STM with trigger point release to L mid tspine paraspinals, mid trap/rhomboids, and levator  Following: Dry Needling: (1/1/1) 53mm .30 needles placed along the L levator with pincer grasp, L tspine paraspinal with shelving technique about T8, rhomboid minor with pincer grasp,  to decrease increased muscular spasms and trigger points with the patient positioned in supine. Patient was educated on risks and benefits of therapy and  verbally consents to PT.    Ther-Ex Doorway rhomboid stretch 30sec hold Childs pose 30sec hold; lateral bias 30sec hold Prone Y 3x 8 with minimal lift, good carry over of scapular retraction + depression  Standing row 10# 3x 10 with demo and cuing needed initially for proper technique with good carry over Standing ER at 90/90 2x 8 5# with demo and cuing initially for set up with decent carry over following Seated horizontal abd in 90/90 position 2x 8 4# with some cuing for set up without shoulder hiking with good carry over                      PT Education - 12/05/19 0926    Education Details  therex form; TDN    Person(s) Educated  Patient    Methods  Explanation;Demonstration;Verbal cues    Comprehension  Verbalized understanding;Returned demonstration;Verbal cues required       PT Short Term Goals - 11/28/19 1113      PT SHORT TERM GOAL #1   Title  Pt will be independent with HEP in order to improve strength and decrease back pain in order to improve pain-free function at home and work.    Baseline  11/28/19 HEP given    Time  4    Period  Weeks    Status  New        PT Long Term Goals - 11/28/19 1113      PT LONG TERM GOAL #1   Title  Patient will increase FOTO score to 63 to demonstrate predicted increase in functional mobility to complete ADLs    Baseline  11/28/19 43    Time  6    Period  Weeks    Status  New      PT LONG TERM GOAL #2   Title  Pt will increase periscapular strength of by at least 1/2 MMT grade in order to demonstrate improvement in strength and function.    Baseline  11/28/19 4- bilat Y/T/I 4+ bilat latissumus    Time  6    Period  Weeks    Status  New      PT LONG TERM GOAL #3   Title  Pt will decrease worst back pain as reported on NPRS by at least 2 points in order to demonstrate clinically significant reduction in back pain.    Baseline  11/28/19 8/10    Time  6    Period  Weeks    Status  New      PT LONG TERM  GOAL #4   Title  Patient will report being able to sit for 1 hour zoom meeting without pain/adjusting to be able to maintain focus to complete work duties    Baseline  11/28/19 able to sit for 21mins before pain increases and is distracting    Time  6    Period  Weeks    Status  New            Plan - 12/05/19 [id number]    Clinical Impression Statement  PT utilized manual techniques and TDN to relieve muscle tension with good success. Patient reports she feels" looser" and continues to have no pain following. PT led patient through therex for periscapular activation with good carry over. PT will continue progression as able.    Personal Factors and Comorbidities  Age;Sex;Comorbidity 1;Comorbidity 2;Time since onset of injury/illness/exacerbation    Comorbidities  Osteoporosis, Thoracic levoscolosis    Examination-Activity Limitations  Sit;Sleep;Lift;Carry;Reach Overhead;Stand    Examination-Participation Restrictions  Community Activity;Cleaning;Driving;Laundry    Stability/Clinical Decision Making  Evolving/Moderate complexity    Clinical Decision Making  Moderate    Rehab Potential  Good    PT Frequency  2x / week    PT Duration  6 weeks    PT Treatment/Interventions  ADLs/Self Care Home Management;Cryotherapy;Ultrasound;Dry needling;Spinal Manipulations;Passive range of motion;Manual techniques;Joint Manipulations;Patient/family education;Gait training;Therapeutic exercise;Moist Heat;Traction;Iontophoresis 4mg /ml Dexamethasone;Electrical Stimulation;Functional mobility training;Neuromuscular re-education;Therapeutic activities    PT Next Visit Plan  TDN, postural correction    PT Home Exercise Plan  Childs pose, doorway rhomboid stretch    Consulted and Agree with Plan of Care  Patient       Patient will benefit from skilled therapeutic intervention in order to improve the following deficits and impairments:  Pain, Postural dysfunction, Impaired UE functional use, Impaired flexibility,  Increased fascial restricitons, Decreased strength, Decreased activity tolerance, Decreased range of motion, Improper body mechanics, Decreased endurance, Decreased mobility  Visit Diagnosis: Pain in thoracic spine     Problem List Patient Active Problem List   Diagnosis Date Noted  . Encounter for screening colonoscopy   . Rectal lesion   . Osteoporosis 10/06/2019  . Family history of ovarian cancer 07/26/2017  . Vitamin D deficiency 05/07/2016  . Sickle-cell trait ([place]) 05/07/2016  . 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 [place], [address] Phone: [phone]   Fax:  [phone]  Name: Angela Gordon MRN: [id number] Date of Birth: [birth year]

## 2019-12-06 ENCOUNTER — Other Ambulatory Visit: Payer: Self-pay | Admitting: Family Medicine

## 2019-12-06 DIAGNOSIS — M546 Pain in thoracic spine: Secondary | ICD-10-CM

## 2019-12-06 DIAGNOSIS — M545 Low back pain, unspecified: Secondary | ICD-10-CM

## 2019-12-07 ENCOUNTER — Ambulatory Visit: Payer: BC Managed Care – PPO | Admitting: Physical Therapy

## 2019-12-07 NOTE — Telephone Encounter (Signed)
Pt called in to follow up on refill request. Pt says that she is having PT and need her medication to help. Pt would like to now if provider would send in Rx?   Please advise.

## 2019-12-08 ENCOUNTER — Other Ambulatory Visit: Payer: Self-pay

## 2019-12-08 ENCOUNTER — Ambulatory Visit: Payer: BC Managed Care – PPO | Admitting: Physical Therapy

## 2019-12-08 ENCOUNTER — Encounter: Payer: Self-pay | Admitting: Physical Therapy

## 2019-12-08 DIAGNOSIS — M546 Pain in thoracic spine: Secondary | ICD-10-CM

## 2019-12-08 NOTE — Addendum Note (Signed)
Addended by: Docia Furl on: 12/08/2019 10:09 AM   Modules accepted: Orders

## 2019-12-08 NOTE — Therapy (Signed)
[place] [facility] [address], [place], [address] Phone: [phone]   Fax:  [phone]  Physical Therapy Treatment  Patient Details  Name: Angela Gordon MRN: [id number] Date of Birth: 1970/12/31 No data recorded  Encounter Date: 12/08/2019    Past Medical History:  Diagnosis Date  . Asthma    Uses rescue inhaler occasionally.  Marland Kitchen History of blood transfusion 1992   Blood Loss from Toxemia of Pregnancy  . Osteoporosis    Strong FHx of Osteoporosis.  Marland Kitchen Scoliosis    Seen Dr. Jefm Bryant  . Sickle-cell trait ([place])   . Stroke [facility]) 1992   Toxemia of Pregnancy    Past Surgical History:  Procedure Laterality Date  . ABDOMINAL HYSTERECTOMY  2010   total -Menopausal symptoms, takes HRT  . BREAST EXCISIONAL BIOPSY Left 2003   neg bx dr Jamal Collin  . BREAST SURGERY Left 2004   biopsy, benign  . COLONOSCOPY WITH PROPOFOL N/A 11/29/2019   Procedure: COLONOSCOPY WITH PROPOFOL;  Surgeon: Virgel Manifold, MD;  Location: [facility] ENDOSCOPY;  Service: Endoscopy;  Laterality: N/A;  . CYST REMOVAL HAND Right 2005-06  . HERNIA REPAIR  2004  . TONSILLECTOMY  1978    There were no vitals filed for this visit.    Manual STM with trigger point release to L mid tspine paraspinals, mid trap/rhomboids, and levator  Following: Dry Needling: (1/2) 24mm .30 needles placed along the L tspine paraspinal with shelving technique about T8, rhomboid minor/mid trap fibers with pincer grasp,  to decrease increased muscular spasms and trigger points with the patient positioned in supine. Patient was educated on risks and benefits of therapy and verbally consents to PT.    Ther-Ex AAROM scap protraction/retraction with nustep L2 62mins with cuing for SPM over 60 Banded 90/90 ER with overhead press GTB 3x 10 bilat with heavy cuing for scapulo-humeral rhythm with good carry over following demo and cuing Push up on elevated mat table 3x  5/6/8 with demo and heavy cuing to set up for scapular retraction without tspine ext, TC and VC for core engagement with good carry over Diagonal band pull aparts GTB 3x 8 with cuing for set up scapular retraction with good carry over                              PT Short Term Goals - 11/28/19 1113      PT SHORT TERM GOAL #1   Title  Pt will be independent with HEP in order to improve strength and decrease back pain in order to improve pain-free function at home and work.    Baseline  11/28/19 HEP given    Time  4    Period  Weeks    Status  New        PT Long Term Goals - 11/28/19 1113      PT LONG TERM GOAL #1   Title  Patient will increase FOTO score to 63 to demonstrate predicted increase in functional mobility to complete ADLs    Baseline  11/28/19 43    Time  6    Period  Weeks    Status  New      PT LONG TERM GOAL #2   Title  Pt will increase periscapular strength of by at least 1/2 MMT grade in order to demonstrate improvement in strength and function.    Baseline  11/28/19 4-  bilat Y/T/I 4+ bilat latissumus    Time  6    Period  Weeks    Status  New      PT LONG TERM GOAL #3   Title  Pt will decrease worst back pain as reported on NPRS by at least 2 points in order to demonstrate clinically significant reduction in back pain.    Baseline  11/28/19 8/10    Time  6    Period  Weeks    Status  New      PT LONG TERM GOAL #4   Title  Patient will report being able to sit for 1 hour zoom meeting without pain/adjusting to be able to maintain focus to complete work duties    Baseline  11/28/19 able to sit for 29mins before pain increases and is distracting    Time  6    Period  Weeks    Status  New              Patient will benefit from skilled therapeutic intervention in order to improve the following deficits and impairments:     Visit Diagnosis: No diagnosis found.     Problem List Patient Active Problem List   Diagnosis  Date Noted  . Encounter for screening colonoscopy   . Rectal lesion   . Osteoporosis 10/06/2019  . Family history of ovarian cancer 07/26/2017  . Vitamin D deficiency 05/07/2016  . Sickle-cell trait ([place]) 05/07/2016  . Osteoporosis, 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   Shelton Silvas PT, DPT Shelton Silvas 12/08/2019, 8:16 AM  [place] [facility] [address] [place], [address] Phone: [phone]   Fax:  [phone]  Name: LADISLAVA PREGLER MRN: [id number] Date of Birth: [birth year]

## 2019-12-11 MED ORDER — NAPROXEN 500 MG PO TABS: 500.0000 mg | ORAL_TABLET | Freq: Two times a day (BID) | ORAL | 1 refills | Status: DC | PRN

## 2019-12-11 NOTE — Addendum Note (Signed)
Addended by: Delsa Grana on: 12/11/2019 06:11 PM   Modules accepted: Orders

## 2019-12-12 ENCOUNTER — Ambulatory Visit: Payer: BC Managed Care – PPO | Admitting: Physical Therapy

## 2019-12-12 ENCOUNTER — Other Ambulatory Visit: Payer: Self-pay

## 2019-12-12 ENCOUNTER — Telehealth: Payer: Self-pay

## 2019-12-12 ENCOUNTER — Encounter: Payer: Self-pay | Admitting: Physical Therapy

## 2019-12-12 DIAGNOSIS — M546 Pain in thoracic spine: Secondary | ICD-10-CM

## 2019-12-12 NOTE — Therapy (Addendum)
[place] [facility] [address], [place], [address] Phone: [phone]   Fax:  [phone]  Physical Therapy Treatment  Patient Details  Name: Angela Gordon MRN: [id number] Date of Birth: [birth year] No data recorded  Encounter Date: 12/12/2019  PT End of Session - 12/12/19 0953    Visit Number  4    Number of Visits  17    Date for PT Re-Evaluation  01/09/20    PT Start Time  0915    PT Stop Time  0945    PT Time Calculation (min)  30 min    Activity Tolerance  Patient tolerated treatment well    Behavior During Therapy  [facility] for tasks assessed/performed       Past Medical History:  Diagnosis Date  . Asthma    Uses rescue inhaler occasionally.  Marland Kitchen History of blood transfusion 1992   Blood Loss from Toxemia of Pregnancy  . Osteoporosis    Strong FHx of Osteoporosis.  Marland Kitchen Scoliosis    Seen Dr. Jefm Bryant  . Sickle-cell trait ([place])   . Stroke [facility]) 1992   Toxemia of Pregnancy    Past Surgical History:  Procedure Laterality Date  . ABDOMINAL HYSTERECTOMY  2010   total -Menopausal symptoms, takes HRT  . BREAST EXCISIONAL BIOPSY Left 2003   neg bx dr Jamal Collin  . BREAST SURGERY Left 2004   biopsy, benign  . COLONOSCOPY WITH PROPOFOL N/A 11/29/2019   Procedure: COLONOSCOPY WITH PROPOFOL;  Surgeon: Virgel Manifold, MD;  Location: [facility] ENDOSCOPY;  Service: Endoscopy;  Laterality: N/A;  . CYST REMOVAL HAND Right 2005-06  . HERNIA REPAIR  2004  . TONSILLECTOMY  1978    There were no vitals filed for this visit.  Subjective Assessment - 12/12/19 0918    Subjective  Pt reports 0/10 pain today. Pt had some discomfort with sitting, but relief with change in position and chair type. Pt reports minimal soreness from last session.    Pertinent History  Patinet is a 49 year old female presenting with mid and LBP. Patient reports pain started after moving some things around the house into the attic in August . She  heard and felt a pop in her back and has been having some increased pain ever since. Reports pain feels most intense between L scapula and spine and will travel down to low back. Reports pain is sharp without numbess/tingling. Made worse by prolonged standing, sitting (sits at desk chair for work from home), carrying bag on L side, pulling with her LUE for household chores. In sitting, her pain is better when she sits a pillow behind her and she is constantly shifting. Worst pain over last week 8/10; best 2/10. 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 Patient was an avid exerciser in the gym before COVID, but now walks or bikes 3x/week 38mins with daughter. Pt denies N/V, B&B changes, unexplained weight fluctuation, saddle paresthesia, fever, night sweats, or unrelenting night pain at this time. Does report that if she turns to her L side while sleeping it will awaken her but she can go back to sleep. History of osteoporosis and scolosis.    Limitations  Sitting;House hold activities;Standing;Walking    How long can you sit comfortably?  71mins    How long can you stand comfortably?  45-34mins    How long can you walk comfortably?  45-30mins  Diagnostic tests  10/06/19 Tspine Xray  Minimal levocurvature.  No acute osseous abnormality 10/06/19 Lumbar Xray Negative; bone density scan shows improvement with current treatment    Patient Stated Goals  Decrease pain to complete daily activities.    Currently in Pain?  No/denies    Pain Onset  --         THEREX AAROM on Nustep level 2 5 mins for protraction/retraction ER at 90 to [employer] press 2x8 with cueing for motor control with good carryover TRX pull-up 3x10 with cueing for abdominal activation and rhomboid/middle trap activation Push up on elevated mat table 3x10 with cueing for abdominal activation and posterior pelvic tilt Rhomboid door frame stretch 30 second  bilat                       PT Education - 12/12/19 0920    Education Details  therex form, posture, motor control    Person(s) Educated  Patient    Methods  Demonstration;Tactile cues;Verbal cues    Comprehension  Verbalized understanding;Returned demonstration       PT Short Term Goals - 11/28/19 1113      PT SHORT TERM GOAL #1   Title  Pt will be independent with HEP in order to improve strength and decrease back pain in order to improve pain-free function at home and work.    Baseline  11/28/19 HEP given    Time  4    Period  Weeks    Status  New        PT Long Term Goals - 11/28/19 1113      PT LONG TERM GOAL #1   Title  Patient will increase FOTO score to 63 to demonstrate predicted increase in functional mobility to complete ADLs    Baseline  11/28/19 43    Time  6    Period  Weeks    Status  New      PT LONG TERM GOAL #2   Title  Pt will increase periscapular strength of by at least 1/2 MMT grade in order to demonstrate improvement in strength and function.    Baseline  11/28/19 4- bilat Y/T/I 4+ bilat latissumus    Time  6    Period  Weeks    Status  New      PT LONG TERM GOAL #3   Title  Pt will decrease worst back pain as reported on NPRS by at least 2 points in order to demonstrate clinically significant reduction in back pain.    Baseline  11/28/19 8/10    Time  6    Period  Weeks    Status  New      PT LONG TERM GOAL #4   Title  Patient will report being able to sit for 1 hour zoom meeting without pain/adjusting to be able to maintain focus to complete work duties    Baseline  11/28/19 able to sit for 75mins before pain increases and is distracting    Time  6    Period  Weeks    Status  New          Plan - 12/12/19 0954    Clinical Impression Statement  Session shortened to patient being late d/t misreading schedule. Pt shows good carryover with motor control from last session and decreased pain. PT increased strength therex  to improve scapular rhythm and control coupled with stretching to improve mobility. Pt continues to make good progress and  PT plans to progress as appropriate.    Personal Factors and Comorbidities  Age;Sex;Comorbidity 1;Comorbidity 2;Time since onset of injury/illness/exacerbation    Comorbidities  Osteoporosis, Thoracic levoscolosis    Examination-Activity Limitations  Sit;Sleep;Lift;Carry;Reach Overhead;Stand    Examination-Participation Restrictions  Community Activity;Cleaning;Driving;Laundry    Stability/Clinical Decision Making  Evolving/Moderate complexity    Clinical Decision Making  Moderate    Rehab Potential  Good    PT Frequency  2x / week    PT Duration  6 weeks    PT Treatment/Interventions  ADLs/Self Care Home Management;Cryotherapy;Ultrasound;Dry needling;Spinal Manipulations;Passive range of motion;Manual techniques;Joint Manipulations;Patient/family education;Gait training;Therapeutic exercise;Moist Heat;Traction;Iontophoresis 4mg /ml Dexamethasone;Electrical Stimulation;Functional mobility training;Neuromuscular re-education;Therapeutic activities    PT Next Visit Plan  postural correction, TA activation/pelvic tilt    PT Home Exercise Plan  Childs pose, doorway rhomboid stretch    Consulted and Agree with Plan of Care  Patient       Patient will benefit from skilled therapeutic intervention in order to improve the following deficits and impairments:  Pain, Postural dysfunction, Impaired UE functional use, Impaired flexibility, Increased fascial restricitons, Decreased strength, Decreased activity tolerance, Decreased range of motion, Improper body mechanics, Decreased endurance, Decreased mobility  Visit Diagnosis: Pain in thoracic spine      Problem List Patient Active Problem List   Diagnosis Date Noted  . Encounter for screening colonoscopy   . Rectal lesion   . Osteoporosis 10/06/2019  . Family history of ovarian cancer 07/26/2017  . Vitamin D deficiency  05/07/2016  . Sickle-cell trait ([place]) 05/07/2016  . Osteoporosis, 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   Shelton Silvas PT, DPT Shelton Silvas 12/12/2019, 10:08 AM  [place] [facility] [address], [place], [address] Phone: [phone]   Fax:  [phone]  Name: NAKAIYAH FRENSLEY MRN: [id number] Date of Birth: [birth year]

## 2019-12-12 NOTE — Telephone Encounter (Signed)
-----   Message from Irving Copas., MD sent at 12/11/2019  7:10 PM EST ----- Regarding: FW: Endoscopic polypectomy? Uchenna Seufert,Please see the message below.Happy to proceed with scheduling a clinic visit and EMR colonoscopy 90-minute.Thanks.GM ----- Message ----- From: Irving Copas., MD Sent: 12/11/2019   4:43 PM EST To: Ileana Roup, MD, Delman Cheadle Haggett Subject: RE: Endoscopic polypectomy?                    Gerald Stabs, I would be happy to see and evaluate and potentially remove the lesion if it looks endoscopically amenable to resection. If this is true, diagnosis of Cap Polyposis, it would be quite rare. Deepak Bless, this is a patient that is being referred for EMR attempt of Rectal Polyp.  Please schedule for clinic and we can get set up for an EMR in February (can go ahead and schedule now or wait til her clinic visit. OK to overbook her in clinic if necessary to discuss, though should have some slots in early February. Thanks. GM ----- Message ----- From: Ileana Roup, MD Sent: 12/11/2019   3:29 PM EST To: Delman Cheadle Haggett, # Subject: Endoscopic polypectomy?                        Hey Dr. Rush Landmark -   I saw this lady in the office today. She has hx of BRBPR and underwent diagnostic scope 12/30. Polyp like lesion found in rectum that's somewhat flat but biopsies showed inflammatory cap polyp. Do you think you'd be willing to give this a swing for endoscopic polypectomy? I'm not sure how high this is; I can't feel anything on rectal exam. On retroflexion, there was no polyp so I know it's at least not that low. Let me know what you think.  CW

## 2019-12-12 NOTE — Telephone Encounter (Signed)
Spoke with the pt and have her scheduled for 2/10 with Dr Rush Landmark.  Would prefer to schedule colon at office visit to discuss prior.

## 2019-12-14 ENCOUNTER — Ambulatory Visit: Payer: BC Managed Care – PPO | Admitting: Physical Therapy

## 2019-12-14 ENCOUNTER — Other Ambulatory Visit: Payer: Self-pay

## 2019-12-14 ENCOUNTER — Encounter: Payer: Self-pay | Admitting: Physical Therapy

## 2019-12-14 DIAGNOSIS — M546 Pain in thoracic spine: Secondary | ICD-10-CM

## 2019-12-14 NOTE — Therapy (Addendum)
[place] [facility] [address], [place], [address] Phone: [phone]   Fax:  [phone]  Physical Therapy Treatment  Patient Details  Name: Angela Gordon MRN: [id number] Date of Birth: 05-Oct-1971 No data recorded  Encounter Date: 12/14/2019  PT End of Session - 12/14/19 0946    Visit Number  5    Number of Visits  17    Date for PT Re-Evaluation  01/09/20    PT Start Time  0900    PT Stop Time  0940    PT Time Calculation (min)  40 min    Activity Tolerance  Patient tolerated treatment well    Behavior During Therapy  [facility] for tasks assessed/performed       Past Medical History:  Diagnosis Date  . Asthma    Uses rescue inhaler occasionally.  Marland Kitchen History of blood transfusion 1992   Blood Loss from Toxemia of Pregnancy  . Osteoporosis    Strong FHx of Osteoporosis.  Marland Kitchen Scoliosis    Seen Dr. Jefm Bryant  . Sickle-cell trait ([place])   . Stroke [facility]) 1992   Toxemia of Pregnancy    Past Surgical History:  Procedure Laterality Date  . ABDOMINAL HYSTERECTOMY  2010   total -Menopausal symptoms, takes HRT  . BREAST EXCISIONAL BIOPSY Left 2003   neg bx dr Jamal Collin  . BREAST SURGERY Left 2004   biopsy, benign  . COLONOSCOPY WITH PROPOFOL N/A 11/29/2019   Procedure: COLONOSCOPY WITH PROPOFOL;  Surgeon: Virgel Manifold, MD;  Location: [facility] ENDOSCOPY;  Service: Endoscopy;  Laterality: N/A;  . CYST REMOVAL HAND Right 2005-06  . HERNIA REPAIR  2004  . TONSILLECTOMY  1978    There were no vitals filed for this visit.  Subjective Assessment - 12/14/19 0904    Subjective  Pt reports a little "stiffness" from last session with minimal soreness and currently 1/10 pain. Pt states good compliance with HEP.    Pertinent History  Patinet is a 49 year old female presenting with mid and LBP. Patient reports pain started after moving some things around the house into the attic in August . She heard and felt a pop in her  back and has been having some increased pain ever since. Reports pain feels most intense between L scapula and spine and will travel down to low back. Reports pain is sharp without numbess/tingling. Made worse by prolonged standing, sitting (sits at desk chair for work from home), carrying bag on L side, pulling with her LUE for household chores. In sitting, her pain is better when she sits a pillow behind her and she is constantly shifting. Worst pain over last week 8/10; best 2/10. 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 Patient was an avid exerciser in the gym before COVID, but now walks or bikes 3x/week 51mins with daughter. Pt denies N/V, B&B changes, unexplained weight fluctuation, saddle paresthesia, fever, night sweats, or unrelenting night pain at this time. Does report that if she turns to her L side while sleeping it will awaken her but she can go back to sleep. History of osteoporosis and scolosis.    Limitations  Sitting;House hold activities;Standing;Walking    How long can you sit comfortably?  85mins    How long can you stand comfortably?  45-75mins    How long can you walk comfortably?  45-65mins    Diagnostic tests  10/06/19 Tspine Xray  Minimal levocurvature.  No acute osseous abnormality 10/06/19 Lumbar Xray Negative; bone density scan shows improvement with current treatment    Patient Stated Goals  Decrease pain to complete daily activities.        THEREX AAROM on Nustep L2 for 4 mins for protraction/retraction Book openings in supine bilat x4 with cueing to tuck knees to chest I's (OH shoulder flexion)/T's/Y's in standing 2# bilat 2x5  Plank on mat table 3x12 sec with heavy cueing for scapular retraction and TA activation/posterior pelvic tilt; good carry-over until pt starts to fatigue Lat pull downs 15# x8, 20# x10, 25# x10 for cueing for scap retraction with good carry-over Ball toss behind the back with med ball (1kg)  for 3x30 secs  Standing TRX stretch for lats and rhomboids x30 sec                         PT Education - 12/14/19 0945    Education Details  therex form, motor control    Person(s) Educated  Patient    Methods  Explanation;Tactile cues;Verbal cues;Demonstration    Comprehension  Returned demonstration       PT Short Term Goals - 11/28/19 1113      PT SHORT TERM GOAL #1   Title  Pt will be independent with HEP in order to improve strength and decrease back pain in order to improve pain-free function at home and work.    Baseline  11/28/19 HEP given    Time  4    Period  Weeks    Status  New        PT Long Term Goals - 11/28/19 1113      PT LONG TERM GOAL #1   Title  Patient will increase FOTO score to 63 to demonstrate predicted increase in functional mobility to complete ADLs    Baseline  11/28/19 43    Time  6    Period  Weeks    Status  New      PT LONG TERM GOAL #2   Title  Pt will increase periscapular strength of by at least 1/2 MMT grade in order to demonstrate improvement in strength and function.    Baseline  11/28/19 4- bilat Y/T/I 4+ bilat latissumus    Time  6    Period  Weeks    Status  New      PT LONG TERM GOAL #3   Title  Pt will decrease worst back pain as reported on NPRS by at least 2 points in order to demonstrate clinically significant reduction in back pain.    Baseline  11/28/19 8/10    Time  6    Period  Weeks    Status  New      PT LONG TERM GOAL #4   Title  Patient will report being able to sit for 1 hour zoom meeting without pain/adjusting to be able to maintain focus to complete work duties    Baseline  11/28/19 able to sit for 82mins before pain increases and is distracting    Time  6    Period  Weeks    Status  New            Plan - 12/14/19 0947    Clinical Impression Statement  PT progressed therex and dynamic strength exercises with good response from pt. PT incorporated dynamic stretching to address  pt concern with "stiffness" from last session. Pt notes she is happy  with therapy progress. Pt is making excellent progress and is on track to discharge in the next few visits. PT will continue progressing treatment ast indicated.    Personal Factors and Comorbidities  Age;Sex;Comorbidity 1;Comorbidity 2;Time since onset of injury/illness/exacerbation    Comorbidities  Osteoporosis, Thoracic levoscolosis    Examination-Activity Limitations  Sit;Sleep;Lift;Carry;Reach Overhead;Stand    Examination-Participation Restrictions  Community Activity;Cleaning;Driving;Laundry    Stability/Clinical Decision Making  Evolving/Moderate complexity    Clinical Decision Making  Moderate    Rehab Potential  Good    PT Frequency  2x / week    PT Duration  6 weeks    PT Treatment/Interventions  ADLs/Self Care Home Management;Cryotherapy;Ultrasound;Dry needling;Spinal Manipulations;Passive range of motion;Manual techniques;Joint Manipulations;Patient/family education;Gait training;Therapeutic exercise;Moist Heat;Traction;Iontophoresis 4mg /ml Dexamethasone;Electrical Stimulation;Functional mobility training;Neuromuscular re-education;Therapeutic activities    PT Next Visit Plan  TA activation    PT Home Exercise Plan  Childs pose, doorway rhomboid stretch    Consulted and Agree with Plan of Care  Patient       Patient will benefit from skilled therapeutic intervention in order to improve the following deficits and impairments:  Pain, Postural dysfunction, Impaired UE functional use, Impaired flexibility, Increased fascial restricitons, Decreased strength, Decreased activity tolerance, Decreased range of motion, Improper body mechanics, Decreased endurance, Decreased mobility  Visit Diagnosis: Pain in thoracic spine     Problem List Patient Active Problem List   Diagnosis Date Noted  . Encounter for screening colonoscopy   . Rectal lesion   . Osteoporosis 10/06/2019  . Family history of ovarian cancer  07/26/2017  . Vitamin D deficiency 05/07/2016  . Sickle-cell trait ([place]) 05/07/2016  . Osteoporosis, 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   Shelton Silvas PT, DPT Shelton Silvas 12/14/2019, 10:08 AM  [place] [facility] [address] [place], [address] Phone: [phone]   Fax:  [phone]  Name: Angela Gordon MRN: [id number] Date of Birth: 14-Dec-1970

## 2019-12-19 ENCOUNTER — Other Ambulatory Visit: Payer: Self-pay

## 2019-12-19 ENCOUNTER — Encounter: Payer: Self-pay | Admitting: Physical Therapy

## 2019-12-19 ENCOUNTER — Ambulatory Visit: Payer: BC Managed Care – PPO | Admitting: Physical Therapy

## 2019-12-19 DIAGNOSIS — M546 Pain in thoracic spine: Secondary | ICD-10-CM | POA: Diagnosis not present

## 2019-12-19 NOTE — Therapy (Addendum)
[place] [facility] [address] [place], [address] Phone: [phone]   Fax:  [phone]  Physical Therapy Treatment  Patient Details  Name: Angela Gordon MRN: [id number] Date of Birth: 20-Dec-1970 No data recorded  Encounter Date: 12/19/2019  PT End of Session - 12/19/19 0944    Visit Number  6    Number of Visits  17    Date for PT Re-Evaluation  01/09/20    PT Start Time  0900    PT Stop Time  0940    PT Time Calculation (min)  40 min    Activity Tolerance  Patient tolerated treatment well    Behavior During Therapy  [facility] for tasks assessed/performed       Past Medical History:  Diagnosis Date  . Asthma    Uses rescue inhaler occasionally.  Marland Kitchen History of blood transfusion 1992   Blood Loss from Toxemia of Pregnancy  . Osteoporosis    Strong FHx of Osteoporosis.  Marland Kitchen Scoliosis    Seen Dr. Jefm Bryant  . Sickle-cell trait ([place])   . Stroke [facility]) 1992   Toxemia of Pregnancy    Past Surgical History:  Procedure Laterality Date  . ABDOMINAL HYSTERECTOMY  2010   total -Menopausal symptoms, takes HRT  . BREAST EXCISIONAL BIOPSY Left 2003   neg bx dr Jamal Collin  . BREAST SURGERY Left 2004   biopsy, benign  . COLONOSCOPY WITH PROPOFOL N/A 11/29/2019   Procedure: COLONOSCOPY WITH PROPOFOL;  Surgeon: Virgel Manifold, MD;  Location: [facility] ENDOSCOPY;  Service: Endoscopy;  Laterality: N/A;  . CYST REMOVAL HAND Right 2005-06  . HERNIA REPAIR  2004  . TONSILLECTOMY  1978    There were no vitals filed for this visit.  Subjective Assessment - 12/19/19 0902    Subjective  Pt reports some soreness from long drive over the weekend, but no pain currently. Pt reports stretching after road trip helped alleviate pain.    Pertinent History  Patinet is a 49 year old female presenting with mid and LBP. Patient reports pain started after moving some things around the house into the attic in August . She heard and felt a  pop in her back and has been having some increased pain ever since. Reports pain feels most intense between L scapula and spine and will travel down to low back. Reports pain is sharp without numbess/tingling. Made worse by prolonged standing, sitting (sits at desk chair for work from home), carrying bag on L side, pulling with her LUE for household chores. In sitting, her pain is better when she sits a pillow behind her and she is constantly shifting. Worst pain over last week 8/10; best 2/10. 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 Patient was an avid exerciser in the gym before COVID, but now walks or bikes 3x/week 46mins with daughter. Pt denies N/V, B&B changes, unexplained weight fluctuation, saddle paresthesia, fever, night sweats, or unrelenting night pain at this time. Does report that if she turns to her L side while sleeping it will awaken her but she can go back to sleep. History of osteoporosis and scolosis.    Limitations  Sitting;House hold activities;Standing;Walking    How long can you stand comfortably?  45-85mins    How long can you walk comfortably?  45-50mins    Diagnostic tests  10/06/19 Tspine Xray  Minimal levocurvature.  No acute osseous abnormality 10/06/19  Lumbar Xray Negative; bone density scan shows improvement with current treatment    Patient Stated Goals  Decrease pain to complete daily activities.        THEREX AAROM on Nustep L2 for 4 mins for protraction/retraction T spine extension on foam roller with PVC OH holding for 3 sec x5 Scapular punches with 2# DB in supine 1x12; too easy for pt, 3# 2x12; with cueing for scapular protraction and good carryover Prone 'Y's 3x8 with over-activation of upper traps, some ability to correct, but still lacking motor control on second set. Third set good carryover Plank walk outs on  23" mat x5 with tactile cues for keeping shoulders pulled down and back  Standing rows 15# x10,  20# x10 Lat pull downs 25# x8 Standing TRX stretch 3x30 sec for rhomboids and middle traps Bent over shoulder flexion stretch on treadmill side rail 3x30 sec                       PT Education - 12/19/19 0944    Education Details  therex form, TA activation    Person(s) Educated  Patient    Methods  Demonstration;Verbal cues;Tactile cues    Comprehension  Verbalized understanding;Tactile cues required       PT Short Term Goals - 11/28/19 1113      PT SHORT TERM GOAL #1   Title  Pt will be independent with HEP in order to improve strength and decrease back pain in order to improve pain-free function at home and work.    Baseline  11/28/19 HEP given    Time  4    Period  Weeks    Status  New        PT Long Term Goals - 11/28/19 1113      PT LONG TERM GOAL #1   Title  Patient will increase FOTO score to 63 to demonstrate predicted increase in functional mobility to complete ADLs    Baseline  11/28/19 43    Time  6    Period  Weeks    Status  New      PT LONG TERM GOAL #2   Title  Pt will increase periscapular strength of by at least 1/2 MMT grade in order to demonstrate improvement in strength and function.    Baseline  11/28/19 4- bilat Y/T/I 4+ bilat latissumus    Time  6    Period  Weeks    Status  New      PT LONG TERM GOAL #3   Title  Pt will decrease worst back pain as reported on NPRS by at least 2 points in order to demonstrate clinically significant reduction in back pain.    Baseline  11/28/19 8/10    Time  6    Period  Weeks    Status  New      PT LONG TERM GOAL #4   Title  Patient will report being able to sit for 1 hour zoom meeting without pain/adjusting to be able to maintain focus to complete work duties    Baseline  11/28/19 able to sit for 93mins before pain increases and is distracting    Time  6    Period  Weeks    Status  New            Plan - 12/19/19 0941    Clinical Impression Statement  Pt continues to make  excellent progress with rehabilitation and good carryover with motor  control. PT progressed strength therex and added new dynamic stetching for mobility in thoracic spine. Pt is on track with discharge plan in the next couple visists. PT will add progressions to challenge core and parascapular strength as needed.    Personal Factors and Comorbidities  Age;Sex;Comorbidity 1;Comorbidity 2;Time since onset of injury/illness/exacerbation    Comorbidities  Osteoporosis, Thoracic levoscolosis    Examination-Activity Limitations  Sit;Sleep;Lift;Carry;Reach Overhead;Stand    Examination-Participation Restrictions  Community Activity;Cleaning;Driving;Laundry    Stability/Clinical Decision Making  Evolving/Moderate complexity    Clinical Decision Making  Moderate    Rehab Potential  Good    PT Frequency  2x / week    PT Duration  6 weeks    PT Treatment/Interventions  ADLs/Self Care Home Management;Cryotherapy;Ultrasound;Dry needling;Spinal Manipulations;Passive range of motion;Manual techniques;Joint Manipulations;Patient/family education;Gait training;Therapeutic exercise;Moist Heat;Traction;Iontophoresis 4mg /ml Dexamethasone;Electrical Stimulation;Functional mobility training;Neuromuscular re-education;Therapeutic activities    PT Next Visit Plan  Core and dynamic shoulder stability    PT Home Exercise Plan  Childs pose, doorway rhomboid stretch    Consulted and Agree with Plan of Care  Patient       Patient will benefit from skilled therapeutic intervention in order to improve the following deficits and impairments:  Pain, Postural dysfunction, Impaired UE functional use, Impaired flexibility, Increased fascial restricitons, Decreased strength, Decreased activity tolerance, Decreased range of motion, Improper body mechanics, Decreased endurance, Decreased mobility  Visit Diagnosis: Pain in thoracic spine     Problem List Patient Active Problem List   Diagnosis Date Noted  . Encounter for  screening colonoscopy   . Rectal lesion   . Osteoporosis 10/06/2019  . Family history of ovarian cancer 07/26/2017  . Vitamin D deficiency 05/07/2016  . Sickle-cell trait ([place]) 05/07/2016  . 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, [place], [address] Phone: [phone]   Fax:  [phone]  Name: Angela Gordon MRN: [id number] Date of Birth: 12/03/1970

## 2019-12-21 ENCOUNTER — Ambulatory Visit: Payer: BC Managed Care – PPO | Admitting: Physical Therapy

## 2019-12-25 ENCOUNTER — Other Ambulatory Visit: Payer: Self-pay | Admitting: Family Medicine

## 2019-12-26 ENCOUNTER — Other Ambulatory Visit: Payer: Self-pay

## 2019-12-26 ENCOUNTER — Encounter: Payer: Self-pay | Admitting: Physical Therapy

## 2019-12-26 ENCOUNTER — Ambulatory Visit: Payer: BC Managed Care – PPO | Admitting: Physical Therapy

## 2019-12-26 DIAGNOSIS — M546 Pain in thoracic spine: Secondary | ICD-10-CM

## 2019-12-26 NOTE — Therapy (Addendum)
[place] [facility] [address] [place], [address] Phone: [phone]   Fax:  [phone]  Physical Therapy Treatment/Discharge Summary 11/27/2020-12/26/2019  Patient Details  Name: Angela Gordon MRN: [id number] Date of Birth: 05/16/1971 No data recorded  Encounter Date: 12/26/2019  PT End of Session - 12/26/19 1111    Visit Number  7    Number of Visits  17    Date for PT Re-Evaluation  01/09/20    PT Start Time  0900    PT Stop Time  0940    PT Time Calculation (min)  40 min    Activity Tolerance  Patient tolerated treatment well    Behavior During Therapy  [facility] for tasks assessed/performed       Past Medical History:  Diagnosis Date  . Asthma    Uses rescue inhaler occasionally.  Marland Kitchen History of blood transfusion 1992   Blood Loss from Toxemia of Pregnancy  . Osteoporosis    Strong FHx of Osteoporosis.  Marland Kitchen Scoliosis    Seen Dr. Jefm Bryant  . Sickle-cell trait ([place])   . Stroke [facility]) 1992   Toxemia of Pregnancy    Past Surgical History:  Procedure Laterality Date  . ABDOMINAL HYSTERECTOMY  2010   total -Menopausal symptoms, takes HRT  . BREAST EXCISIONAL BIOPSY Left 2003   neg bx dr Jamal Collin  . BREAST SURGERY Left 2004   biopsy, benign  . COLONOSCOPY WITH PROPOFOL N/A 11/29/2019   Procedure: COLONOSCOPY WITH PROPOFOL;  Surgeon: Virgel Manifold, MD;  Location: [facility] ENDOSCOPY;  Service: Endoscopy;  Laterality: N/A;  . CYST REMOVAL HAND Right 2005-06  . HERNIA REPAIR  2004  . TONSILLECTOMY  1978    There were no vitals filed for this visit.  Subjective Assessment - 12/26/19 0903    Subjective  Pt took a muscle relaxer over the weekend from moving in son to college "to make it easier to relax". Today pt does not report any pain today. Good compliance with HEP and stretches with good success for improving pain. Pt has been feeling better and thinks she is ready for a discharge plan.    Pertinent  History  Patinet is a 49 year old female presenting with mid and LBP. Patient reports pain started after moving some things around the house into the attic in August . She heard and felt a pop in her back and has been having some increased pain ever since. Reports pain feels most intense between L scapula and spine and will travel down to low back. Reports pain is sharp without numbess/tingling. Made worse by prolonged standing, sitting (sits at desk chair for work from home), carrying bag on L side, pulling with her LUE for household chores. In sitting, her pain is better when she sits a pillow behind her and she is constantly shifting. Worst pain over last week 8/10; best 2/10. 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 Patient was an avid exerciser in the gym before COVID, but now walks or bikes 3x/week 56mns with daughter. Pt denies N/V, B&B changes, unexplained weight fluctuation, saddle paresthesia, fever, night sweats, or unrelenting night pain at this time. Does report that if she turns to her L side while sleeping it will awaken her but she can go back to sleep. History of osteoporosis and scolosis.    Limitations  Sitting;House hold activities;Standing;Walking    How long can you  sit comfortably?  66mns    How long can you stand comfortably?  45-632ms    How long can you walk comfortably?  45-6054m    Diagnostic tests  10/06/19 Tspine Xray  Minimal levocurvature.  No acute osseous abnormality 10/06/19 Lumbar Xray Negative; bone density scan shows improvement with current treatment    Patient Stated Goals  Decrease pain to complete daily activities.         THEREX/HEP introduction -AAROM on Nustep L2 for 4 mins for protraction/retraction -Standing retraction to ER to OH [facility] Incess at 90 ABD x8 -Lateral step with ER stability band x6 -Scapular retractions with green band x10-15 -Push up on knees with cueing for tucking x10 -Prone Ys on the  table x10 -Diagonal band pull aparts x10-12  PT educated pt on HEP rep range and when to increase or decrease. PT educated pt on proper techniques for all exercises                        PT Education - 12/26/19 1111    Education Details  discharge planning, HEP for maintenance    Person(s) Educated  Patient    Methods  Explanation;Demonstration;Handout    Comprehension  Returned demonstration;Verbalized understanding       PT Short Term Goals - 12/26/19 0915      PT SHORT TERM GOAL #1   Title  Pt will be independent with HEP in order to improve strength and decrease back pain in order to improve pain-free function at home and work.    Baseline  11/28/19 HEP given; is consistent with HEP    Time  4    Period  Weeks    Status  Achieved        PT Long Term Goals - 12/26/19 [id number]   PT LONG TERM GOAL #1   Title  Patient will increase FOTO score to 63 to demonstrate predicted increase in functional mobility to complete ADLs    Baseline  12/26/19 83    Time  6    Period  Weeks    Status  Achieved      PT LONG TERM GOAL #2   Title  Pt will increase periscapular strength of by at least 1/2 MMT grade in order to demonstrate improvement in strength and function.    Baseline  11/28/19 4- bilat Y/T/I 4+ bilat latissumus; 12/26/2019 4/5 Y/T bilat, Y 4-/5; 5/5 latissimus (tested in sitting)    Time  6    Period  Weeks    Status  Partially Met      PT LONG TERM GOAL #3   Title  Pt will decrease worst back pain as reported on NPRS by at least 2 points in order to demonstrate clinically significant reduction in back pain.    Baseline  11/28/19 8/10; 12/26/2019 5/10    Time  6    Period  Weeks    Status  Achieved      PT LONG TERM GOAL #4   Title  Patient will report being able to sit for 1 hour zoom meeting without pain/adjusting to be able to maintain focus to complete work duties    Baseline  11/28/19 able to sit for 80m48mbefore pain increases and is  distracting; 12/26/2019 able to sit 8 hours wihtout pain    Time  6    Period  Weeks    Status  Achieved  Plan - 12/26/19 1112    Clinical Impression Statement  PT went over discharge plan with pt and provided handout for pt HEP. PT provided contact information should pt have any other concerns in the future. Pt verbalized understanding and successfully performed demonstration of all exercies for discharge.    Personal Factors and Comorbidities  Age;Sex;Comorbidity 1;Comorbidity 2;Time since onset of injury/illness/exacerbation    Comorbidities  Osteoporosis, Thoracic levoscolosis    Examination-Activity Limitations  Sit;Sleep;Lift;Carry;Reach Overhead;Stand    Examination-Participation Restrictions  Community Activity;Cleaning;Driving;Laundry    Stability/Clinical Decision Making  Evolving/Moderate complexity    Clinical Decision Making  Moderate    Rehab Potential  Good    PT Frequency  2x / week    PT Duration  6 weeks    PT Treatment/Interventions  ADLs/Self Care Home Management;Cryotherapy;Ultrasound;Dry needling;Spinal Manipulations;Passive range of motion;Manual techniques;Joint Manipulations;Patient/family education;Gait training;Therapeutic exercise;Moist Heat;Traction;Iontophoresis 40m/ml Dexamethasone;Electrical Stimulation;Functional mobility training;Neuromuscular re-education;Therapeutic activities    PT Home Exercise Plan  Discharged to [id number]    Consulted and Agree with Plan of Care  Patient       Patient will benefit from skilled therapeutic intervention in order to improve the following deficits and impairments:  Pain, Postural dysfunction, Impaired UE functional use, Impaired flexibility, Increased fascial restricitons, Decreased strength, Decreased activity tolerance, Decreased range of motion, Improper body mechanics, Decreased endurance, Decreased mobility  Visit Diagnosis: Pain in thoracic spine     Problem List Patient Active Problem List    Diagnosis Date Noted  . Encounter for screening colonoscopy   . Rectal lesion   . Osteoporosis 10/06/2019  . Family history of ovarian cancer 07/26/2017  . Vitamin D deficiency 05/07/2016  . Sickle-cell trait ([place] 05/07/2016  . Osteoporosis, 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   CShelton SilvasPT, DPT JIvin Booty SPT CShelton Silvas1/26/2021, 1:26 PM  [place] [facility] [address] [place] [address]Phone: [phone]  Fax:  [phone] Name: Angela FINERTYMRN: [id number] of Birth: 404-23-1972

## 2019-12-28 ENCOUNTER — Ambulatory Visit: Payer: BC Managed Care – PPO | Admitting: Physical Therapy

## 2020-01-02 ENCOUNTER — Encounter: Payer: BC Managed Care – PPO | Admitting: Physical Therapy

## 2020-01-04 ENCOUNTER — Encounter: Payer: BC Managed Care – PPO | Admitting: Physical Therapy

## 2020-01-09 ENCOUNTER — Encounter: Payer: BC Managed Care – PPO | Admitting: Physical Therapy

## 2020-01-10 ENCOUNTER — Ambulatory Visit: Payer: BC Managed Care – PPO | Admitting: Gastroenterology

## 2020-01-10 ENCOUNTER — Other Ambulatory Visit (INDEPENDENT_AMBULATORY_CARE_PROVIDER_SITE_OTHER): Payer: BC Managed Care – PPO

## 2020-01-10 ENCOUNTER — Encounter: Payer: Self-pay | Admitting: Gastroenterology

## 2020-01-10 VITALS — BP 112/80 | HR 74 | Temp 97.8°F | Ht 62.0 in | Wt 110.5 lb

## 2020-01-10 DIAGNOSIS — K621 Rectal polyp: Secondary | ICD-10-CM

## 2020-01-10 DIAGNOSIS — K6289 Other specified diseases of anus and rectum: Secondary | ICD-10-CM

## 2020-01-10 DIAGNOSIS — K625 Hemorrhage of anus and rectum: Secondary | ICD-10-CM

## 2020-01-10 LAB — CBC
HCT: 43.8 % (ref 36.0–46.0)
Hemoglobin: 14.9 g/dL (ref 12.0–15.0)
MCHC: 33.9 g/dL (ref 30.0–36.0)
MCV: 93.8 fl (ref 78.0–100.0)
Platelets: 251 10*3/uL (ref 150.0–400.0)
RBC: 4.67 Mil/uL (ref 3.87–5.11)
RDW: 12.7 % (ref 11.5–15.5)
WBC: 6.8 10*3/uL (ref 4.0–10.5)

## 2020-01-10 LAB — BASIC METABOLIC PANEL
BUN: 11 mg/dL (ref 6–23)
CO2: 29 mEq/L (ref 19–32)
Calcium: 9.4 mg/dL (ref 8.4–10.5)
Chloride: 104 mEq/L (ref 96–112)
Creatinine, Ser: 1 mg/dL (ref 0.40–1.20)
GFR: 71.66 mL/min (ref >60.00)
Glucose, Bld: 87 mg/dL (ref 70–99)
Potassium: 4.1 mEq/L (ref 3.5–5.1)
Sodium: 138 mEq/L (ref 135–145)

## 2020-01-10 LAB — PROTIME-INR
INR: 1 ratio (ref 0.8–1.0)
Prothrombin Time: 11.6 s (ref 9.6–13.1)

## 2020-01-10 MED ORDER — SUPREP BOWEL PREP KIT 17.5-3.13-1.6 GM/177ML PO SOLN: 1.0000 | ORAL | 0 refills | Status: DC

## 2020-01-10 NOTE — Patient Instructions (Addendum)
You have been scheduled for a CT scan of the abdomen and pelvis at [facility] 1st floor Radiology  You are scheduled on 01/17/20 at 10:00am. You should arrive 15 minutes prior to your appointment time for registration. Please follow the written instructions below on the day of your exam:  WARNING: IF YOU ARE ALLERGIC TO IODINE/X-RAY DYE, PLEASE NOTIFY RADIOLOGY IMMEDIATELY AT [phone]! YOU WILL BE GIVEN A 13 HOUR PREMEDICATION PREP.  1) Do not eat or drink anything after 6:00am (4 hours prior to your test) 2) You have been given 2 bottles of oral contrast to drink. The solution may taste better if refrigerated, but do NOT add ice or any other liquid to this solution. Shake well before drinking.    Drink 1 bottle of contrast @ 8:00am (2 hours prior to your exam)  Drink 1 bottle of contrast @ 9:00am (1 hour prior to your exam)  You may take any medications as prescribed with a small amount of water, if necessary. If you take any of the following medications: METFORMIN, GLUCOPHAGE, GLUCOVANCE, AVANDAMET, RIOMET, FORTAMET, [place] MET, JANUMET, GLUMETZA or METAGLIP, you MAY be asked to HOLD this medication 48 hours AFTER the exam.  The purpose of you drinking the oral contrast is to aid in the visualization of your intestinal tract. The contrast solution may cause some diarrhea. Depending on your individual set of symptoms, you may also receive an intravenous injection of x-ray contrast/dye. Plan on being at [facility]   for 30 minutes or longer, depending on the type of exam you are having performed.  This test typically takes 30-45 minutes to complete.  If you have any questions regarding your exam or if you need to reschedule, you may call the CT department at [phone] between the hours of 8:00 am and 5:00 pm, Monday-Friday.  ________________________________________________________________________   Dennis Bast have been scheduled for a colonoscopy. Please follow written instructions given to  you at your visit today.  Please pick up your prep supplies at the pharmacy within the next 1-3 days. If you use inhalers (even only as needed), please bring them with you on the day of your procedure.  Your provider has requested that you go to the basement level for lab work before leaving today. Press "B" on the elevator. The lab is located at the first door on the left as you exit the elevator.   We have sent the following medications to your pharmacy for you to pick up at your convenience: Suprep   Thank you for choosing me and [facility].  Dr. Rush Landmark

## 2020-01-10 NOTE — Progress Notes (Signed)
[place] VISIT   Primary Care Provider Delsa Grana, [name] [address] [place] [address] [phone]  Referring Provider Dr. Bonna Gains & Dr. Dema Severin  Patient Profile: Angela Gordon is a 49 y.o. female with a pmh significant for scoliosis and osteoporosis, sickle cell trait, asthma, Inflammatory CAP Polyp of the rectum.  The patient presents to the [facility] for an evaluation and management of problem(s) noted below:  Problem List 1. Rectal polyp   2. Rectal mass   3. BRBPR (bright red blood per rectum)     History of Present Illness This is a patient who presented for a screening colonoscopy in December 2020 with Dr. Bonna Gains.  At time of procedure a rectal lesion/mass was noted and biopsied.  The biopsy returned as an inflammatory Polyp.  The patient was referred to colorectal surgery for consideration of resection and seen by Dr. Dema Severin.  Subsequently I was contacted to evaluate the patient as to whether an endoscopic approach could be performed for resection.  The patient presents to clinic today for this evaluation with her husband.  She tells me she has had abdominal pain on and off for years.  She has not thought much of it and was not sure that this had anything to do with the recently found polyp.  She does describe however approximately 5 to 6 months worth of intermittent bright red blood per rectum.  Her colonoscopy exam did not show overt hemorrhoids based on the report.  Patient has not had any significant weight loss.  There is no family history of colon cancer or GI malignancies that she is aware of.  She does use nonsteroidals.  She has never had an upper endoscopy.  She uses the restroom daily or every other day basis.  At the time of her bowel movements she does not usually notice bright red blood per rectum.  GI Review of Systems Positive as above Negative for dysphagia, odynophagia, postprandial pain,  melena  Review of Systems General: Denies fevers/chills/weight loss HEENT: Denies oral lesions Cardiovascular: Denies chest pain Pulmonary: Denies shortness of breath Gastroenterological: See HPI Genitourinary: Denies darkened urine Hematological: History previously for easy bruising/bleeding Endocrine: Denies temperature intolerance Dermatological: Denies jaundice Psychological: Mood is anxious   Medications Current Outpatient Medications  Medication Sig Dispense Refill  . albuterol (PROVENTIL) (5 MG/ML) 0.5% nebulizer solution Take 2.5 mg by nebulization as needed for wheezing or shortness of breath.    Marland Kitchen alendronate-cholecalciferol (FOSAMAX PLUS D) 70-2800 MG-UNIT tablet Take 1 tablet by mouth every 7 (seven) days. Take with a full glass of water on an empty stomach.    . naproxen (NAPROSYN) 500 MG tablet Take 1 tablet (500 mg total) by mouth 2 (two) times daily as needed for moderate pain. 60 tablet 1  . progesterone (PROMETRIUM) 100 MG capsule Take 100 mg by mouth at bedtime.    Marland Kitchen tiZANidine (ZANAFLEX) 4 MG tablet Take 1 tablet (4 mg total) by mouth every 8 (eight) hours as needed for muscle spasms. 90 tablet 1  . SUPREP BOWEL PREP KIT 17.5-3.13-1.6 GM/177ML SOLN Take 1 kit by mouth as directed. For colonoscopy prep 354 mL 0   No current facility-administered medications for this visit.    Allergies Allergies  Allergen Reactions  . Erythromycin Base Other (See Comments)  . Penicillins Rash and Diarrhea  . Scallops [Shellfish Allergy] Anaphylaxis  . Erythromycin     Histories Past Medical History:  Diagnosis Date  . Asthma  Uses rescue inhaler occasionally.  Marland Kitchen History of blood transfusion 1992   Blood Loss from Toxemia of Pregnancy  . Osteoporosis    Strong FHx of Osteoporosis.  Marland Kitchen Scoliosis    Seen Dr. Jefm Bryant  . Sickle-cell trait ([place])   . Stroke [facility]) 1992   Toxemia of Pregnancy   Past Surgical History:  Procedure Laterality Date  . ABDOMINAL  HYSTERECTOMY  2010   total -Menopausal symptoms, takes HRT  . BREAST EXCISIONAL BIOPSY Left 2003   neg bx dr Jamal Collin  . BREAST SURGERY Left 2004   biopsy, benign  . COLONOSCOPY WITH PROPOFOL N/A 11/29/2019   Procedure: COLONOSCOPY WITH PROPOFOL;  Surgeon: Virgel Manifold, MD;  Location: [facility] ENDOSCOPY;  Service: Endoscopy;  Laterality: N/A;  . CYST REMOVAL HAND Right 2005-06  . HERNIA REPAIR  2004  . TONSILLECTOMY  1978   Social History   Socioeconomic History  . Marital status: Married    Spouse name: Not on file  . Number of children: Not on file  . Years of education: Not on file  . Highest education level: Not on file  Occupational History  . Not on file  Tobacco Use  . Smoking status: Never Smoker  . Smokeless tobacco: Never Used  Substance and Sexual Activity  . Alcohol use: Yes    Alcohol/week: 1.0 standard drinks    Types: 1 Glasses of wine per week    Comment: 1 glass of wine every other day  . Drug use: No  . Sexual activity: Yes    Birth control/protection: Surgical  Other Topics Concern  . Not on file  Social History Narrative  . Not on file   Social Determinants of Health   Financial Resource Strain:   . Difficulty of Paying Living Expenses: Not on file  Food Insecurity:   . Worried About Charity fundraiser in the Last Year: Not on file  . Ran Out of Food in the Last Year: Not on file  Transportation Needs:   . Lack of Transportation (Medical): Not on file  . Lack of Transportation (Non-Medical): Not on file  Physical Activity:   . Days of Exercise per Week: Not on file  . Minutes of Exercise per Session: Not on file  Stress:   . Feeling of Stress : Not on file  Social Connections:   . Frequency of Communication with Friends and Family: Not on file  . Frequency of Social Gatherings with Friends and Family: Not on file  . Attends Religious Services: Not on file  . Active Member of Clubs or Organizations: Not on file  . Attends Theatre manager Meetings: Not on file  . Marital Status: Not on file  Intimate Partner Violence:   . Fear of Current or Ex-Partner: Not on file  . Emotionally Abused: Not on file  . Physically Abused: Not on file  . Sexually Abused: Not on file   Family History  Problem Relation Age of Onset  . Hypertension Mother   . Osteoporosis Mother   . Cervical cancer Mother        Hysterectomy in 69s.  . Stroke Mother   . Crohn's disease Mother   . Irritable bowel syndrome Mother   . Sickle cell trait Father   . Sickle cell trait Sister   . Heart disease Maternal Aunt   . Breast cancer Maternal Aunt   . Heart disease Maternal Uncle   . Hypertension Maternal Uncle   . Ovarian cancer Paternal  Aunt 45  . Heart disease Maternal Grandmother   . Hypertension Maternal Grandmother   . Osteoporosis Maternal Grandmother   . Rheum arthritis Maternal Grandfather   . Hypertension Maternal Grandfather   . Prostate cancer Maternal Grandfather   . Leukemia Cousin   . Colon cancer Neg Hx   . Esophageal cancer Neg Hx   . Pancreatic cancer Neg Hx   . Stomach cancer Neg Hx   . Inflammatory bowel disease Neg Hx   . Liver disease Neg Hx   . Rectal cancer Neg Hx    I have reviewed her medical, social, and family history in detail and updated the electronic medical record as necessary.    PHYSICAL EXAMINATION  BP 112/80 (BP Location: Left Arm, Patient Position: Sitting, Cuff Size: Normal)   Pulse 74   Temp 97.8 F (36.6 C)   Ht 5' 2" (1.575 m)   Wt 110 lb 8 oz (50.1 kg)   SpO2 99%   BMI 20.21 kg/m  Wt Readings from Last 3 Encounters:  01/10/20 110 lb 8 oz (50.1 kg)  11/29/19 109 lb (49.4 kg)  11/03/19 109 lb 12.8 oz (49.8 kg)  GEN: NAD, appears stated age, doesn't appear chronically ill, accompanied by husband PSYCH: Cooperative, without pressured speech EYE: Conjunctivae pink, sclerae anicteric ENT: MMM CV: Nontachycardic RESP: No wheezing present GI: NABS, soft, NT/ND, without rebound or  guarding MSK/EXT: No significant lower extremity edema noted SKIN: No jaundice NEURO:  Alert & Oriented x 3, no focal deficits   REVIEW OF DATA  I reviewed the following data at the time of this encounter:  GI Procedures and Studies  10/2019 Colonoscopy - Tumor in the rectum. Biopsied. - The sigmoid colon, descending colon, transverse colon, ascending colon and cecum are normal. - The distal rectum and anal verge are normal on retroflexion view. DIAGNOSIS:  A. RECTUM LESION; COLD BIOPSY:  - BENIGN INFLAMMATORY CAP POLYP.  - NEGATIVE FOR MALIGNANCY.   Laboratory Studies  Reviewed in EPIC  Imaging Studies  No relevant studies to review   ASSESSMENT  Ms. Demeter is a 49 y.o. female with a pmh significant for scoliosis and osteoporosis, sickle cell trait, asthma, Inflammatory CAP Polyp of the rectum.  The patient is seen today for evaluation and management of:  1. Rectal polyp   2. Rectal mass   3. BRBPR (bright red blood per rectum)    The patient is hemodynamically and clinically stable at this time.  Patient's clinical presentation of a rectal mass which was biopsied showing evidence of inflammatory CAP polyp of the rectum does go along with her clinical history.  She has had some bright red blood per rectum as well as some rectal/anal discomfort.  The abdominal pain is not clearly related to this particular lesion although there are reports of patients having abdominal pain with these types of lesions.  The literature is sparse in regards to these types of polyps.  However, we know that CAP polyps are benign in nature based on case series that are available in the literature and are not malignant.  However, they can recur even when they are removed surgically or endoscopically.  I do think it would be helpful for me in the setting of how this was reported as a rectal mass to obtain cross-sectional imaging and so I would like to obtain a CT abdomen/pelvis just to ensure that we are not  seeing something that looks to be more invasive that may require me to consider  an endoscopic ultrasound prior to resection attempt or if there is something more concerning that would require just colorectal surgery pursuing the resection.  We will move forward with scheduling that.  However, based on our colorectal surgeons wanting to try a less invasive approach, and based on the description and endoscopic pictures I do feel that it is reasonable to plan to pursue an Advanced Polypectomy attempt of the polyp/lesion while awaiting the CT scan results.  We discussed some of the techniques of advanced polypectomy which include Endoscopic Mucosal Resection, OVESCO Full-Thickness Resection, Endorotor Morcellation, and Tissue Ablation via Fulguration.  The risks and benefits of endoscopic evaluation were discussed with the patient; these include but are not limited to the risk of perforation, infection, bleeding, missed lesions, lack of diagnosis, severe illness requiring hospitalization, as well as anesthesia and sedation related illnesses.  During attempts at advanced resection, the risks of bleeding and perforation/leak are increased as opposed to diagnostic and screening procedures, and that was discussed with the patient as well.   In addition, I explained that with the possible need for piecemeal resection, subsequent short-interval endoscopic evaluation for follow up and potential retreatment of the lesion/area may be necessary.  I did offer, a referral to surgery in order for patient to have opportunity to discuss surgical management/intervention prior to finalizing decision for attempt at endoscopic removal, however, the patient deferred on this.  If, after attempt at removal of the polyp/lesion, it is found that the patient has a complication or that an invasive lesion or malignant lesion is found, or that the polyp/lesion continues to recur, the patient is aware and understands that surgery may still be  indicated/required.  All patient questions were answered, to the best of my ability, and the patient agrees to the aforementioned plan of action with follow-up as indicated.   PLAN  Laboratories as outlined below CT abdomen/pelvis with and without contrast to ensure nothing looks to significant that would preclude me from resection attempt Flexible sigmoidoscopy with EMR 2-hour slot   Orders Placed This Encounter  Procedures  . Procedural/ Surgical Case Request: COLONOSCOPY WITH PROPOFOL, ENDOSCOPIC MUCOSAL RESECTION  . CT Abdomen Pelvis W Contrast  . CBC  . Basic Metabolic Panel (BMET)  . INR/PT  . Ambulatory referral to Gastroenterology    New Prescriptions   SUPREP BOWEL PREP KIT 17.5-3.13-1.6 GM/177ML SOLN    Take 1 kit by mouth as directed. For colonoscopy prep   Modified Medications   No medications on file    Planned Follow Up No follow-ups on file.   Total Time in Face-to-Face and in Coordination of Care for patient including independent/personal interpretation/review of prior testing, medical history, examination, medication adjustment, communicating results with the patient directly, and documentation with the EHR is 45 minutes.   Justice Britain, MD [facility] Advanced [facility]oscopy Office # [phone]

## 2020-01-11 ENCOUNTER — Encounter: Payer: Self-pay | Admitting: Gastroenterology

## 2020-01-11 ENCOUNTER — Encounter: Payer: BC Managed Care – PPO | Admitting: Physical Therapy

## 2020-01-11 DIAGNOSIS — K625 Hemorrhage of anus and rectum: Secondary | ICD-10-CM | POA: Insufficient documentation

## 2020-01-11 DIAGNOSIS — K621 Rectal polyp: Secondary | ICD-10-CM | POA: Insufficient documentation

## 2020-01-11 HISTORY — DX: Rectal polyp: K62.1

## 2020-01-16 ENCOUNTER — Encounter: Payer: BC Managed Care – PPO | Admitting: Physical Therapy

## 2020-01-17 ENCOUNTER — Ambulatory Visit (HOSPITAL_COMMUNITY): Payer: BC Managed Care – PPO

## 2020-01-18 ENCOUNTER — Encounter: Payer: BC Managed Care – PPO | Admitting: Physical Therapy

## 2020-01-24 ENCOUNTER — Telehealth: Payer: Self-pay | Admitting: Gastroenterology

## 2020-01-24 NOTE — Telephone Encounter (Signed)
The pt has been advised to have [facility] to fax the results to our office for review.

## 2020-01-25 ENCOUNTER — Telehealth: Payer: Self-pay

## 2020-01-25 NOTE — Telephone Encounter (Signed)
Results obtained from [facility] and will be sent to Dr. Rush Landmark for review.

## 2020-01-25 NOTE — Telephone Encounter (Signed)
Dr.Mansouraty, Pt called this afternoon. I informed her that we did rec her CT scan results yesterday afternoon via fax. Pt is very anxious because she has seen results come across on her mychart. I told her that we understand and will contact her as soon as you have reviewed CT. Pt has been informed that you are at the hospital doing ERCP's this afternoon. I have faxed a copy of results to Dr Dema Severin at [facility] and placed results in your inbox in your office. I told pt that there is a high likely chance that you may call her yourself with results, otherwise she would be contacted by myself or Patty.

## 2020-01-26 NOTE — Telephone Encounter (Signed)
Per Dr Rush Landmark- he spoke with pt, yesterday evening regarding CT scan results.

## 2020-01-26 NOTE — Telephone Encounter (Signed)
I called and spoke with the patient and on 01/24/2019 1 in the evening.  We went over the results of her CT scan which can be found in care everywhere.  They do not necessarily change my mind in regards to considering a evaluation for potential advanced resection of this inflammatory rectal Polyp.  However, with the concern for circumferential thickening I do wonder about what we may end up being able to do.  I have forwarded the results of the faxed report to Dr. Orest Dikes office so that he can review this as well. Spoke with the patient told her that if Dr. Dema Severin feels that the CT scan findings are concerning enough that he would just want to move forward with surgery that I am okay with that.  She has the CD and I told her to take it to her upcoming clinic visit with Dr. Dema Severin next week so that he can review it in person. I will relay this message to Dr. Dema Severin as well. I am not concerned for the question/query of colitis as her GI symptoms are chronic in nature and she has not noted a significant change in her chronic bowel habits.  I will not treat her for colitis at this time. She was appreciative for the call back.  CT abdomen/pelvis results IMPRESSION: 1. Circumferential wall thickening involving the rectum with associated mild hypervascularity. Differential considerations include rectal malignancy (given history of rectal mass) or inflammation/infection. 2. Short segment wall thickening involving the distal aspect of the sigmoid colon. This may reflect a second site of infection or inflammation versus malignancy.  3. Prominent, though nonenlarged perirectal lymph nodes. This may be reactive or metastatic. 4. Additional findings as detailed above.   FYI Dr. Margarito Liner, MD [facility] Advanced [facility]oscopy Office # [id number]

## 2020-02-12 ENCOUNTER — Telehealth: Payer: Self-pay | Admitting: Gastroenterology

## 2020-02-12 NOTE — Telephone Encounter (Signed)
Pls call pt, she has some questions about her upcoming procedure.

## 2020-02-12 NOTE — Telephone Encounter (Signed)
The pt states that she wants to be referred to a different surgeon.  She is not happy with Dr Dema Severin.  She tells me that she will speak with Dr Rush Landmark at her colon on 3/29.  We discussed the appt information.  The pt has been advised of the information and verbalized understanding.

## 2020-02-19 ENCOUNTER — Other Ambulatory Visit: Payer: Self-pay

## 2020-02-19 ENCOUNTER — Other Ambulatory Visit (HOSPITAL_COMMUNITY): Payer: BC Managed Care – PPO

## 2020-02-19 ENCOUNTER — Ambulatory Visit: Payer: BC Managed Care – PPO | Attending: Internal Medicine

## 2020-02-19 DIAGNOSIS — Z23 Encounter for immunization: Secondary | ICD-10-CM

## 2020-02-19 NOTE — Progress Notes (Signed)
   Covid-19 Vaccination Clinic  Name:  Angela Gordon    MRN: [id number] DOB: 04/14/1971  02/19/2020  [name] was observed post Covid-19 immunization for 15 minutes without incident. She was provided with Vaccine Information Sheet and instruction to access the V-Safe system.   [name] was instructed to call 911 with any severe reactions post vaccine: Marland Kitchen Difficulty breathing  . Swelling of face and throat  . A fast heartbeat  . A bad rash all over body  . Dizziness and weakness   Immunizations Administered    Name Date Dose VIS Date Route   Pfizer COVID-19 Vaccine 02/19/2020 12:06 PM 0.3 mL 11/10/2019 Intramuscular   Manufacturer: [place]   Lot: [id number]   [place]: [id number]

## 2020-02-20 ENCOUNTER — Telehealth: Payer: Self-pay | Admitting: Gastroenterology

## 2020-02-20 NOTE — Telephone Encounter (Signed)
Sample of suprep has been placed at front desk for pt to pick up. Left message for pt asking for return call.  Clenpiq or [id number] can not be mixed with Gatorade. Only option would be Suprep if pt wishes to mix with Gatorade.

## 2020-02-22 ENCOUNTER — Other Ambulatory Visit: Payer: Self-pay | Admitting: Gastroenterology

## 2020-02-22 ENCOUNTER — Inpatient Hospital Stay (HOSPITAL_COMMUNITY): Admission: RE | Admit: 2020-02-22 | Payer: BC Managed Care – PPO | Source: Ambulatory Visit

## 2020-02-22 LAB — SARS CORONAVIRUS 2 (TAT 6-24 HRS): SARS Coronavirus 2: NEGATIVE

## 2020-02-23 ENCOUNTER — Encounter (HOSPITAL_COMMUNITY): Payer: Self-pay | Admitting: Gastroenterology

## 2020-02-23 ENCOUNTER — Other Ambulatory Visit: Payer: Self-pay

## 2020-02-23 NOTE — Progress Notes (Signed)
Spoke with pt for pre-op call. Pt denies cardiac history, HTN or diabetes. Pt does have hx of asthma.   Instructed pt to hold Naprosyn prior to procedure.    Covid test done 02/22/20 and it's negative. Pt states she is in quarantine and understands she needs to stay in quarantine until she comes to the hospital on Monday.  Pt states she has picked up the bowel prep and knows when to start it.  No EKG, CXR or any studies noted

## 2020-02-25 NOTE — Anesthesia Preprocedure Evaluation (Addendum)
Anesthesia Evaluation  Patient identified by MRN, date of birth, ID band Patient awake    Reviewed: Allergy & Precautions, NPO status , Patient's Chart, lab work & pertinent test results  History of Anesthesia Complications (+) PONV and history of anesthetic complications  Airway Mallampati: II  TM Distance: >3 FB Neck ROM: Full    Dental  (+) Dental Advisory Given   Pulmonary asthma ,    Pulmonary exam normal breath sounds clear to auscultation       Cardiovascular negative cardio ROS Normal cardiovascular exam Rhythm:Regular Rate:Normal     Neuro/Psych  Headaches, CVA    GI/Hepatic Neg liver ROS,   Endo/Other  negative endocrine ROS  Renal/GU      Musculoskeletal   Abdominal   Peds  Hematology  (+) Sickle cell trait ,   Anesthesia Other Findings   Reproductive/Obstetrics                                                            Anesthesia Evaluation  Patient identified by MRN, date of birth, ID band Patient awake    Reviewed: Allergy & Precautions, NPO status , Patient's Chart, lab work & pertinent test results  History of Anesthesia Complications Negative for: history of anesthetic complications  Airway Mallampati: II  TM Distance: >3 FB Neck ROM: Full    Dental no notable dental hx.    Pulmonary asthma ,    breath sounds clear to auscultation- rhonchi (-) wheezing      Cardiovascular Exercise Tolerance: Good (-) hypertension(-) CAD, (-) Past MI, (-) Cardiac Stents and (-) CABG  Rhythm:Regular Rate:Normal - Systolic murmurs and - Diastolic murmurs    Neuro/Psych neg Seizures negative psych ROS   GI/Hepatic negative GI ROS, Neg liver ROS,   Endo/Other  negative endocrine ROSneg diabetes  Renal/GU negative Renal ROS     Musculoskeletal negative musculoskeletal ROS (+)   Abdominal (+) - obese,   Peds  Hematology negative hematology ROS (+)    Anesthesia Other Findings Past Medical History: No date: Asthma     Comment:  Uses rescue inhaler occasionally. 1992: History of blood transfusion     Comment:  Blood Loss from Toxemia of Pregnancy No date: Osteoporosis     Comment:  Strong FHx of Osteoporosis. No date: Scoliosis     Comment:  Seen Dr. Jefm Bryant No date: Sickle-cell trait ([place]) 1992: Stroke ([place])     Comment:  Toxemia of Pregnancy   Reproductive/Obstetrics                             Anesthesia Physical Anesthesia Plan  ASA: II  Anesthesia Plan: General   Post-op Pain Management:    Induction: Intravenous  PONV Risk Score and Plan: 2 and Propofol infusion  Airway Management Planned: Natural Airway  Additional Equipment:   Intra-op Plan:   Post-operative Plan:   Informed Consent: I have reviewed the patients History and Physical, chart, labs and discussed the procedure including the risks, benefits and alternatives for the proposed anesthesia with the patient or authorized representative who has indicated his/her understanding and acceptance.     Dental advisory given  Plan Discussed with: CRNA and Anesthesiologist  Anesthesia Plan Comments:  Anesthesia Quick Evaluation  Anesthesia Physical Anesthesia Plan  ASA: III  Anesthesia Plan: MAC   Post-op Pain Management:    Induction: Intravenous  PONV Risk Score and Plan: Treatment may vary due to age or medical condition  Airway Management Planned: Nasal Cannula and Natural Airway  Additional Equipment: None  Intra-op Plan:   Post-operative Plan:   Informed Consent: I have reviewed the patients History and Physical, chart, labs and discussed the procedure including the risks, benefits and alternatives for the proposed anesthesia with the patient or authorized representative who has indicated his/her understanding and acceptance.     Dental advisory given  Plan Discussed with: CRNA  Anesthesia  Plan Comments:        Anesthesia Quick Evaluation

## 2020-02-26 ENCOUNTER — Ambulatory Visit (HOSPITAL_COMMUNITY)
Admission: RE | Admit: 2020-02-26 | Discharge: 2020-02-26 | Disposition: A | Discharge status: Home / Self Care | Payer: BC Managed Care – PPO | Source: Ambulatory Visit | Attending: Gastroenterology | Admitting: Gastroenterology

## 2020-02-26 ENCOUNTER — Ambulatory Visit (HOSPITAL_COMMUNITY): Payer: BC Managed Care – PPO | Admitting: Anesthesiology

## 2020-02-26 ENCOUNTER — Encounter (HOSPITAL_COMMUNITY)
Admission: RE | Disposition: A | Discharge status: Home / Self Care | Payer: Self-pay | Source: Ambulatory Visit | Attending: Gastroenterology

## 2020-02-26 ENCOUNTER — Other Ambulatory Visit: Payer: Self-pay

## 2020-02-26 ENCOUNTER — Encounter (HOSPITAL_COMMUNITY): Payer: Self-pay | Admitting: Gastroenterology

## 2020-02-26 DIAGNOSIS — Z8249 Family history of ischemic heart disease and other diseases of the circulatory system: Secondary | ICD-10-CM | POA: Insufficient documentation

## 2020-02-26 DIAGNOSIS — E669 Obesity, unspecified: Secondary | ICD-10-CM | POA: Insufficient documentation

## 2020-02-26 DIAGNOSIS — D490 Neoplasm of unspecified behavior of digestive system: Secondary | ICD-10-CM

## 2020-02-26 DIAGNOSIS — D573 Sickle-cell trait: Secondary | ICD-10-CM | POA: Diagnosis not present

## 2020-02-26 DIAGNOSIS — J45909 Unspecified asthma, uncomplicated: Secondary | ICD-10-CM | POA: Insufficient documentation

## 2020-02-26 DIAGNOSIS — K644 Residual hemorrhoidal skin tags: Secondary | ICD-10-CM | POA: Insufficient documentation

## 2020-02-26 DIAGNOSIS — M419 Scoliosis, unspecified: Secondary | ICD-10-CM | POA: Diagnosis not present

## 2020-02-26 DIAGNOSIS — K6289 Other specified diseases of anus and rectum: Secondary | ICD-10-CM | POA: Diagnosis not present

## 2020-02-26 DIAGNOSIS — M810 Age-related osteoporosis without current pathological fracture: Secondary | ICD-10-CM | POA: Insufficient documentation

## 2020-02-26 DIAGNOSIS — R933 Abnormal findings on diagnostic imaging of other parts of digestive tract: Secondary | ICD-10-CM | POA: Diagnosis not present

## 2020-02-26 DIAGNOSIS — Z681 Body mass index (BMI) 19 or less, adult: Secondary | ICD-10-CM | POA: Diagnosis not present

## 2020-02-26 DIAGNOSIS — K641 Second degree hemorrhoids: Secondary | ICD-10-CM | POA: Insufficient documentation

## 2020-02-26 DIAGNOSIS — K621 Rectal polyp: Secondary | ICD-10-CM

## 2020-02-26 DIAGNOSIS — Z8673 Personal history of transient ischemic attack (TIA), and cerebral infarction without residual deficits: Secondary | ICD-10-CM | POA: Insufficient documentation

## 2020-02-26 HISTORY — DX: Cardiac murmur, unspecified: R01.1

## 2020-02-26 HISTORY — DX: Other complications of anesthesia, initial encounter: T88.59XA

## 2020-02-26 HISTORY — PX: BIOPSY: SHX5522

## 2020-02-26 HISTORY — PX: COLONOSCOPY WITH PROPOFOL: SHX5780

## 2020-02-26 HISTORY — PX: EUS: SHX5427

## 2020-02-26 HISTORY — DX: Nausea with vomiting, unspecified: R11.2

## 2020-02-26 HISTORY — DX: Headache, unspecified: R51.9

## 2020-02-26 HISTORY — DX: Hypoglycemia, unspecified: E16.2

## 2020-02-26 HISTORY — DX: Other specified postprocedural states: Z98.890

## 2020-02-26 SURGERY — COLONOSCOPY WITH PROPOFOL
Anesthesia: Monitor Anesthesia Care

## 2020-02-26 MED ORDER — LACTATED RINGERS IV SOLN: INTRAVENOUS | Status: DC | PRN

## 2020-02-26 MED ORDER — SODIUM CHLORIDE 0.9 % IV SOLN: INTRAVENOUS | Status: DC

## 2020-02-26 MED ORDER — PROPOFOL 10 MG/ML IV BOLUS
INTRAVENOUS | Status: DC | PRN
  Administered 2020-02-26 (×3): 10 mg via INTRAVENOUS

## 2020-02-26 MED ORDER — PHENYLEPHRINE 40 MCG/ML (10ML) SYRINGE FOR IV PUSH (FOR BLOOD PRESSURE SUPPORT)
PREFILLED_SYRINGE | INTRAVENOUS | Status: DC | PRN
  Administered 2020-02-26: 80 ug via INTRAVENOUS

## 2020-02-26 MED ORDER — SODIUM CHLORIDE 0.9 % IV SOLN: INTRAVENOUS | Status: DC | PRN

## 2020-02-26 MED ORDER — PROPOFOL 500 MG/50ML IV EMUL
INTRAVENOUS | Status: DC | PRN
  Administered 2020-02-26: 100 ug/kg/min via INTRAVENOUS

## 2020-02-26 SURGICAL SUPPLY — 21 items

## 2020-02-26 NOTE — H&P (Addendum)
GASTROENTEROLOGY PROCEDURE H&P NOTE   Primary Care Physician: Delsa Grana, [name]  HPI: Angela Gordon is a 49 y.o. female who presents for Colonoscopy with attempt at mucosal resection of an inflammatory CAP polyp.  Past Medical History:  Diagnosis Date  . Asthma    Uses rescue inhaler occasionally.  . Complication of anesthesia    slow to wake up  . Headache   . Heart murmur    was told she had MVP when she was around 24 years, she states she never followed up with that and has never had any issues from it  . History of blood transfusion 1992   Blood Loss from Toxemia of Pregnancy  . Hypoglycemia   . Osteoporosis    Strong FHx of Osteoporosis.  Marland Kitchen PONV (postoperative nausea and vomiting)   . Scoliosis    Seen Dr. Jefm Bryant  . Sickle-cell trait ([place])   . Stroke [facility]) 1992   Toxemia of Pregnancy    Past Surgical History:  Procedure Laterality Date  . ABDOMINAL HYSTERECTOMY  2010   total -Menopausal symptoms, takes HRT  . BREAST EXCISIONAL BIOPSY Left 2003   neg bx dr Jamal Collin  . BREAST SURGERY Left 2004   biopsy, benign  . COLONOSCOPY WITH PROPOFOL N/A 11/29/2019   Procedure: COLONOSCOPY WITH PROPOFOL;  Surgeon: Virgel Manifold, MD;  Location: [facility] ENDOSCOPY;  Service: Endoscopy;  Laterality: N/A;  . CYST REMOVAL HAND Right 2005-06  . HERNIA REPAIR  [id number]   umbilical  . TONSILLECTOMY  1978   Current Facility-Administered Medications  Medication Dose Route Frequency Provider Last Rate Last Admin  . 0.9 %  sodium chloride infusion   Intravenous Continuous Mansouraty, Telford Nab., MD       Facility-Administered Medications Ordered in Other Encounters  Medication Dose Route Frequency Provider Last Rate Last Admin  . 0.9 %  sodium chloride infusion   Intravenous Continuous PRN Harden Mo, CRNA   New Bag at 02/26/20 [phone]   Allergies  Allergen Reactions  . Erythromycin Base Other (See Comments)  . Penicillins Diarrhea and Rash    Did it involve swelling of  the face/tongue/throat, SOB, or low BP? Unknown Did it involve sudden or severe rash/hives, skin peeling, or any reaction on the inside of your mouth or nose? Unknown Did you need to seek medical attention at a hospital or doctor's office? Unknown When did it last happen?Childhood If all above answers are "NO", may proceed with cephalosporin use.  Elyse Hsu [Shellfish Allergy] Anaphylaxis  . Erythromycin    Family History  Problem Relation Age of Onset  . Hypertension Mother   . Osteoporosis Mother   . Cervical cancer Mother        Hysterectomy in 78s.  . Stroke Mother   . Crohn's disease Mother   . Irritable bowel syndrome Mother   . Sickle cell trait Father   . Sickle cell trait Sister   . Heart disease Maternal Aunt   . Breast cancer Maternal Aunt   . Heart disease Maternal Uncle   . Hypertension Maternal Uncle   . Ovarian cancer Paternal Aunt 73  . Heart disease Maternal Grandmother   . Hypertension Maternal Grandmother   . Osteoporosis Maternal Grandmother   . Rheum arthritis Maternal Grandfather   . Hypertension Maternal Grandfather   . Prostate cancer Maternal Grandfather   . Leukemia Cousin   . Colon cancer Neg Hx   . Esophageal cancer Neg Hx   . Pancreatic cancer Neg Hx   .  Stomach cancer Neg Hx   . Inflammatory bowel disease Neg Hx   . Liver disease Neg Hx   . Rectal cancer Neg Hx    Social History   Socioeconomic History  . Marital status: Married    Spouse name: Not on file  . Number of children: Not on file  . Years of education: Not on file  . Highest education level: Not on file  Occupational History  . Not on file  Tobacco Use  . Smoking status: Never Smoker  . Smokeless tobacco: Never Used  Substance and Sexual Activity  . Alcohol use: Yes    Alcohol/week: 1.0 standard drinks    Types: 1 Glasses of wine per week    Comment: 1 glass of wine every other day  . Drug use: No  . Sexual activity: Yes    Birth control/protection: Surgical    Other Topics Concern  . Not on file  Social History Narrative  . Not on file   Social Determinants of Health   Financial Resource Strain:   . Difficulty of Paying Living Expenses:   Food Insecurity:   . Worried About Charity fundraiser in the Last Year:   . Arboriculturist in the Last Year:   Transportation Needs:   . Film/video editor (Medical):   Marland Kitchen Lack of Transportation (Non-Medical):   Physical Activity:   . Days of Exercise per Week:   . Minutes of Exercise per Session:   Stress:   . Feeling of Stress :   Social Connections:   . Frequency of Communication with Friends and Family:   . Frequency of Social Gatherings with Friends and Family:   . Attends Religious Services:   . Active Member of Clubs or Organizations:   . Attends Archivist Meetings:   Marland Kitchen Marital Status:   Intimate Partner Violence:   . Fear of Current or Ex-Partner:   . Emotionally Abused:   Marland Kitchen Physically Abused:   . Sexually Abused:     Physical Exam: Vital signs in last 24 hours: Temp:  [97.9 F (36.6 C)] 97.9 F (36.6 C) (03/29 0819) Resp:  [15] 15 (03/29 0819) BP: (134)/(90) 134/90 (03/29 0819) SpO2:  [100 %] 100 % (03/29 0819) Weight:  [49.4 kg] 49.4 kg (03/29 0819)   GEN: NAD EYE: Sclerae anicteric ENT: MMM CV: Non-tachycardic GI: Soft, NT/ND NEURO:  Alert & Oriented x 3  Lab Results: No results for input(s): WBC, HGB, HCT, PLT in the last 72 hours. BMET No results for input(s): NA, K, CL, CO2, GLUCOSE, BUN, CREATININE, CALCIUM in the last 72 hours. LFT No results for input(s): PROT, ALBUMIN, AST, ALT, ALKPHOS, BILITOT, BILIDIR, IBILI in the last 72 hours. PT/INR No results for input(s): LABPROT, INR in the last 72 hours.   Impression / Plan: This is a 49 y.o.female who presents for Colonoscopy with attempt at resection of Inflammatory Cap Polyp of rectum.  The risks and benefits of endoscopic evaluation were discussed with the patient; these include but are not  limited to the risk of perforation, infection, bleeding, missed lesions, lack of diagnosis, severe illness requiring hospitalization, as well as anesthesia and sedation related illnesses.  The patient is agreeable to proceed.    Justice Britain, MD [facility]  Office # [id number]    During the procedure the lesion was evaluated.  I called the patient's husband in order to discuss adding on an EUS to evaluate the lesion more fully.  The risks of EUS including bleeding, infection, aspiration pneumonia and intestinal perforation were discussed as was the possibility it may not give a definitive diagnosis.  The patient's husband agreed to the addition of EUS to better define if I could move forward with potential resection.  Justice Britain, MD [facility] Office # [phone]

## 2020-02-26 NOTE — Telephone Encounter (Signed)
Pt picked up sample of prep.

## 2020-02-26 NOTE — Anesthesia Procedure Notes (Signed)
Procedure Name: MAC Date/Time: 02/26/2020 10:00 AM Performed by: Harden Mo, CRNA Pre-anesthesia Checklist: Patient identified, Emergency Drugs available, Suction available and Patient being monitored Patient Re-evaluated:Patient Re-evaluated prior to induction Oxygen Delivery Method: Simple face mask Preoxygenation: Pre-oxygenation with 100% oxygen Induction Type: IV induction Placement Confirmation: positive ETCO2 and breath sounds checked- equal and bilateral Dental Injury: Teeth and Oropharynx as per pre-operative assessment

## 2020-02-26 NOTE — Transfer of Care (Signed)
Immediate Anesthesia Transfer of Care Note  Patient: Angela Gordon  Procedure(s) Performed: COLONOSCOPY WITH PROPOFOL (N/A ) ENDOSCOPIC MUCOSAL RESECTION (N/A ) LOWER ENDOSCOPIC ULTRASOUND (EUS) BIOPSY  Patient Location: PACU  Anesthesia Type:MAC  Level of Consciousness: awake, drowsy and patient cooperative  Airway & Oxygen Therapy: Patient Spontanous Breathing and Patient connected to face mask oxygen  Post-op Assessment: Report given to RN, Post -op Vital signs reviewed and stable and Patient moving all extremities X 4  Post vital signs: Reviewed and stable  Last Vitals:  Vitals Value Taken Time  BP 112/80 02/26/20 1105  Temp    Pulse 71 02/26/20 1105  Resp 15 02/26/20 1105  SpO2 98 % 02/26/20 1105  Vitals shown include unvalidated device data.  Last Pain:  Vitals:   02/26/20 0819  TempSrc: Temporal  PainSc: 0-No pain         Complications: No apparent anesthesia complications

## 2020-02-26 NOTE — Anesthesia Postprocedure Evaluation (Signed)
Anesthesia Post Note  Patient: Angela Gordon  Procedure(s) Performed: COLONOSCOPY WITH PROPOFOL (N/A ) ENDOSCOPIC MUCOSAL RESECTION (N/A ) LOWER ENDOSCOPIC ULTRASOUND (EUS) BIOPSY     Patient location during evaluation: Endoscopy Anesthesia Type: MAC Level of consciousness: awake and alert Pain management: pain level controlled Vital Signs Assessment: post-procedure vital signs reviewed and stable Respiratory status: spontaneous breathing, nonlabored ventilation, respiratory function stable and patient connected to nasal cannula oxygen Cardiovascular status: blood pressure returned to baseline and stable Postop Assessment: no apparent nausea or vomiting Anesthetic complications: no    Last Vitals:  Vitals:   02/26/20 1120 02/26/20 1130  BP: 118/81   Pulse: 63 64  Resp: 14 18  Temp:  (!) 36.3 C  SpO2: 100% 100%    Last Pain:  Vitals:   02/26/20 1130  TempSrc:   PainSc: 0-No pain                 Barnet Glasgow

## 2020-02-26 NOTE — Op Note (Addendum)
[facility] Patient Name: Angela Gordon Procedure Date : 02/26/2020 MRN: [id number] Attending MD: Justice Britain , MD Date of Birth: 1971-09-29 CSN: [id number] Age: 49 Admit Type: Inpatient Procedure:                Lower EUS Indications:              Rectal deformity found on endoscopy; subepithelial                            tumor versus extrinsic compression, Suspected mass                            in rectum seen on CT scan Providers:                Justice Britain, MD, Jeanella Cara, RN,                            Lazaro Arms, Technician Referring MD:             Lennette Bihari. Bonna Gains MD, MD, Sharon Mt. White                            MD, MD Medicines:                Monitored Anesthesia Care Complications:            No immediate complications. Estimated Blood Loss:     Estimated blood loss was minimal. Procedure:                Pre-Anesthesia Assessment:                           - Prior to the procedure, a History and Physical                            was performed, and patient medications and                            allergies were reviewed. The patient's tolerance of                            previous anesthesia was also reviewed. The risks                            and benefits of the procedure and the sedation                            options and risks were discussed with the patient.                            All questions were answered, and informed consent                            was obtained. Prior Anticoagulants: The patient has  taken no previous anticoagulant or antiplatelet                            agents. ASA Grade Assessment: II - A patient with                            mild systemic disease. After reviewing the risks                            and benefits, the patient was deemed in                            satisfactory condition to undergo the procedure.  After obtaining informed consent, the endoscope was                            passed under direct vision. Throughout the                            procedure, the patient's blood pressure, pulse, and                            oxygen saturations were monitored continuously. The                            [id number]-H190DL ([id number]) Olympus pediatric colonoscope                            was introduced through the anus and advanced to the                            the cecum, identified by appendiceal orifice and                            ileocecal valve. The GF-[id number]-[id number] ([id number]) Olympus                            Radial EUS scope was introduced through the anus                            and advanced to the the sigmoid colon for                            ultrasound. The GIF-H190 ([id number]) Olympus                            gastroscope was introduced through the anus and                            advanced to the the transverse colon. After                            obtaining informed consent, the endoscope was  passed under direct vision. Throughout the                            procedure, the patient's blood pressure, pulse, and                            oxygen saturations were monitored continuously.The                            lower EUS was accomplished without difficulty. The                            patient tolerated the procedure. The quality of the                            bowel preparation was good. Scope In: 10:08:38 AM Scope Out: 10:55:34 AM Scope Withdrawal Time: 0 hours 44 minutes 48 seconds  Total Procedure Duration: 0 hours 46 minutes 56 seconds  Findings:      ENDOSCOPIC FINDING: :      An ulcerated non-obstructing medium-sized polypoid mass was found in the       distal rectum. The ulceration was centrally located. The mass was       partially circumferential (involving one-third of the lumen       circumference). The mass measured three  cm in length. No bleeding was       present. This had previously been biopsied as an Inflammatory Cap Polyp.       Biopsies were taken with a cold forceps for histology after EUS had been       completed.      Normal mucosa was found in the entire colon otherwise.      Non-bleeding non-thrombosed external and internal hemorrhoids were found       during retroflexion, during perianal exam and during digital exam. The       hemorrhoids were Grade II (internal hemorrhoids that prolapse but reduce       spontaneously).      ENDOSONOGRAPHIC FINDING: :      A mural/intramural lesion was found in the rectum. The lesion was       encountered at 2 cm to 5 cm (from the anal verge). The lesion was       partially circumferential (involving 30% of the lumen). The lesion was       hypoechoic. Sonographically, the origin appeared to be within the       luminal interface/superficial mucosa (Layer 1) and deep mucosa (Layer       2). The endosonographic borders were irregular. There was sonographic       evidence suggesting parenchymal invasion into the deep mucosa (Layer 2)       and submucosa (Layer 3) at the region of the ulceration. An intact       interface was seen between the lesion and the muscularis propria (Layer       4) and perirectal fat (Layer 5) suggesting a lack of invasion.      No malignant-appearing lymph nodes were visualized in the perirectal       region and in the left iliac region. The nodes were.      The perirectal/pericolonic space was normal with intact iliac vessels.  The internal anal sphincter was visualized endosonographically and       appeared normal.      The digital rectal exam findings include palpable rectal lesion and       non-thrombosed external hemorrhoids. Impression:               Colonoscopy Impression:                           - Previously biopsied Inflammatory CAP polyp was                            noted and had central ulceration in the distal                             rectum. Does not have typical appearance of                            adenomatous or villous mass but after EUS, this                            lesion was biopsied to rule out malignancy.                           - Normal mucosa in the entire examined colon                            otherwise.                           - Non-bleeding non-thrombosed external and internal                            hemorrhoids.                           EUS Impression:                           - No malignant-appearing lymph nodes were                            visualized endosonographically in the perirectal                            region and in the left iliac region.                           - The internal anal sphincter was visualized                            endosonographically and appeared normal.                           - A mural lesion was visualized endosonographically  in the rectum. The origin of the lesion appeared to                            be within the luminal interface/superficial mucosa                            (Layer 1) and deep mucosa (Layer 2) but there was                            suggestion of lesion extending into the Submucosa                            (Layer 3 ) at the region of the ulceration in the                            middle of the lesion.                           - Endosonographic images of the perirectal space                            were unremarkable.                           - Palpable rectal lesion and non-thrombosed                            external hemorrhoids found on digital rectal exam. Recommendation:           - The patient will be observed post-procedure,                            until all discharge criteria are met.                           - Discharge patient to home.                           - Patient has a contact number available for                            emergencies. The  signs and symptoms of potential                            delayed complications were discussed with the                            patient. Return to normal activities tomorrow.                            Written discharge instructions were provided to the                            patient.                           -  Resume previous diet.                           - Await path results.                           - Based on pathology, will plan possible referral                            for Advanced Endoscopic approach via ESD vs a                            Colorectal Surgery consultation (patient/family                            want a new Colorectal group for secondary opinion)                            for LAR vs APR depending again on pathology results.                           - The findings and recommendations were discussed                            with the patient.                           - The findings and recommendations were discussed                            with the patient's family. Procedure Code(s):        --- Professional ---                           [phone], 87, Colonoscopy, flexible; with endoscopic                            ultrasound examination limited to the rectum,                            sigmoid, descending, transverse, or ascending colon                            and cecum, and adjacent structures                           45380, 52, Colonoscopy, flexible; with biopsy,                            single or multiple Diagnosis Code(s):        --- Professional ---                           D49.0, Neoplasm of unspecified behavior of  digestive system                           K64.1, Second degree hemorrhoids                           K64.4, Residual hemorrhoidal skin tags                           I89.9, Noninfective disorder of lymphatic vessels                            and lymph nodes, unspecified                            K62.89, Other specified diseases of anus and rectum                           R93.3, Abnormal findings on diagnostic imaging of                            other parts of digestive tract CPT copyright 2019 American Medical Association. All rights reserved. The codes documented in this report are preliminary and upon coder review may  be revised to meet current compliance requirements. Justice Britain, MD 02/26/2020 11:26:18 AM Number of Addenda: 0

## 2020-02-27 LAB — SURGICAL PATHOLOGY

## 2020-02-28 ENCOUNTER — Telehealth: Payer: Self-pay | Admitting: Gastroenterology

## 2020-02-28 NOTE — Telephone Encounter (Signed)
Patient is calling is experiencing soreness\tenderness on her bottom area. She also said she feels like she needs to have a BM but she can't.   Also wants to follow up on referral

## 2020-02-28 NOTE — Telephone Encounter (Signed)
The pt had Lower EUS on 3/29 and she states she has had rectal soreness and tenderness since.  She also states she is not able to have a bowel movement.  She says when she does pass stool it is a very small amount and had bright red blood mixed in.  Please advise

## 2020-02-29 NOTE — Telephone Encounter (Signed)
Referrals have been made, records faxed to [facility]

## 2020-02-29 NOTE — Telephone Encounter (Signed)
I called and spoke with the patient and the patient's husband on 3/30 1 in the evening around 6:30 PM.  We spoke on the phone for approximately 20 minutes. The pathology has returned as a inflammatory Polyp once again.  Due to the ulceration in my EUS showing potential involvement of layer 3 a piecemeal EMR resection would not be ideal.  I discussed her case and the pathology with a few of my previous colleagues at [facility] advanced endoscopy and they do not feel that an attempt at ESD would be reasonable due to the benign biopsies and the EUS findings. It is possible that ESD may not be able to be successful either but they believe as I do that it is worth a attempt. We will move forward with getting the patient a clinic visit with Dr. Hilbert Corrigan at [facility]. I would also like to place a [facility] colorectal surgery referral which can follow Dr. Atlee Abide attempt at ESD if he is unsuccessful for patients and husband's desire for second opinion from a colorectal surgery perspective. The patient's bleeding is somewhat similar to what she has had previously. Discomfort also similar to what she has had prior. I have asked her to take Tylenol as needed and if things persist in regards to pain I would be okay with giving a short course of tramadol for which point we can hopefully help her discomfort until procedure can be completed. The patient and husband are okay with moving forward as soon as possible with neck steps in evaluation and they appreciate the openness and honesty that we have given them at this point in time. I will forward this to the patient's referring provider.  Patty, please move forward with the following plan: 1) please send referral to [facility] advanced endoscopy for Dr. Jeronimo Greaves for ESD consideration (I have already spoken with him personally) 2) please send referral to [facility] colorectal surgery for discussion and second opinion of inflammatory Polyp resection if ESD is not  successful  Thanks.  GM

## 2020-03-06 ENCOUNTER — Encounter: Payer: Self-pay | Admitting: Gastroenterology

## 2020-03-07 ENCOUNTER — Other Ambulatory Visit: Payer: Self-pay | Admitting: Family Medicine

## 2020-03-07 DIAGNOSIS — M545 Low back pain, unspecified: Secondary | ICD-10-CM

## 2020-03-13 ENCOUNTER — Ambulatory Visit: Payer: BC Managed Care – PPO | Attending: Internal Medicine

## 2020-03-13 DIAGNOSIS — Z23 Encounter for immunization: Secondary | ICD-10-CM

## 2020-03-13 NOTE — Progress Notes (Signed)
   Covid-19 Vaccination Clinic  Name:  Angela Gordon    MRN: [id number] DOB: 1971/10/31  03/13/2020  Angela Gordon was observed post Covid-19 immunization for 15 minutes without incident. She was provided with Vaccine Information Sheet and instruction to access the V-Safe system.   Angela Gordon was instructed to call 911 with any severe reactions post vaccine: Marland Kitchen Difficulty breathing  . Swelling of face and throat  . A fast heartbeat  . A bad rash all over body  . Dizziness and weakness   Immunizations Administered    Name Date Dose VIS Date Route   Pfizer COVID-19 Vaccine 03/13/2020 11:29 AM 0.3 mL 11/10/2019 Intramuscular   Manufacturer: [place]   Lot: [id number]   [place]: [id number]

## 2020-03-19 ENCOUNTER — Telehealth: Payer: Self-pay | Admitting: Family Medicine

## 2020-03-19 NOTE — Telephone Encounter (Signed)
Left detailed vm we do not carry these and if it is for colonoscopy she would need to get rx for GI

## 2020-03-19 NOTE — Telephone Encounter (Signed)
Copied from [place] [id number]. Topic: General - Other >> Mar 19, 2020  3:33 PM Keene Breath wrote: Reason for CRM: Patient called to see if the office has a suprep sample kit a colonoscopy.  Her insurance will not pay for it.   Please call to discuss at [phone]

## 2020-03-27 ENCOUNTER — Telehealth: Payer: Self-pay | Admitting: Gastroenterology

## 2020-03-27 NOTE — Telephone Encounter (Signed)
Returned pts call. All questions answered.

## 2020-05-03 ENCOUNTER — Ambulatory Visit: Payer: BC Managed Care – PPO | Admitting: Family Medicine

## 2020-05-03 ENCOUNTER — Other Ambulatory Visit: Payer: Self-pay

## 2020-05-03 ENCOUNTER — Other Ambulatory Visit (HOSPITAL_COMMUNITY)
Admission: RE | Admit: 2020-05-03 | Discharge: 2020-05-03 | Disposition: A | Discharge status: Home / Self Care | Payer: BC Managed Care – PPO | Source: Ambulatory Visit | Attending: Family Medicine | Admitting: Family Medicine

## 2020-05-03 ENCOUNTER — Encounter: Payer: Self-pay | Admitting: Family Medicine

## 2020-05-03 VITALS — BP 136/88 | HR 97 | Temp 97.3°F | Resp 14 | Ht 63.0 in | Wt 106.4 lb

## 2020-05-03 DIAGNOSIS — N761 Subacute and chronic vaginitis: Secondary | ICD-10-CM | POA: Insufficient documentation

## 2020-05-03 DIAGNOSIS — R1084 Generalized abdominal pain: Secondary | ICD-10-CM | POA: Insufficient documentation

## 2020-05-03 DIAGNOSIS — R8290 Unspecified abnormal findings in urine: Secondary | ICD-10-CM

## 2020-05-03 MED ORDER — FLUCONAZOLE 150 MG PO TABS: 150.0000 mg | ORAL_TABLET | ORAL | 0 refills | Status: DC | PRN

## 2020-05-03 NOTE — Progress Notes (Deleted)
Since pt did her bowel prep for colonoscopy, multiple procedures and bowel prep from December to current - she has been wearing panty liners, some increase in vaginal discharge and irritatoin.  She took older metrogel and sx get a little better but she didn't have a full coarse, she feels like sx are starting to come back a little.  Possible vaginal/genital odor she smells it when she urinates.  Vaginal Itching The patient's primary symptoms include genital itching (irritated), a genital odor and vaginal discharge. Episode onset: 6 months    She lost 20 lbs

## 2020-05-03 NOTE — Progress Notes (Signed)
Patient ID: Angela Gordon, female    DOB: 26-Feb-1971, 49 y.o.   MRN: [id number]  PCP: Delsa Grana, [name]  Chief Complaint  Patient presents with  . Follow-up  . Vaginitis    itching from possible all bowel prep    Subjective:   Angela Gordon is a 49 y.o. female, presents to clinic with CC of the following:  HPI  Since pt did her bowel prep for colonoscopy, multiple procedures and bowel prep from December to current - she has been wearing panty liners, some increase in vaginal discharge and irritatoin.  She took older metrogel and sx get a little better but she didn't have a full course, she feels like sx are starting to come back a little.  Possible vaginal/genital odor she smells it when she urinates.  Extensive review today of patient's recent colonoscopy findings and multiple procedures and referrals she has surgery scheduled this next week She lost 20 lbs She has bright red blood per rectum, is afraid to eat because symptoms when she eats she is afraid that she will have bowel movements or bloody stool She has been told that possibly its her head and she does feel anxious about everything that she is experienced and her upcoming procedure.  Patient Active Problem List   Diagnosis Date Noted  . Rectal polyp 01/11/2020  . BRBPR (bright red blood per rectum) 01/11/2020  . Encounter for screening colonoscopy   . Rectal mass   . Osteoporosis 10/06/2019  . Family history of ovarian cancer 07/26/2017  . Vitamin D deficiency 05/07/2016  . Sickle-cell trait ([place]) 05/07/2016  . 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  albuterol (PROVENTIL) (5 MG/ML) 0.5% nebulizer solution, Take 2.5 mg by nebulization as needed for wheezing or shortness of breath., Disp: , Rfl:  .  albuterol (VENTOLIN HFA) 108 (90 Base) MCG/ACT inhaler, Inhale 2 puffs into the lungs every 6 (six) hours as needed for wheezing or shortness of breath., Disp: , Rfl:    .  alendronate-cholecalciferol (FOSAMAX PLUS D) 70-2800 MG-UNIT tablet, Take 1 tablet by mouth every 7 (seven) days. Take with a full glass of water on an empty stomach.  Wednesday, Disp: , Rfl:  .  Cholecalciferol (VITAMIN D3) 125 MCG (5000 UT) TABS, Take 5,000 Units by mouth once a week., Disp: , Rfl:  .  naproxen (NAPROSYN) 500 MG tablet, Take 1 tablet (500 mg total) by mouth 2 (two) times daily as needed for moderate pain., Disp: 60 tablet, Rfl: 1 .  tiZANidine (ZANAFLEX) 4 MG tablet, Take 1 tablet (4 mg total) by mouth every 8 (eight) hours as needed for muscle spasms., Disp: 90 tablet, Rfl: 1   Allergies  Allergen Reactions  . Erythromycin Base Other (See Comments)  . Penicillins Diarrhea and Rash    Did it involve swelling of the face/tongue/throat, SOB, or low BP? Unknown Did it involve sudden or severe rash/hives, skin peeling, or any reaction on the inside of your mouth or nose? Unknown Did you need to seek medical attention at a hospital or doctor's office? Unknown When did it last happen?Childhood If all above answers are "NO", may proceed with cephalosporin use.  Elyse Hsu [Shellfish Allergy] Anaphylaxis  . Erythromycin      Family History  Problem Relation Age of Onset  . Hypertension Mother   . Osteoporosis Mother   . Cervical cancer Mother        Hysterectomy in 60s.  Marland Kitchen  Stroke Mother   . Crohn's disease Mother   . Irritable bowel syndrome Mother   . Sickle cell trait Father   . Sickle cell trait Sister   . Heart disease Maternal Aunt   . Breast cancer Maternal Aunt   . Heart disease Maternal Uncle   . Hypertension Maternal Uncle   . Ovarian cancer Paternal Aunt 13  . Heart disease Maternal Grandmother   . Hypertension Maternal Grandmother   . Osteoporosis Maternal Grandmother   . Rheum arthritis Maternal Grandfather   . Hypertension Maternal Grandfather   . Prostate cancer Maternal Grandfather   . Leukemia Cousin   . Colon cancer Neg Hx   .  Esophageal cancer Neg Hx   . Pancreatic cancer Neg Hx   . Stomach cancer Neg Hx   . Inflammatory bowel disease Neg Hx   . Liver disease Neg Hx   . Rectal cancer Neg Hx      Social History   Socioeconomic History  . Marital status: Married    Spouse name: Not on file  . Number of children: Not on file  . Years of education: Not on file  . Highest education level: Not on file  Occupational History  . Not on file  Tobacco Use  . Smoking status: Never Smoker  . Smokeless tobacco: Never Used  Substance and Sexual Activity  . Alcohol use: Yes    Alcohol/week: 1.0 standard drinks    Types: 1 Glasses of wine per week    Comment: 1 glass of wine every other day  . Drug use: No  . Sexual activity: Yes    Birth control/protection: Surgical  Other Topics Concern  . Not on file  Social History Narrative  . Not on file   Social Determinants of Health   Financial Resource Strain:   . Difficulty of Paying Living Expenses:   Food Insecurity:   . Worried About Charity fundraiser in the Last Year:   . Arboriculturist in the Last Year:   Transportation Needs:   . Film/video editor (Medical):   Marland Kitchen Lack of Transportation (Non-Medical):   Physical Activity:   . Days of Exercise per Week:   . Minutes of Exercise per Session:   Stress:   . Feeling of Stress :   Social Connections:   . Frequency of Communication with Friends and Family:   . Frequency of Social Gatherings with Friends and Family:   . Attends Religious Services:   . Active Member of Clubs or Organizations:   . Attends Archivist Meetings:   Marland Kitchen Marital Status:   Intimate Partner Violence:   . Fear of Current or Ex-Partner:   . Emotionally Abused:   Marland Kitchen Physically Abused:   . Sexually Abused:     Chart Review Today: I personally reviewed active problem list, medication list, allergies, family history, social history, health maintenance, notes from last encounter, lab results, imaging with the  patient/caregiver today.   Review of Systems 10 Systems reviewed and are negative for acute change except as noted in the HPI.     Objective:   Vitals:   05/03/20 1504  BP: 136/88  Pulse: 97  Resp: 14  Temp: (!) 97.3 F (36.3 C)  SpO2: 98%  Weight: 106 lb 6.4 oz (48.3 kg)  Height: 5\' 3"  (1.6 m)    Body mass index is 18.85 kg/m.  Physical Exam Vitals and nursing note reviewed.  Constitutional:  General: She is not in acute distress.    Appearance: Normal appearance. She is normal weight. She is not ill-appearing, toxic-appearing or diaphoretic.  HENT:     Head: Normocephalic and atraumatic.     Right Ear: External ear normal.     Left Ear: External ear normal.  Eyes:     General: No scleral icterus. Cardiovascular:     Rate and Rhythm: Normal rate and regular rhythm.     Pulses: Normal pulses.     Heart sounds: Normal heart sounds.  Pulmonary:     Effort: Pulmonary effort is normal.     Breath sounds: Normal breath sounds.  Abdominal:     General: Abdomen is flat. Bowel sounds are normal.     Tenderness: There is no right CVA tenderness or left CVA tenderness.  Skin:    General: Skin is warm and dry.  Neurological:     Mental Status: She is alert. Mental status is at baseline.     Gait: Gait normal.      Results for orders placed or performed during the hospital encounter of 02/26/20  Surgical pathology  Result Value Ref Range   SURGICAL PATHOLOGY      SURGICAL PATHOLOGY CASE: [id number] PATIENT: [facility] Surgical Pathology Report     Clinical History: rectal mass, rectal cap polyp, R/O malignancy (cm)     FINAL MICROSCOPIC DIAGNOSIS:  A. RECTUM MASS, BIOPSY: - Inflammatory cap polyp. - No dysplasia or malignancy.   GROSS DESCRIPTION:  Received in formalin are tan, soft tissue fragments that are submitted in toto. Number: 4 size: 0.2-0.4 cm blocks: 1 (GRP 02/26/2020)   Final Diagnosis performed by Vicente Males, MD.    Electronically signed 02/27/2020 Technical component performed at [facility]. [facility], [place] [address] [place], [place] [address].  Professional component performed at [facility], [place] [address] [place], [place] [address].  Immunohistochemistry Technical component (if applicable) was performed at [facility]. [address] [place], [place], [place] [address].   IMMUNOHISTOCHEMISTRY DISCLAIMER (if applicable): Some of these immu nohistochemical stains may have been developed and the performance characteristics determine by [facility]. Some may not have been cleared or approved by the U.S. Food and Drug Administration. The FDA has determined that such clearance or approval is not necessary. This test is used for clinical purposes. It should not be regarded as investigational or for research. This laboratory is certified under the [place] (CLIA-88) as qualified to perform high complexity clinical laboratory testing.  The controls stained appropriately.         Assessment & Plan:      ICD-10-CM   1. Subacute vaginitis  N76.1 Urinalysis, Routine w reflex microscopic    Urine Culture    Cervicovaginal ancillary only    fluconazole (DIFLUCAN) 150 MG tablet   recurrent vaginal sx x6 months with multiple procedures / meds, possibly BV or yeast vaginitis- obtain swab, tx positive findings  2. Bad odor of urine  R82.90 Urinalysis, Routine w reflex microscopic    Urine Culture   new urine odor, no dysuria, hematuria or frequency, Ua and urine culture obtained- tx per findings  3. Generalized abdominal pain  R10.84 Urinalysis, Routine w reflex microscopic    Urine Culture   seeing GI and subspecialists and surgeons - anxiety and stress may be causing some of her GI sx        Delsa Grana, [name] 05/03/20 3:14 PM

## 2020-05-05 LAB — URINALYSIS, ROUTINE W REFLEX MICROSCOPIC
Bacteria, UA: NONE SEEN /HPF
Bilirubin Urine: NEGATIVE
Glucose, UA: NEGATIVE
Hgb urine dipstick: NEGATIVE
Hyaline Cast: NONE SEEN /LPF
Ketones, ur: NEGATIVE
Nitrite: NEGATIVE
Protein, ur: NEGATIVE
RBC / HPF: NONE SEEN /HPF (ref 0–2)
Specific Gravity, Urine: 1.01 (ref 1.001–1.03)
pH: 6.5 (ref 5.0–8.0)

## 2020-05-05 LAB — URINE CULTURE
MICRO NUMBER:: 10555749
SPECIMEN QUALITY:: ADEQUATE

## 2020-05-07 LAB — CERVICOVAGINAL ANCILLARY ONLY
Bacterial Vaginitis (gardnerella): POSITIVE — AB
Candida Glabrata: NEGATIVE
Candida Vaginitis: NEGATIVE
Comment: NEGATIVE
Comment: NEGATIVE
Comment: NEGATIVE

## 2020-05-08 ENCOUNTER — Encounter: Payer: Self-pay | Admitting: Family Medicine

## 2020-05-08 MED ORDER — METRONIDAZOLE 0.75 % VA GEL: 1.0000 | Freq: Two times a day (BID) | VAGINAL | 0 refills | Status: DC

## 2020-05-09 HISTORY — PX: COLONOSCOPY W/ BIOPSIES: SHX1374

## 2020-05-10 MED ORDER — CEPHALEXIN 500 MG PO CAPS: 500.0000 mg | ORAL_CAPSULE | Freq: Three times a day (TID) | ORAL | 0 refills | Status: DC

## 2020-05-30 ENCOUNTER — Encounter: Payer: Self-pay | Admitting: Family Medicine

## 2020-05-30 ENCOUNTER — Ambulatory Visit: Payer: BC Managed Care – PPO | Admitting: Family Medicine

## 2020-05-30 ENCOUNTER — Other Ambulatory Visit: Payer: Self-pay

## 2020-05-30 VITALS — BP 114/80 | HR 92 | Temp 97.5°F | Resp 14 | Ht 62.0 in | Wt 103.0 lb

## 2020-05-30 DIAGNOSIS — E538 Deficiency of other specified B group vitamins: Secondary | ICD-10-CM

## 2020-05-30 DIAGNOSIS — Z5181 Encounter for therapeutic drug level monitoring: Secondary | ICD-10-CM

## 2020-05-30 DIAGNOSIS — D649 Anemia, unspecified: Secondary | ICD-10-CM | POA: Diagnosis not present

## 2020-05-30 DIAGNOSIS — E46 Unspecified protein-calorie malnutrition: Secondary | ICD-10-CM

## 2020-05-30 DIAGNOSIS — K529 Noninfective gastroenteritis and colitis, unspecified: Secondary | ICD-10-CM | POA: Diagnosis not present

## 2020-05-30 DIAGNOSIS — K909 Intestinal malabsorption, unspecified: Secondary | ICD-10-CM

## 2020-05-30 DIAGNOSIS — Z09 Encounter for follow-up examination after completed treatment for conditions other than malignant neoplasm: Secondary | ICD-10-CM

## 2020-05-30 DIAGNOSIS — D62 Acute posthemorrhagic anemia: Secondary | ICD-10-CM

## 2020-05-30 DIAGNOSIS — E559 Vitamin D deficiency, unspecified: Secondary | ICD-10-CM

## 2020-05-30 NOTE — Progress Notes (Signed)
Name: Angela Gordon   MRN: [id number]    DOB: 07/22/1971   Date:05/30/2020       Progress Note  Chief Complaint  Patient presents with  . Hospitalization Follow-up    rectal bleeding, GI bleed due to polyp     Subjective:   Angela Gordon is a 49 y.o. female, presents to clinic for follow-up after multiple procedures and hospitalization  Hospital follow up after multiple GI procedures, GI bleeding Admitted to [facility] [place] [facility] 6/17-6/25  She had flex sig 2x, she had clamps placed, and was observed and had further hematochezia and syncopal episode she is suspected to have some malabsorption with multiple deficiencies.  She does have follow-up with gastroenterology but it is in August.  [place] records extensively today eventually found the notation of what they wanted repeated here at primary care asked for CMP, CBC, folic acid, Y86 and vitamin D be rechecked Patient states she is getting B12 shots daily during her hospitalization, she was told to do shots at home but she cannot do this, she will not see GI for about 2 months.  Visit Diagnoses - documented in this encounter Diagnosis  BRBPR (bright red blood per rectum) - Primary  Hemorrhage of rectum and anus   Rectal polyp  Anal and rectal polyp   Weight loss, abnormal   V78 deficiency   Folic acid deficiency  Other B-complex deficiencies   Vitamin D deficiency    Patient has not had any blood in stool since being released from the hospital, she denies abdominal pain currently.  She does feel weak and she has lost more weight since our last visit. She did meet with a dietitian who reviewed recommended diet from GI specialist She is here with her husband today they have their discharge paperwork and there is a list of supplements that they need to get so she can start taking.  She is compliant with the stool softeners.    Current Outpatient Medications:  .  acetaminophen (TYLENOL) 325 MG tablet, Take by mouth.,  Disp: , Rfl:  .  albuterol (VENTOLIN HFA) 108 (90 Base) MCG/ACT inhaler, Inhale 2 puffs into the lungs every 6 (six) hours as needed for wheezing or shortness of breath., Disp: , Rfl:  .  alendronate-cholecalciferol (FOSAMAX PLUS D) 70-2800 MG-UNIT tablet, Take 1 tablet by mouth every 7 (seven) days. Take with a full glass of water on an empty stomach.  Wednesday, Disp: , Rfl:  .  Cholecalciferol (VITAMIN D3) 125 MCG (5000 UT) TABS, Take 5,000 Units by mouth once a week., Disp: , Rfl:  .  cyanocobalamin (,VITAMIN B-12,) 1000 MCG/ML injection, Inject into the muscle., Disp: , Rfl:  .  folic acid (FOLVITE) 1 MG tablet, Take by mouth., Disp: , Rfl:  .  metroNIDAZOLE (METROGEL VAGINAL) 0.75 % vaginal gel, Place 1 Applicatorful vaginally 2 (two) times daily., Disp: 70 g, Rfl: 0 .  senna-docusate (SENOKOT-S) 8.6-50 MG tablet, Take by mouth., Disp: , Rfl:  .  thiamine 100 MG tablet, Take by mouth., Disp: , Rfl:  .  albuterol (PROVENTIL) (5 MG/ML) 0.5% nebulizer solution, Take 2.5 mg by nebulization as needed for wheezing or shortness of breath. (Patient not taking: Reported on 05/30/2020), Disp: , Rfl:  .  cephALEXin (KEFLEX) 500 MG capsule, Take 1 capsule (500 mg total) by mouth 3 (three) times daily. (Patient not taking: Reported on 05/30/2020), Disp: 15 capsule, Rfl: 0 .  fluconazole (DIFLUCAN) 150 MG tablet, Take 1 tablet (150 mg total)  by mouth every 3 (three) days as needed (for vaginal itching/yeast infection sx). (Patient not taking: Reported on 05/30/2020), Disp: 2 tablet, Rfl: 0 .  naproxen (NAPROSYN) 500 MG tablet, Take 1 tablet (500 mg total) by mouth 2 (two) times daily as needed for moderate pain. (Patient not taking: Reported on 05/30/2020), Disp: 60 tablet, Rfl: 1 .  tiZANidine (ZANAFLEX) 4 MG tablet, Take 1 tablet (4 mg total) by mouth every 8 (eight) hours as needed for muscle spasms. (Patient not taking: Reported on 05/30/2020), Disp: 90 tablet, Rfl: 1  Patient Active Problem List   Diagnosis Date  Noted  . Rectal polyp 01/11/2020  . BRBPR (bright red blood per rectum) 01/11/2020  . Encounter for screening colonoscopy   . Rectal mass   . Osteoporosis 10/06/2019  . Family history of ovarian cancer 07/26/2017  . Vitamin D deficiency 05/07/2016  . Sickle-cell trait ([place]) 05/07/2016  . 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 ABDOMINAL HYSTERECTOMY  2010   total -Menopausal symptoms, takes HRT  . BIOPSY  02/26/2020   Procedure: BIOPSY;  Surgeon: Rush Landmark Telford Nab., MD;  Location: [place];  Service: Gastroenterology;;  . BREAST EXCISIONAL BIOPSY Left 2003   neg bx dr Jamal Collin  . BREAST SURGERY Left 2004   biopsy, benign  . COLONOSCOPY WITH PROPOFOL N/A 11/29/2019   Procedure: COLONOSCOPY WITH PROPOFOL;  Surgeon: Virgel Manifold, MD;  Location: [facility];  Service: Endoscopy;  Laterality: N/A;  . COLONOSCOPY WITH PROPOFOL N/A 02/26/2020   Procedure: COLONOSCOPY WITH PROPOFOL;  Surgeon: Rush Landmark Telford Nab., MD;  Location: [place];  Service: Gastroenterology;  Laterality: N/A;  . CYST REMOVAL HAND Right 2005-06  . EUS  02/26/2020   Procedure: LOWER ENDOSCOPIC ULTRASOUND (EUS);  Surgeon: Irving Copas., MD;  Location: [place];  Service: Gastroenterology;;  . HERNIA REPAIR  [id number]   umbilical  . TONSILLECTOMY  1978    Family History  Problem Relation Age of Onset  . Hypertension Mother   . Osteoporosis Mother   . Cervical cancer Mother        Hysterectomy in 9s.  . Stroke Mother   . Crohn's disease Mother   . Irritable bowel syndrome Mother   . Sickle cell trait Father   . Sickle cell trait Sister   . Heart disease Maternal Aunt   . Breast cancer Maternal Aunt   . Heart disease Maternal Uncle   . Hypertension Maternal Uncle   . Ovarian cancer Paternal Aunt 61  . Heart disease Maternal Grandmother   . Hypertension Maternal Grandmother   . Osteoporosis Maternal Grandmother   .  Rheum arthritis Maternal Grandfather   . Hypertension Maternal Grandfather   . Prostate cancer Maternal Grandfather   . Leukemia Cousin   . Colon cancer Neg Hx   . Esophageal cancer Neg Hx   . Pancreatic cancer Neg Hx   . Stomach cancer Neg Hx   . Inflammatory bowel disease Neg Hx   . Liver disease Neg Hx   . Rectal cancer Neg Hx     Social History   Tobacco Use  . Smoking status: Never Smoker  . Smokeless tobacco: Never Used  Vaping Use  . Vaping Use: Never used  Substance Use Topics  . Alcohol use: Yes    Alcohol/week: 1.0 standard drink    Types: 1 Glasses of wine per week    Comment: 1 glass of wine every other day  . Drug use:  No     Allergies  Allergen Reactions  . Erythromycin Base Other (See Comments)  . Penicillins Diarrhea and Rash    Did it involve swelling of the face/tongue/throat, SOB, or low BP? Unknown Did it involve sudden or severe rash/hives, skin peeling, or any reaction on the inside of your mouth or nose? Unknown Did you need to seek medical attention at a hospital or doctor's office? Unknown When did it last happen?Childhood If all above answers are "NO", may proceed with cephalosporin use.  Elyse Hsu [Shellfish Allergy] Anaphylaxis  . Erythromycin     Health Maintenance  Topic Date Due  . Hepatitis C Screening  Never done  . HIV Screening  05/03/2021 (Originally 03/20/1987)  . INFLUENZA VACCINE  06/30/2020  . MAMMOGRAM  09/07/2020  . DEXA SCAN  11/05/2021  . TETANUS/TDAP  05/08/2028  . COVID-19 Vaccine  Completed  . PAP SMEAR-Modifier  Discontinued    Chart Review Today: I personally reviewed active problem list, medication list, allergies, family history, social history, health maintenance, notes from last encounter, lab results, imaging with the patient/caregiver today. Extensive chart review through care everywhere of hospitalization, labs and trends, discharge summary, GI consults, procedure notes etc.  Review of Systems    Constitutional: Negative.   HENT: Negative.   Eyes: Negative.   Respiratory: Negative.   Cardiovascular: Negative.   Gastrointestinal: Negative.   Endocrine: Negative.   Genitourinary: Negative.   Musculoskeletal: Negative.   Skin: Negative.   Allergic/Immunologic: Negative.   Neurological: Negative.   Hematological: Negative.   Psychiatric/Behavioral: Negative.   All other systems reviewed and are negative.    Objective:   Vitals:   05/30/20 1129 05/30/20 1141  BP: 114/80   Pulse: (!) 107 92  Resp: 14   Temp: (!) 97.5 F (36.4 C)   SpO2: 100% 98%  Weight: 103 lb (46.7 kg)   Height: 5\' 2"  (1.575 m)     Body mass index is 18.84 kg/m.  Physical Exam Vitals and nursing note reviewed.  Constitutional:      General: She is not in acute distress.    Appearance: Normal appearance. She is well-developed. She is not ill-appearing, toxic-appearing or diaphoretic.     Interventions: Face mask in place.  HENT:     Head: Normocephalic and atraumatic.     Right Ear: External ear normal.     Left Ear: External ear normal.  Eyes:     General: Lids are normal. No scleral icterus.       Right eye: No discharge.        Left eye: No discharge.     Conjunctiva/sclera: Conjunctivae normal.  Neck:     Trachea: Phonation normal. No tracheal deviation.  Cardiovascular:     Rate and Rhythm: Normal rate and regular rhythm.     Pulses: Normal pulses.          Radial pulses are 2+ on the right side and 2+ on the left side.       Posterior tibial pulses are 2+ on the right side and 2+ on the left side.     Heart sounds: Normal heart sounds. No murmur heard.  No friction rub. No gallop.   Pulmonary:     Effort: Pulmonary effort is normal. No respiratory distress.     Breath sounds: Normal breath sounds. No stridor. No wheezing, rhonchi or rales.  Chest:     Chest wall: No tenderness.  Abdominal:     General: Bowel sounds are  normal. There is no distension.     Palpations: Abdomen  is soft.     Tenderness: There is no abdominal tenderness.  Musculoskeletal:        General: No deformity.     Right lower leg: No edema.     Left lower leg: No edema.  Skin:    General: Skin is warm and dry.     Coloration: Skin is not jaundiced or pale.     Findings: No rash.  Neurological:     Mental Status: She is alert and oriented to person, place, and time. Mental status is at baseline.     Motor: No abnormal muscle tone.     Gait: Gait normal.  Psychiatric:        Mood and Affect: Mood normal.        Speech: Speech normal.        Behavior: Behavior normal.         Assessment & Plan:     ICD-10-CM   1. IBD (inflammatory bowel disease)  Q25.9 COMPLETE METABOLIC PANEL WITH GFR    CBC with Differential/Platelet    VITAMIN D 25 Hydroxy (Vit-D Deficiency, Fractures)    B12 and Folate Panel    Iron, TIBC and Ferritin Panel   Suspected by GI specialist she is following diet and has follow-up with GI next month  2. Protein-calorie malnutrition, unspecified severity ([place])  D63 COMPLETE METABOLIC PANEL WITH GFR   Patient has lost weight, appears thin today, working with a dietitian  3. B12 deficiency  E53.8 CBC with Differential/Platelet    B12 and Folate Panel   Patient unable to give herself B12 injection offered for her to come to our clinic for monthly injections, plan may change based on lab results  4. Anemia, unspecified type  D64.9 CBC with Differential/Platelet    B12 and Folate Panel    Iron, TIBC and Ferritin Panel   Recheck CBC, last h[facility]h in [facility] hospital was 9.4, not transfused, recheck today, she has GI f/up  5. Acute blood loss anemia  D62 CBC with Differential/Platelet    Iron, TIBC and Ferritin Panel   see above   6. Vitamin D deficiency  O75.6 COMPLETE METABOLIC PANEL WITH GFR    VITAMIN D 25 Hydroxy (Vit-D Deficiency, Fractures)   Recheck labs per [facility] discharge summary, patient on oral supplement  7. Intestinal malabsorption, unspecified type  K90.9  VITAMIN D 25 Hydroxy (Vit-D Deficiency, Fractures)    B12 and Folate Panel    Iron, TIBC and Ferritin Panel   Patient was instructed to supplement with folate, thiamine, B12, vitamin D, she has follow-up with GI specialist next month  8. Hypocalcemia  E33.29 COMPLETE METABOLIC PANEL WITH GFR   Recommended follow-up and recheck of calcium per [facility], recheck CMP today  9. Encounter for examination following treatment at hospital  J18 COMPLETE METABOLIC PANEL WITH GFR    CBC with Differential/Platelet   Spent a significant amount of time today with the patient reviewing hospital course records and follow-up plan, through [place] care everywhere  10. Encounter for medication monitoring  A41.66 COMPLETE METABOLIC PANEL WITH GFR    CBC with Differential/Platelet    VITAMIN D 25 Hydroxy (Vit-D Deficiency, Fractures)    B12 and Folate Panel    Iron, TIBC and Ferritin Panel    Patient endorses generalized fatigue and exercise intolerance.  She does have a history of sickle cell and with multiple deficiencies and acute blood loss with anemia  explained that it would be a very gradual process for her body to rebuild and other than taking her supplements, working on good nutrition, not much else to do but wait for time for her body to rebuild her blood stores.  We will continue to work with the dietitian and the GI specialist.  We will do the B12 shot here 1 month from her last, the following month she will have GI follow-up who can advise if she needs to continue this. I did explain that all of these labs will likely be rechecked by GI and addressed if she needs infusions etc. that the GI specialist will address that at his visits.   Return in about 3 months (around 08/30/2020) for Routine follow-up,   Nurse visit B-12 shot July 23rd.   Delsa Grana, [name] 05/30/20 12:00 PM

## 2020-05-31 ENCOUNTER — Encounter: Payer: Self-pay | Admitting: Family Medicine

## 2020-05-31 DIAGNOSIS — D649 Anemia, unspecified: Secondary | ICD-10-CM | POA: Insufficient documentation

## 2020-05-31 DIAGNOSIS — E46 Unspecified protein-calorie malnutrition: Secondary | ICD-10-CM | POA: Insufficient documentation

## 2020-05-31 DIAGNOSIS — E538 Deficiency of other specified B group vitamins: Secondary | ICD-10-CM | POA: Insufficient documentation

## 2020-05-31 DIAGNOSIS — K909 Intestinal malabsorption, unspecified: Secondary | ICD-10-CM | POA: Insufficient documentation

## 2020-05-31 DIAGNOSIS — K529 Noninfective gastroenteritis and colitis, unspecified: Secondary | ICD-10-CM | POA: Insufficient documentation

## 2020-05-31 LAB — CBC WITH DIFFERENTIAL/PLATELET
Absolute Monocytes: 557 cells/uL (ref 200–950)
Basophils Absolute: 51 cells/uL (ref 0–200)
Basophils Relative: 0.8 %
Eosinophils Absolute: 77 cells/uL (ref 15–500)
Eosinophils Relative: 1.2 %
HCT: 31.2 % — ABNORMAL LOW (ref 35.0–45.0)
Hemoglobin: 10.3 g/dL — ABNORMAL LOW (ref 11.7–15.5)
Lymphs Abs: 1658 cells/uL (ref 850–3900)
MCH: 31.8 pg (ref 27.0–33.0)
MCHC: 33 g/dL (ref 32.0–36.0)
MCV: 96.3 fL (ref 80.0–100.0)
MPV: 9.1 fL (ref 7.5–12.5)
Monocytes Relative: 8.7 %
Neutro Abs: 4058 cells/uL (ref 1500–7800)
Neutrophils Relative %: 63.4 %
Platelets: 385 10*3/uL (ref 140–400)
RBC: 3.24 10*6/uL — ABNORMAL LOW (ref 3.80–5.10)
RDW: 12.2 % (ref 11.0–15.0)
Total Lymphocyte: 25.9 %
WBC: 6.4 10*3/uL (ref 3.8–10.8)

## 2020-05-31 LAB — COMPLETE METABOLIC PANEL WITH GFR
AG Ratio: 1.6 (calc) (ref 1.0–2.5)
ALT: 33 U/L — ABNORMAL HIGH (ref 6–29)
AST: 24 U/L (ref 10–35)
Albumin: 4.1 g/dL (ref 3.6–5.1)
Alkaline phosphatase (APISO): 49 U/L (ref 31–125)
BUN: 10 mg/dL (ref 7–25)
CO2: 30 mmol/L (ref 20–32)
Calcium: 9.9 mg/dL (ref 8.6–10.2)
Chloride: 105 mmol/L (ref 98–110)
Creat: 0.72 mg/dL (ref 0.50–1.10)
GFR, Est African American: 115 mL/min/{1.73_m2} (ref >=60)
GFR, Est Non African American: 99 mL/min/{1.73_m2} (ref >=60)
Globulin: 2.5 g/dL (calc) (ref 1.9–3.7)
Glucose, Bld: 86 mg/dL (ref 65–99)
Potassium: 4.9 mmol/L (ref 3.5–5.3)
Sodium: 142 mmol/L (ref 135–146)
Total Bilirubin: 0.4 mg/dL (ref 0.2–1.2)
Total Protein: 6.6 g/dL (ref 6.1–8.1)

## 2020-05-31 LAB — B12 AND FOLATE PANEL
Folate: >24 ng/mL
Vitamin B-12: 1286 pg/mL — ABNORMAL HIGH (ref 200–1100)

## 2020-05-31 LAB — IRON,TIBC AND FERRITIN PANEL
%SAT: 19 % (calc) (ref 16–45)
Ferritin: 19 ng/mL (ref 16–232)
Iron: 50 ug/dL (ref 40–190)
TIBC: 270 mcg/dL (calc) (ref 250–450)

## 2020-05-31 LAB — VITAMIN D 25 HYDROXY (VIT D DEFICIENCY, FRACTURES): Vit D, 25-Hydroxy: 32 ng/mL (ref 30–100)

## 2020-06-25 ENCOUNTER — Ambulatory Visit (INDEPENDENT_AMBULATORY_CARE_PROVIDER_SITE_OTHER): Payer: BC Managed Care – PPO

## 2020-06-25 ENCOUNTER — Other Ambulatory Visit: Payer: Self-pay

## 2020-06-25 DIAGNOSIS — E538 Deficiency of other specified B group vitamins: Secondary | ICD-10-CM | POA: Diagnosis not present

## 2020-06-25 MED ORDER — CYANOCOBALAMIN 1000 MCG/ML IJ SOLN
1000.0000 ug | INTRAMUSCULAR | Status: DC
  Administered 2020-06-25 – 2021-01-02 (×5): 1000 ug via INTRAMUSCULAR

## 2020-06-25 NOTE — Progress Notes (Signed)
Patient came in for her B-12 injection. She tolerated it well. NKDA.   

## 2020-06-25 NOTE — Telephone Encounter (Signed)
Patient came by office to get B12 injection. She mentioned she does not "feel like myself". Patient states ever since her stroke she feels like she can not remember things. Patient states she had a meeting the other day at work and had to ask people talking to repeat themselves so she could understand. She would like a referral to a specialist to be evaluated. Patient also would like a refill of her miralax, I have pended the order.

## 2020-06-28 ENCOUNTER — Encounter: Payer: Self-pay | Admitting: Family Medicine

## 2020-06-28 MED ORDER — POLYETHYLENE GLYCOL 3350 17 GM/SCOOP PO POWD: 17.0000 g | Freq: Every day | ORAL | 1 refills | Status: DC | PRN

## 2020-06-28 NOTE — Telephone Encounter (Signed)
I just sent in

## 2020-07-25 ENCOUNTER — Other Ambulatory Visit (INDEPENDENT_AMBULATORY_CARE_PROVIDER_SITE_OTHER): Payer: BC Managed Care – PPO

## 2020-07-25 ENCOUNTER — Ambulatory Visit (INDEPENDENT_AMBULATORY_CARE_PROVIDER_SITE_OTHER): Payer: BC Managed Care – PPO | Admitting: Gastroenterology

## 2020-07-25 ENCOUNTER — Encounter: Payer: Self-pay | Admitting: Gastroenterology

## 2020-07-25 VITALS — BP 116/64 | HR 84 | Ht 62.0 in | Wt 104.6 lb

## 2020-07-25 DIAGNOSIS — K621 Rectal polyp: Secondary | ICD-10-CM

## 2020-07-25 DIAGNOSIS — R634 Abnormal weight loss: Secondary | ICD-10-CM

## 2020-07-25 DIAGNOSIS — R933 Abnormal findings on diagnostic imaging of other parts of digestive tract: Secondary | ICD-10-CM

## 2020-07-25 DIAGNOSIS — K5140 Inflammatory polyps of colon without complications: Secondary | ICD-10-CM | POA: Diagnosis not present

## 2020-07-25 DIAGNOSIS — Z86010 Personal history of colonic polyps: Secondary | ICD-10-CM | POA: Diagnosis not present

## 2020-07-25 DIAGNOSIS — K625 Hemorrhage of anus and rectum: Secondary | ICD-10-CM | POA: Diagnosis not present

## 2020-07-25 DIAGNOSIS — Z862 Personal history of diseases of the blood and blood-forming organs and certain disorders involving the immune mechanism: Secondary | ICD-10-CM

## 2020-07-25 DIAGNOSIS — K5900 Constipation, unspecified: Secondary | ICD-10-CM | POA: Diagnosis not present

## 2020-07-25 LAB — COMPREHENSIVE METABOLIC PANEL
ALT: 15 U/L (ref 0–35)
AST: 17 U/L (ref 0–37)
Albumin: 4.6 g/dL (ref 3.5–5.2)
Alkaline Phosphatase: 50 U/L (ref 39–117)
BUN: 16 mg/dL (ref 6–23)
CO2: 27 mEq/L (ref 19–32)
Calcium: 9.6 mg/dL (ref 8.4–10.5)
Chloride: 105 mEq/L (ref 96–112)
Creatinine, Ser: 0.79 mg/dL (ref 0.40–1.20)
GFR: 93.85 mL/min (ref >60.00)
Glucose, Bld: 88 mg/dL (ref 70–99)
Potassium: 4.2 mEq/L (ref 3.5–5.1)
Sodium: 139 mEq/L (ref 135–145)
Total Bilirubin: 0.8 mg/dL (ref 0.2–1.2)
Total Protein: 7.9 g/dL (ref 6.0–8.3)

## 2020-07-25 LAB — IGA: IgA: 301 mg/dL (ref 68–378)

## 2020-07-25 LAB — IBC + FERRITIN
Ferritin: 5.3 ng/mL — ABNORMAL LOW (ref 10.0–291.0)
Iron: 34 ug/dL — ABNORMAL LOW (ref 42–145)
Saturation Ratios: 7.9 % — ABNORMAL LOW (ref 20.0–50.0)
Transferrin: 308 mg/dL (ref 212.0–360.0)

## 2020-07-25 LAB — CBC
HCT: 38.2 % (ref 36.0–46.0)
Hemoglobin: 12.6 g/dL (ref 12.0–15.0)
MCHC: 33.1 g/dL (ref 30.0–36.0)
MCV: 87.7 fl (ref 78.0–100.0)
Platelets: 232 10*3/uL (ref 150.0–400.0)
RBC: 4.36 Mil/uL (ref 3.87–5.11)
RDW: 14 % (ref 11.5–15.5)
WBC: 6.5 10*3/uL (ref 4.0–10.5)

## 2020-07-25 LAB — TSH: TSH: 0.76 u[IU]/mL (ref 0.35–4.50)

## 2020-07-25 LAB — CORTISOL: Cortisol, Plasma: 6.9 ug/dL

## 2020-07-25 NOTE — Progress Notes (Signed)
[place] VISIT   Primary Care Provider Delsa Grana, [name] [address] [place] [address] [phone]  Referring Provider Dr. Bonna Gains & Dr. Dema Severin  Patient Profile: Angela Gordon is a 49 y.o. 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  The patient presents to the [facility] for an evaluation and management of problem(s) noted below:  Problem List 1. Cap polyposis ([place])   2. Rectal polyp   3. Rectal bleeding   4. Unintentional weight loss   5. Constipation, unspecified constipation type   6. History of anemia   7. Abnormal colonoscopy     History of Present Illness Please see initial consultation note for full details of HPI.  Interval History Today the patient is presenting for follow-up.  Her history has been reviewed.  After my last attempt at potential resection with which my endoscopic ultrasound suggested potentially submucosal involvement, did not feel a piecemeal resection was adequate for this patient.  ESD referral was placed.  My former colleague, Dr. Leroy Sea, met the patient and proceeded with a resection in June of this year.  A nearly 5-hour ESD was performed.  Pathology confirmed a rectal polyp with reactive epithelial changes and chronic inflammation and smooth muscle stranding compatible with an inflammatory Pseudopolyp with prolapse.  Patient was able to be discharged.  She did well for a few days.  However she began to have rectal bleeding.  She ended up returning to [facility] and underwent observation admission with evaluation finding need for further cauterization and Endo Clip placement.  Unfortunately she had continued bleeding and was transferred to the ICU and subsequently required reintervention 2 days later with significant hemoclips  and hemostatic spray being performed.  She had clinical stability and was able to be discharged.  The patient was planned a repeat evaluation approximately 3 months after her procedure however she describes being told that she is not going to have that procedure done at [facility] due to certain issues.  Also there seems to have been some communication issues concerns in regards to the follow-up plan from [facility] perspective for the patient in regards to how significant bleeding she had.  As such she is seeking follow-up here.  The patient has done relatively well.  The amount of bleeding and spotting that she had previously has significantly improved.  However, she is still having some episodes of rectal bleeding and urgency.  Should continues to have issues of constipation.  The patient has also noted some increasing pica symptoms and it would course the last few weeks is eating ice significantly.  She wonders if she could have iron deficiency.  The patient has had a continued weight loss unintentionally with an unclear etiology.  They have been concerned as to whether she was having malabsorption however she is not having diarrhea so in theory cannot be having malabsorption.  She is eating what she wants when she wants but the weight continues to decrease.  GI Review of Systems Positive as above Negative for odynophagia, dysphagia, pyrosis, abdominal pain, melena  Review of Systems General: Denies fevers/chills HEENT: Denies oral lesions Cardiovascular: Denies chest pain Pulmonary: Denies shortness of breath Gastroenterological: See HPI Genitourinary: Denies darkened urine or hematuria Hematological: History previously for easy bruising/bleeding Dermatological: Denies jaundice Psychological: Mood is less anxious than previous but obviously still having concerned in the setting  of having persistent rectal bleeding   Medications Current Outpatient Medications  Medication Sig Dispense Refill  .  albuterol (PROVENTIL) (5 MG/ML) 0.5% nebulizer solution Take 2.5 mg by nebulization as needed for wheezing or shortness of breath.     Marland Kitchen albuterol (VENTOLIN HFA) 108 (90 Base) MCG/ACT inhaler Inhale 2 puffs into the lungs every 6 (six) hours as needed for wheezing or shortness of breath.    Marland Kitchen alendronate-cholecalciferol (FOSAMAX PLUS D) 70-2800 MG-UNIT tablet Take 1 tablet by mouth every 7 (seven) days. Take with a full glass of water on an empty stomach.  Wednesday    . Cholecalciferol (VITAMIN D3) 125 MCG (5000 UT) TABS Take 5,000 Units by mouth once a week.    . metroNIDAZOLE (METROGEL VAGINAL) 0.75 % vaginal gel Place 1 Applicatorful vaginally 2 (two) times daily. 70 g 0  . polyethylene glycol powder (GLYCOLAX/MIRALAX) 17 GM/SCOOP powder Take 17 g by mouth daily as needed (constipation). 3350 g 1   Current Facility-Administered Medications  Medication Dose Route Frequency Provider Last Rate Last Admin  . cyanocobalamin ((VITAMIN B-12)) injection 1,000 mcg  1,000 mcg Intramuscular Q30 days Steele Sizer, MD   1,000 mcg at 06/25/20 0901    Allergies Allergies  Allergen Reactions  . Erythromycin Base Other (See Comments)  . Penicillins Diarrhea and Rash    Did it involve swelling of the face/tongue/throat, SOB, or low BP? Unknown Did it involve sudden or severe rash/hives, skin peeling, or any reaction on the inside of your mouth or nose? Unknown Did you need to seek medical attention at a hospital or doctor's office? Unknown When did it last happen?Childhood If all above answers are "NO", may proceed with cephalosporin use.  Elyse Hsu [Shellfish Allergy] Anaphylaxis  . Erythromycin     Histories Past Medical History:  Diagnosis Date  . Asthma    Uses rescue inhaler occasionally.  . Complication of anesthesia    slow to wake up  . Headache   . Heart murmur    was told she had MVP when she was around 24 years, she states she never followed up with that and has never had  any issues from it  . History of blood transfusion 1992   Blood Loss from Toxemia of Pregnancy  . Hypoglycemia   . Osteoporosis    Strong FHx of Osteoporosis.  Marland Kitchen PONV (postoperative nausea and vomiting)   . Scoliosis    Seen Dr. Jefm Bryant  . Sickle-cell trait ([place])   . Stroke [facility]) 1992   Toxemia of Pregnancy    Past Surgical History:  Procedure Laterality Date  . ABDOMINAL HYSTERECTOMY  2010   total -Menopausal symptoms, takes HRT  . BIOPSY  02/26/2020   Procedure: BIOPSY;  Surgeon: Rush Landmark Telford Nab., MD;  Location: [place];  Service: Gastroenterology;;  . BREAST EXCISIONAL BIOPSY Left 2003   neg bx dr Jamal Collin  . BREAST SURGERY Left 2004   biopsy, benign  . COLONOSCOPY WITH PROPOFOL N/A 11/29/2019   Procedure: COLONOSCOPY WITH PROPOFOL;  Surgeon: Virgel Manifold, MD;  Location: [facility];  Service: Endoscopy;  Laterality: N/A;  . COLONOSCOPY WITH PROPOFOL N/A 02/26/2020   Procedure: COLONOSCOPY WITH PROPOFOL;  Surgeon: Rush Landmark Telford Nab., MD;  Location: [place];  Service: Gastroenterology;  Laterality: N/A;  . CYST REMOVAL HAND Right 2005-06  . EUS  02/26/2020   Procedure: LOWER ENDOSCOPIC ULTRASOUND (EUS);  Surgeon: Irving Copas., MD;  Location: [place];  Service: Gastroenterology;;  . HERNIA REPAIR  2004  umbilical  . TONSILLECTOMY  1978   Social History   Socioeconomic History  . Marital status: Married    Spouse name: Not on file  . Number of children: Not on file  . Years of education: Not on file  . Highest education level: Not on file  Occupational History  . Not on file  Tobacco Use  . Smoking status: Never Smoker  . Smokeless tobacco: Never Used  Vaping Use  . Vaping Use: Never used  Substance and Sexual Activity  . Alcohol use: Yes    Alcohol/week: 1.0 standard drink    Types: 1 Glasses of wine per week    Comment: 1 glass of wine every other day  . Drug use: No  . Sexual activity: Yes    Birth  control/protection: Surgical  Other Topics Concern  . Not on file  Social History Narrative  . Not on file   Social Determinants of Health   Financial Resource Strain:   . Difficulty of Paying Living Expenses: Not on file  Food Insecurity:   . Worried About Charity fundraiser in the Last Year: Not on file  . Ran Out of Food in the Last Year: Not on file  Transportation Needs:   . Lack of Transportation (Medical): Not on file  . Lack of Transportation (Non-Medical): Not on file  Physical Activity:   . Days of Exercise per Week: Not on file  . Minutes of Exercise per Session: Not on file  Stress:   . Feeling of Stress : Not on file  Social Connections:   . Frequency of Communication with Friends and Family: Not on file  . Frequency of Social Gatherings with Friends and Family: Not on file  . Attends Religious Services: Not on file  . Active Member of Clubs or Organizations: Not on file  . Attends Archivist Meetings: Not on file  . Marital Status: Not on file  Intimate Partner Violence:   . Fear of Current or Ex-Partner: Not on file  . Emotionally Abused: Not on file  . Physically Abused: Not on file  . Sexually Abused: Not on file   Family History  Problem Relation Age of Onset  . Hypertension Mother   . Osteoporosis Mother   . Cervical cancer Mother        Hysterectomy in 19s.  . Stroke Mother   . Crohn's disease Mother   . Irritable bowel syndrome Mother   . Sickle cell trait Father   . Sickle cell trait Sister   . Heart disease Maternal Aunt   . Breast cancer Maternal Aunt   . Heart disease Maternal Uncle   . Hypertension Maternal Uncle   . Ovarian cancer Paternal Aunt 14  . Heart disease Maternal Grandmother   . Hypertension Maternal Grandmother   . Osteoporosis Maternal Grandmother   . Rheum arthritis Maternal Grandfather   . Hypertension Maternal Grandfather   . Prostate cancer Maternal Grandfather   . Leukemia Cousin   . Colon cancer Neg Hx     . Esophageal cancer Neg Hx   . Pancreatic cancer Neg Hx   . Stomach cancer Neg Hx   . Inflammatory bowel disease Neg Hx   . Liver disease Neg Hx   . Rectal cancer Neg Hx    I have reviewed her medical, social, and family history in detail and updated the electronic medical record as necessary.    PHYSICAL EXAMINATION  BP 116/64   Pulse 84  Ht 5' 2"  (1.575 m)   Wt 104 lb 9.6 oz (47.4 kg)   BMI 19.13 kg/m  Wt Readings from Last 3 Encounters:  07/25/20 104 lb 9.6 oz (47.4 kg)  05/30/20 103 lb (46.7 kg)  05/03/20 106 lb 6.4 oz (48.3 kg)  GEN: NAD, appears stated age, doesn't appear chronically ill PSYCH: Cooperative, without pressured speech EYE: Conjunctivae pink, sclerae anicteric ENT: MMM CV: Nontachycardic RESP: No audible wheezing GI: NABS, soft, NT/ND, without rebound or guarding MSK/EXT: No lower extremity edema SKIN: No jaundice NEURO:  Alert & Oriented x 3, no focal deficits   REVIEW OF DATA  I reviewed the following data at the time of this encounter:  GI Procedures and Studies  COLONOSCOPY. 05/09/20 Findings: The perianal and digital rectal examinations were  normal. A 25 mm polyp with central ulceration was found in the  rectum. The polyp was sessile. Preparations were made  for mucosal resection. Thermal marking was done to  mark the borders of the lesion. Demarcation of the  lesion was performed to clearly identify boundaries of  the lesion. Orise gel was injected to raise the  lesion. ESD knife and snare mucosal resection was  performed. Resection and retrieval were complete. Estimated Blood Loss: Estimated blood loss: none. Impression: - One 25 mm polyp in the rectum, removed with mucosal  resection. Resected and retrieved. - Mucosal resection was performed. Resection and  retrieval were complete.  Pathology DIAGNOSIS    A.  Rectal polyp, endoscopic mucosal resection (EMR):  RECTAL POLYP WITH REACTIVE EPITHELIAL CHANGES, PATCHY MILD CHRONIC  INFLAMMATION, AND SMOOTH MUSCLE STRANDING COMPATIBLE WITH AN INFLAMMATORY PSEUDOPOLYP WITH PROLAPSE CHANGES. NEGATIVE FOR DYSPLASIA   FLEXIBLE SIGMOIDOSCOPY, 05/17/20 Findings:  The perianal exam findings include non-thrombosed external hemorrhoids. The digital rectal exam was normal. A single (solitary) 50 mm ulcer was found in the distal rectum. Stigmata of recent bleeding were present (two visible vessels). For hemostasis, three hemostatic clips were successfully placed across the visible vessels (MR conditional). Oozing was present at two sites within the ulcer. This was treated with snare tip cautery. Bleeding had stopped at the end of the maneuver.  FLEXIBLE SIGMOIDOSCOPY, 05/19/20 Findings: The digital rectal exam was abnormal. A single (solitary) 40 mm ulcer was found in the rectum. Multiple small vessels were seen within the resection bed. Several previously placed clips were seen. A large clot was present. Oozing was present. Stigmata of recent bleeding were present. To prevent further bleeding, eleven hemostatic clips were successfully placed (MR conditional) at the site of vessels. Hemostatic spray was deployed. Two sprays were applied. There was no bleeding at the end of the procedure.  Laboratory Studies  Reviewed in EPIC and care everywhere  Imaging Studies  No relevant studies to review   ASSESSMENT  Ms. Enerson is a 49 y.o. 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  The patient is seen today for evaluation and management of:  1. Cap polyposis ([place])   2. Rectal polyp   3. Rectal bleeding   4. Unintentional weight loss   5. Constipation, unspecified constipation type   6. History of anemia   7. Abnormal colonoscopy    The patient is hemodynamically stable.  Her ESD did confirm the inflammatory  pseudopolyp/Polyp.  Unfortunately, she did have some significant postprocedural bleeding requiring 2 interventions.  Thankfully she has not had the severity of  bleeding that she has had preprocedure.  She is still having some episodes of rectal bleeding and urgency.  Her constipation persists.  The patient experience at [facility] in regards to her follow-up has not been felt to be appropriate by the patient's needs.  She had been told that her follow-up flexible sigmoidoscopy is also been canceled by the previous provider for unclear reasons.  At this point, she is not sure that she wants to follow-up with [facility].  I am happy to reevaluate the region to see why she continues to have episodes of rectal bleeding.  Hopefully this is just hemorrhoidal bleeding but if it looks like she is having inflammatory changes or Polyposis is returning then surgical intervention will need to be entertained.  She never ended up seeing colorectal surgery because she just underwent ESD for the procedure and did not need to end up seeing colorectal surgery except in the emergency setting if she did not stop bleeding.  As such we will plan to see how her flexible sigmoidoscopy looks and then decide from there as to next steps and interventions and evaluation that may be required.  She is describing symptoms of pica and we need to obtain labs and iron indices to better define if she has a persistent anemia or iron deficiency or both.  The patient is also experiencing continued unintentional weight loss.  We will see what the flexible sigmoidoscopy shows but if the unintentional weight loss persists then likely endoscopic evaluation from above will be required.  Also she has iron deficiency we will need to do an upper endoscopy.  She may need a CT scan of the chest as well.  I do not believe that the patient has underlying malabsorption because I would be expecting her to have significant diarrhea and as such other causes for unintentional  weight loss will need to be explored potentially by her primary care provider if we do not see anything else in our work-up or if the weight loss continues.   PLAN  Laboratories as outlined below Proceed with scheduling flexible sigmoidoscopy to reevaluate site of recent ESD Potential upper endoscopy if evidence of iron deficiency is found May need to consider a CT of the chest to further evaluate unintentional weight loss if it persists   Orders Placed This Encounter  Procedures  . Procedural/ Surgical Case Request: FLEXIBLE SIGMOIDOSCOPY  . CBC  . Comp Met (CMET)  . TSH  . Cortisol  . IBC + Ferritin  . IgA  . Tissue transglutaminase, IgA  . Ambulatory referral to Gastroenterology    New Prescriptions   No medications on file   Modified Medications   No medications on file    Planned Follow Up No follow-ups on file.   Total Time in Face-to-Face and in Coordination of Care for patient including independent/personal interpretation/review of prior testing, medical history, examination, medication adjustment, communicating results with the patient directly, and documentation with the EHR is 30 minutes.    Justice Britain, MD [facility] Advanced [facility]oscopy Office # [phone]

## 2020-07-25 NOTE — Patient Instructions (Signed)
Your provider has requested that you go to the basement level for lab work before leaving today. Press "B" on the elevator. The lab is located at the first door on the left as you exit the elevator.  You have been scheduled for an endoscopy. Please follow written instructions given to you at your visit today. If you use inhalers (even only as needed), please bring them with you on the day of your procedure.  Due to recent changes in healthcare laws, you may see the results of your imaging and laboratory studies on MyChart before your provider has had a chance to review them.  We understand that in some cases there may be results that are confusing or concerning to you. Not all laboratory results come back in the same time frame and the provider may be waiting for multiple results in order to interpret others.  Please give [place] 48 hours in order for your provider to thoroughly review all the results before contacting the office for clarification of your results.   If you are age 65 or older, your body mass index should be between 23-30. Your Body mass index is 19.13 kg/m. If this is out of the aforementioned range listed, please consider follow up with your Primary Care Provider.  If you are age 35 or younger, your body mass index should be between 19-25. Your Body mass index is 19.13 kg/m. If this is out of the aformentioned range listed, please consider follow up with your Primary Care Provider.    Thank you for choosing me and [facility].  Dr. Rush Landmark

## 2020-07-25 NOTE — H&P (View-Only) (Signed)
[place] VISIT   Primary Care Provider Delsa Grana, [name] [address] [place] [address] [phone]  Referring Provider Dr. Bonna Gains & Dr. Dema Severin  Patient Profile: Angela Gordon is a 49 y.o. 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  The patient presents to the [facility] for an evaluation and management of problem(s) noted below:  Problem List 1. Cap polyposis ([place])   2. Rectal polyp   3. Rectal bleeding   4. Unintentional weight loss   5. Constipation, unspecified constipation type   6. History of anemia   7. Abnormal colonoscopy     History of Present Illness Please see initial consultation note for full details of HPI.  Interval History Today the patient is presenting for follow-up.  Her history has been reviewed.  After my last attempt at potential resection with which my endoscopic ultrasound suggested potentially submucosal involvement, did not feel a piecemeal resection was adequate for this patient.  ESD referral was placed.  My former colleague, Dr. Leroy Sea, met the patient and proceeded with a resection in June of this year.  A nearly 5-hour ESD was performed.  Pathology confirmed a rectal polyp with reactive epithelial changes and chronic inflammation and smooth muscle stranding compatible with an inflammatory Pseudopolyp with prolapse.  Patient was able to be discharged.  She did well for a few days.  However she began to have rectal bleeding.  She ended up returning to [facility] and underwent observation admission with evaluation finding need for further cauterization and Endo Clip placement.  Unfortunately she had continued bleeding and was transferred to the ICU and subsequently required reintervention 2 days later with significant hemoclips  and hemostatic spray being performed.  She had clinical stability and was able to be discharged.  The patient was planned a repeat evaluation approximately 3 months after her procedure however she describes being told that she is not going to have that procedure done at [facility] due to certain issues.  Also there seems to have been some communication issues concerns in regards to the follow-up plan from [facility] perspective for the patient in regards to how significant bleeding she had.  As such she is seeking follow-up here.  The patient has done relatively well.  The amount of bleeding and spotting that she had previously has significantly improved.  However, she is still having some episodes of rectal bleeding and urgency.  Should continues to have issues of constipation.  The patient has also noted some increasing pica symptoms and it would course the last few weeks is eating ice significantly.  She wonders if she could have iron deficiency.  The patient has had a continued weight loss unintentionally with an unclear etiology.  They have been concerned as to whether she was having malabsorption however she is not having diarrhea so in theory cannot be having malabsorption.  She is eating what she wants when she wants but the weight continues to decrease.  GI Review of Systems Positive as above Negative for odynophagia, dysphagia, pyrosis, abdominal pain, melena  Review of Systems General: Denies fevers/chills HEENT: Denies oral lesions Cardiovascular: Denies chest pain Pulmonary: Denies shortness of breath Gastroenterological: See HPI Genitourinary: Denies darkened urine or hematuria Hematological: History previously for easy bruising/bleeding Dermatological: Denies jaundice Psychological: Mood is less anxious than previous but obviously still having concerned in the setting  of having persistent rectal bleeding   Medications Current Outpatient Medications  Medication Sig Dispense Refill    albuterol (PROVENTIL) (5 MG/ML) 0.5% nebulizer solution Take 2.5 mg by nebulization as needed for wheezing or shortness of breath.      albuterol (VENTOLIN HFA) 108 (90 Base) MCG/ACT inhaler Inhale 2 puffs into the lungs every 6 (six) hours as needed for wheezing or shortness of breath.     alendronate-cholecalciferol (FOSAMAX PLUS D) 70-2800 MG-UNIT tablet Take 1 tablet by mouth every 7 (seven) days. Take with a full glass of water on an empty stomach.  Wednesday     Cholecalciferol (VITAMIN D3) 125 MCG (5000 UT) TABS Take 5,000 Units by mouth once a week.     metroNIDAZOLE (METROGEL VAGINAL) 0.75 % vaginal gel Place 1 Applicatorful vaginally 2 (two) times daily. 70 g 0   polyethylene glycol powder (GLYCOLAX/MIRALAX) 17 GM/SCOOP powder Take 17 g by mouth daily as needed (constipation). 3350 g 1   Current Facility-Administered Medications  Medication Dose Route Frequency Provider Last Rate Last Admin   cyanocobalamin ((VITAMIN B-12)) injection 1,000 mcg  1,000 mcg Intramuscular Q30 days Steele Sizer, MD   1,000 mcg at 06/25/20 0901    Allergies Allergies  Allergen Reactions   Erythromycin Base Other (See Comments)   Penicillins Diarrhea and Rash    Did it involve swelling of the face/tongue/throat, SOB, or low BP? Unknown Did it involve sudden or severe rash/hives, skin peeling, or any reaction on the inside of your mouth or nose? Unknown Did you need to seek medical attention at a hospital or doctor's office? Unknown When did it last happen?Childhood If all above answers are NO, may proceed with cephalosporin use.   Scallops [Shellfish Allergy] Anaphylaxis   Erythromycin     Histories Past Medical History:  Diagnosis Date   Asthma    Uses rescue inhaler occasionally.   Complication of anesthesia    slow to wake up   Headache    Heart murmur    was told she had MVP when she was around 24 years, she states she never followed up with that and has never had  any issues from it   History of blood transfusion 1992   Blood Loss from Toxemia of Pregnancy   Hypoglycemia    Osteoporosis    Strong FHx of Osteoporosis.   PONV (postoperative nausea and vomiting)    Scoliosis    Seen Dr. Jefm Bryant   Sickle-cell trait [facility])    Stroke [facility]) 1992   Toxemia of Pregnancy    Past Surgical History:  Procedure Laterality Date   ABDOMINAL HYSTERECTOMY  2010   total -Menopausal symptoms, takes HRT   BIOPSY  02/26/2020   Procedure: BIOPSY;  Surgeon: Irving Copas., MD;  Location: [facility] ENDOSCOPY;  Service: Gastroenterology;;   BREAST EXCISIONAL BIOPSY Left 2003   neg bx dr Jamal Collin   BREAST SURGERY Left 2004   biopsy, benign   COLONOSCOPY WITH PROPOFOL N/A 11/29/2019   Procedure: COLONOSCOPY WITH PROPOFOL;  Surgeon: Virgel Manifold, MD;  Location: [facility] ENDOSCOPY;  Service: Endoscopy;  Laterality: N/A;   COLONOSCOPY WITH PROPOFOL N/A 02/26/2020   Procedure: COLONOSCOPY WITH PROPOFOL;  Surgeon: Rush Landmark Telford Nab., MD;  Location: [place];  Service: Gastroenterology;  Laterality: N/A;   CYST REMOVAL HAND Right 2005-06   EUS  02/26/2020   Procedure: LOWER ENDOSCOPIC ULTRASOUND (EUS);  Surgeon: Irving Copas., MD;  Location: [place];  Service: Gastroenterology;;   HERNIA REPAIR  2004  umbilical   TONSILLECTOMY  1978   Social History   Socioeconomic History   Marital status: Married    Spouse name: Not on file   Number of children: Not on file   Years of education: Not on file   Highest education level: Not on file  Occupational History   Not on file  Tobacco Use   Smoking status: Never Smoker   Smokeless tobacco: Never Used  Vaping Use   Vaping Use: Never used  Substance and Sexual Activity   Alcohol use: Yes    Alcohol/week: 1.0 standard drink    Types: 1 Glasses of wine per week    Comment: 1 glass of wine every other day   Drug use: No   Sexual activity: Yes    Birth  control/protection: Surgical  Other Topics Concern   Not on file  Social History Narrative   Not on file   Social Determinants of Health   Financial Resource Strain:    Difficulty of Paying Living Expenses: Not on file  Food Insecurity:    Worried About Charity fundraiser in the Last Year: Not on file   [employer] of Food in the Last Year: Not on file  Transportation Needs:    Lack of Transportation (Medical): Not on file   Lack of Transportation (Non-Medical): Not on file  Physical Activity:    Days of Exercise per Week: Not on file   Minutes of Exercise per Session: Not on file  Stress:    Feeling of Stress : Not on file  Social Connections:    Frequency of Communication with Friends and Family: Not on file   Frequency of Social Gatherings with Friends and Family: Not on file   Attends Religious Services: Not on file   Active Member of Clubs or Organizations: Not on file   Attends Archivist Meetings: Not on file   Marital Status: Not on file  Intimate Partner Violence:    Fear of Current or Ex-Partner: Not on file   Emotionally Abused: Not on file   Physically Abused: Not on file   Sexually Abused: Not on file   Family History  Problem Relation Age of Onset   Hypertension Mother    Osteoporosis Mother    Cervical cancer Mother        Hysterectomy in 3s.   Stroke Mother    Crohn's disease Mother    Irritable bowel syndrome Mother    Sickle cell trait Father    Sickle cell trait Sister    Heart disease Maternal Aunt    Breast cancer Maternal Aunt    Heart disease Maternal Uncle    Hypertension Maternal Uncle    Ovarian cancer Paternal Aunt 64   Heart disease Maternal Grandmother    Hypertension Maternal Grandmother    Osteoporosis Maternal Grandmother    Rheum arthritis Maternal Grandfather    Hypertension Maternal Grandfather    Prostate cancer Maternal Grandfather    Leukemia Cousin    Colon cancer Neg Hx      Esophageal cancer Neg Hx    Pancreatic cancer Neg Hx    Stomach cancer Neg Hx    Inflammatory bowel disease Neg Hx    Liver disease Neg Hx    Rectal cancer Neg Hx    I have reviewed her medical, social, and family history in detail and updated the electronic medical record as necessary.    PHYSICAL EXAMINATION  BP 116/64    Pulse 84  Ht 5' 2"  (1.575 m)    Wt 104 lb 9.6 oz (47.4 kg)    BMI 19.13 kg/m  Wt Readings from Last 3 Encounters:  07/25/20 104 lb 9.6 oz (47.4 kg)  05/30/20 103 lb (46.7 kg)  05/03/20 106 lb 6.4 oz (48.3 kg)  GEN: NAD, appears stated age, doesn't appear chronically ill PSYCH: Cooperative, without pressured speech EYE: Conjunctivae pink, sclerae anicteric ENT: MMM CV: Nontachycardic RESP: No audible wheezing GI: NABS, soft, NT/ND, without rebound or guarding MSK/EXT: No lower extremity edema SKIN: No jaundice NEURO:  Alert & Oriented x 3, no focal deficits   REVIEW OF DATA  I reviewed the following data at the time of this encounter:  GI Procedures and Studies  COLONOSCOPY. 05/09/20 Findings: The perianal and digital rectal examinations were  normal. A 25 mm polyp with central ulceration was found in the  rectum. The polyp was sessile. Preparations were made  for mucosal resection. Thermal marking was done to  mark the borders of the lesion. Demarcation of the  lesion was performed to clearly identify boundaries of  the lesion. Orise gel was injected to raise the  lesion. ESD knife and snare mucosal resection was  performed. Resection and retrieval were complete. Estimated Blood Loss: Estimated blood loss: none. Impression: - One 25 mm polyp in the rectum, removed with mucosal  resection. Resected and retrieved. - Mucosal resection was performed. Resection and  retrieval were complete.  Pathology DIAGNOSIS    A.  Rectal polyp, endoscopic mucosal resection (EMR):  RECTAL POLYP WITH REACTIVE EPITHELIAL CHANGES, PATCHY MILD CHRONIC  INFLAMMATION, AND SMOOTH MUSCLE STRANDING COMPATIBLE WITH AN INFLAMMATORY PSEUDOPOLYP WITH PROLAPSE CHANGES. NEGATIVE FOR DYSPLASIA   FLEXIBLE SIGMOIDOSCOPY, 05/17/20 Findings:  The perianal exam findings include non-thrombosed external hemorrhoids. The digital rectal exam was normal. A single (solitary) 50 mm ulcer was found in the distal rectum. Stigmata of recent bleeding were present (two visible vessels). For hemostasis, three hemostatic clips were successfully placed across the visible vessels (MR conditional). Oozing was present at two sites within the ulcer. This was treated with snare tip cautery. Bleeding had stopped at the end of the maneuver.  FLEXIBLE SIGMOIDOSCOPY, 05/19/20 Findings: The digital rectal exam was abnormal. A single (solitary) 40 mm ulcer was found in the rectum. Multiple small vessels were seen within the resection bed. Several previously placed clips were seen. A large clot was present. Oozing was present. Stigmata of recent bleeding were present. To prevent further bleeding, eleven hemostatic clips were successfully placed (MR conditional) at the site of vessels. Hemostatic spray was deployed. Two sprays were applied. There was no bleeding at the end of the procedure.  Laboratory Studies  Reviewed in EPIC and care everywhere  Imaging Studies  No relevant studies to review   ASSESSMENT  Ms. Dobias is a 49 y.o. 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  The patient is seen today for evaluation and management of:  1. Cap polyposis ([place])   2. Rectal polyp   3. Rectal bleeding   4. Unintentional weight loss   5. Constipation, unspecified constipation type   6. History of anemia   7. Abnormal colonoscopy    The patient is hemodynamically stable.  Her ESD did confirm the inflammatory  pseudopolyp/Polyp.  Unfortunately, she did have some significant postprocedural bleeding requiring 2 interventions.  Thankfully she has not had the  severity of bleeding that she has had preprocedure.  She is still having some episodes of rectal bleeding and urgency.  Her constipation persists.  The patient experience at [facility] in regards to her follow-up has not been felt to be appropriate by the patient's needs.  She had been told that her follow-up flexible sigmoidoscopy is also been canceled by the previous provider for unclear reasons.  At this point, she is not sure that she wants to follow-up with [facility].  I am happy to reevaluate the region to see why she continues to have episodes of rectal bleeding.  Hopefully this is just hemorrhoidal bleeding but if it looks like she is having inflammatory changes or Polyposis is returning then surgical intervention will need to be entertained.  She never ended up seeing colorectal surgery because she just underwent ESD for the procedure and did not need to end up seeing colorectal surgery except in the emergency setting if she did not stop bleeding.  As such we will plan to see how her flexible sigmoidoscopy looks and then decide from there as to next steps and interventions and evaluation that may be required.  She is describing symptoms of pica and we need to obtain labs and iron indices to better define if she has a persistent anemia or iron deficiency or both.  The patient is also experiencing continued unintentional weight loss.  We will see what the flexible sigmoidoscopy shows but if the unintentional weight loss persists then likely endoscopic evaluation from above will be required.  Also she has iron deficiency we will need to do an upper endoscopy.  She may need a CT scan of the chest as well.  I do not believe that the patient has underlying malabsorption because I would be expecting her to have significant diarrhea and as such other causes for unintentional  weight loss will need to be explored potentially by her primary care provider if we do not see anything else in our work-up or if the weight loss continues.   PLAN  Laboratories as outlined below Proceed with scheduling flexible sigmoidoscopy to reevaluate site of recent ESD Potential upper endoscopy if evidence of iron deficiency is found May need to consider a CT of the chest to further evaluate unintentional weight loss if it persists   Orders Placed This Encounter  Procedures   Procedural/ Surgical Case Request: FLEXIBLE SIGMOIDOSCOPY   CBC   Comp Met (CMET)   TSH   Cortisol   IBC + Ferritin   IgA   Tissue transglutaminase, IgA   Ambulatory referral to Gastroenterology    New Prescriptions   No medications on file   Modified Medications   No medications on file    Planned Follow Up No follow-ups on file.   Total Time in Face-to-Face and in Coordination of Care for patient including independent/personal interpretation/review of prior testing, medical history, examination, medication adjustment, communicating results with the patient directly, and documentation with the EHR is 30 minutes.    Justice Britain, MD [facility] Office # [phone]

## 2020-07-26 ENCOUNTER — Telehealth: Payer: Self-pay | Admitting: Gastroenterology

## 2020-07-26 LAB — TISSUE TRANSGLUTAMINASE, IGA: (tTG) Ab, IgA: 1 U/mL

## 2020-07-27 ENCOUNTER — Encounter: Payer: Self-pay | Admitting: Gastroenterology

## 2020-07-27 DIAGNOSIS — K5900 Constipation, unspecified: Secondary | ICD-10-CM | POA: Insufficient documentation

## 2020-07-27 DIAGNOSIS — Z862 Personal history of diseases of the blood and blood-forming organs and certain disorders involving the immune mechanism: Secondary | ICD-10-CM | POA: Insufficient documentation

## 2020-07-27 DIAGNOSIS — K5140 Inflammatory polyps of colon without complications: Secondary | ICD-10-CM | POA: Insufficient documentation

## 2020-07-27 DIAGNOSIS — R634 Abnormal weight loss: Secondary | ICD-10-CM | POA: Insufficient documentation

## 2020-07-27 DIAGNOSIS — R933 Abnormal findings on diagnostic imaging of other parts of digestive tract: Secondary | ICD-10-CM | POA: Insufficient documentation

## 2020-07-29 ENCOUNTER — Telehealth: Payer: Self-pay

## 2020-07-29 DIAGNOSIS — R634 Abnormal weight loss: Secondary | ICD-10-CM

## 2020-07-29 DIAGNOSIS — Z862 Personal history of diseases of the blood and blood-forming organs and certain disorders involving the immune mechanism: Secondary | ICD-10-CM

## 2020-07-29 DIAGNOSIS — K625 Hemorrhage of anus and rectum: Secondary | ICD-10-CM

## 2020-07-29 DIAGNOSIS — K5900 Constipation, unspecified: Secondary | ICD-10-CM

## 2020-07-29 DIAGNOSIS — E611 Iron deficiency: Secondary | ICD-10-CM

## 2020-07-29 MED ORDER — FERROUS GLUCONATE 324 (38 FE) MG PO TABS: 324.0000 mg | ORAL_TABLET | Freq: Every day | ORAL | 3 refills | Status: DC

## 2020-07-29 NOTE — Telephone Encounter (Signed)
See result note.  

## 2020-07-29 NOTE — Telephone Encounter (Signed)
Rx for ferrous gluconate sent to pharmacy. EGD added to Flex sig on 08/19/20 @ [place]. Pt has been informed. Pt will come in for repeat labs on 08/15/20. Lab order entered in Epic.

## 2020-08-06 ENCOUNTER — Telehealth: Payer: Self-pay

## 2020-08-06 NOTE — Telephone Encounter (Signed)
-----   Message from Irving Copas., MD sent at 08/01/2020 10:18 AM EDT ----- Regarding: RE: Return to work I am happy to help with it until my procedure.  As I told her at clinic visit this is really to just look and see what things are looking like and unlikely to be doing any therapy.  I would let her know that I do not know if any further FMLA will be necessary thereafter.  If anything else will be required for other therapies or other referrals we will have to see but I believe she should be able to get back to work within a few days of our procedure. Thank you.  GM ----- Message ----- From: Debbe Mounts, CMA Sent: 07/30/2020   4:50 PM EDT To: Irving Copas., MD Subject: Return to work                                 Dr Rush Landmark,   I forgot to ask you, but when I spoke with Mrs. Owens Shark on yesterday she asked about returning to work. Her current FMLA or STD has her returning to work on 08/07/20, I believe she said. She was wondering if she should wait and return after upcoming procedures. I told her I would ask you and see what your thoughts were and get back with her.She is aware that you are away from the office this week.  Please advise.   Sehaj Kolden

## 2020-08-06 NOTE — Telephone Encounter (Signed)
Pt informed. Pt states that she will come by office on 08/15/20 and have labs due before covid testing.

## 2020-08-13 ENCOUNTER — Ambulatory Visit (INDEPENDENT_AMBULATORY_CARE_PROVIDER_SITE_OTHER): Payer: BC Managed Care – PPO | Admitting: Family Medicine

## 2020-08-13 ENCOUNTER — Ambulatory Visit
Admission: RE | Admit: 2020-08-13 | Discharge: 2020-08-13 | Disposition: A | Discharge status: Home / Self Care | Payer: BC Managed Care – PPO | Attending: Family Medicine | Admitting: Family Medicine

## 2020-08-13 ENCOUNTER — Other Ambulatory Visit: Payer: Self-pay

## 2020-08-13 ENCOUNTER — Ambulatory Visit
Admission: RE | Admit: 2020-08-13 | Discharge: 2020-08-13 | Disposition: A | Discharge status: Home / Self Care | Payer: BC Managed Care – PPO | Source: Ambulatory Visit | Attending: Family Medicine | Admitting: Family Medicine

## 2020-08-13 ENCOUNTER — Encounter: Payer: Self-pay | Admitting: Family Medicine

## 2020-08-13 VITALS — BP 110/60 | HR 83 | Temp 98.1°F | Resp 18 | Ht 62.0 in | Wt 107.7 lb

## 2020-08-13 DIAGNOSIS — M25551 Pain in right hip: Secondary | ICD-10-CM | POA: Insufficient documentation

## 2020-08-13 NOTE — Patient Instructions (Signed)
Hip Pain The hip is the joint between the upper legs and the lower pelvis. The bones, cartilage, tendons, and muscles of your hip joint support your body and allow you to move around. Hip pain can range from a minor ache to severe pain in one or both of your hips. The pain may be felt on the inside of the hip joint near the groin, or on the outside near the buttocks and upper thigh. You may also have swelling or stiffness in your hip area. Follow these instructions at home: Managing pain, stiffness, and swelling      If directed, put ice on the painful area. To do this: ? Put ice in a plastic bag. ? Place a towel between your skin and the bag. ? Leave the ice on for 20 minutes, 2-3 times a day.  If directed, apply heat to the affected area as often as told by your health care provider. Use the heat source that your health care provider recommends, such as a moist heat pack or a heating pad. ? Place a towel between your skin and the heat source. ? Leave the heat on for 20-30 minutes. ? Remove the heat if your skin turns bright red. This is especially important if you are unable to feel pain, heat, or cold. You may have a greater risk of getting burned. Activity  Do exercises as told by your health care provider.  Avoid activities that cause pain. General instructions   Take over-the-counter and prescription medicines only as told by your health care provider.  Keep a journal of your symptoms. Write down: ? How often you have hip pain. ? The location of your pain. ? What the pain feels like. ? What makes the pain worse.  Sleep with a pillow between your legs on your most comfortable side.  Keep all follow-up visits as told by your health care provider. This is important. Contact a health care provider if:  You cannot put weight on your leg.  Your pain or swelling continues or gets worse after one week.  It gets harder to walk.  You have a fever. Get help right away  if:  You fall.  You have a sudden increase in pain and swelling in your hip.  Your hip is red or swollen or very tender to touch. Summary  Hip pain can range from a minor ache to severe pain in one or both of your hips.  The pain may be felt on the inside of the hip joint near the groin, or on the outside near the buttocks and upper thigh.  Avoid activities that cause pain.  Write down how often you have hip pain, the location of the pain, what makes it worse, and what it feels like. This information is not intended to replace advice given to you by your health care provider. Make sure you discuss any questions you have with your health care provider. Document Revised: 04/03/2019 Document Reviewed: 04/03/2019 Elsevier Patient Education  2020 [employer] -- 

## 2020-08-13 NOTE — Progress Notes (Signed)
Patient ID: Angela Gordon, female    DOB: [birth year], 49 y.o.   MRN: [id number]  PCP: Delsa Grana, [name]  Chief Complaint  Patient presents with  . Muscle Pain    right leg groin    Subjective:   Angela Gordon is a 49 y.o. female, presents to clinic with CC of the following:  HPI  Left groin/hip pain onset gradually yesterday, no strenuous activity or injury, but she did walk a little bit and walk some stairs.  Today she has been limping and as the day has gone on she has worsening pain with weight bearing.  Pain moderate to severe, no alleviating factors.      Patient Active Problem List   Diagnosis Date Noted  . Cap polyposis ([place]) 07/27/2020  . Abnormal colonoscopy 07/27/2020  . History of anemia 07/27/2020  . Constipation 07/27/2020  . Unintentional weight loss 07/27/2020  . Intestinal malabsorption 05/31/2020  . Hypocalcemia 05/31/2020  . Anemia 05/31/2020  . B12 deficiency 05/31/2020  . Protein-calorie malnutrition ([place]) 05/31/2020  . IBD (inflammatory bowel disease) 05/31/2020  . Rectal polyp 01/11/2020  . Rectal bleeding 01/11/2020  . Encounter for screening colonoscopy   . Rectal mass   . Osteoporosis 10/06/2019  . Family history of ovarian cancer 07/26/2017  . Vitamin D deficiency 05/07/2016  . Sickle-cell trait ([place]) 05/07/2016  . 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  acetaminophen (TYLENOL) 325 MG tablet, Take 325-650 mg by mouth every 6 (six) hours as needed for moderate pain., Disp: , Rfl:  .  albuterol (VENTOLIN HFA) 108 (90 Base) MCG/ACT inhaler, Inhale 2 puffs into the lungs every 6 (six) hours as needed for wheezing or shortness of breath., Disp: , Rfl:  .  alendronate-cholecalciferol (FOSAMAX PLUS D) 70-2800 MG-UNIT tablet, Take 1 tablet by mouth every Wednesday. Take with a full glass of water on an empty stomach., Disp: , Rfl:  .  calcium carbonate (OSCAL) 1500 (600 Ca) MG TABS tablet, Take  600 mg of elemental calcium by mouth 2 (two) times daily., Disp: , Rfl:  .  Cholecalciferol (VITAMIN D3) 125 MCG (5000 UT) TABS, Take 5,000 Units by mouth every Monday. , Disp: , Rfl:  .  Cyanocobalamin (B-12 COMPLIANCE INJECTION) 1000 MCG/ML KIT, Inject 1 Dose as directed every 30 (thirty) days., Disp: , Rfl:  .  diphenhydrAMINE (BENADRYL) 25 MG tablet, Take 25 mg by mouth daily as needed for allergies., Disp: , Rfl:  .  ferrous gluconate (FERGON) 324 MG tablet, Take 1 tablet (324 mg total) by mouth daily with breakfast., Disp: 30 tablet, Rfl: 3 .  metroNIDAZOLE (METROGEL VAGINAL) 0.75 % vaginal gel, Place 1 Applicatorful vaginally 2 (two) times daily. (Patient taking differently: Place 1 Applicatorful vaginally daily as needed (irritation). ), Disp: 70 g, Rfl: 0 .  naproxen (NAPROSYN) 500 MG tablet, Take 500 mg by mouth 2 (two) times daily as needed for moderate pain., Disp: , Rfl:  .  polyethylene glycol powder (GLYCOLAX/MIRALAX) 17 GM/SCOOP powder, Take 17 g by mouth daily as needed (constipation). (Patient taking differently: Take 17 g by mouth daily as needed for moderate constipation. ), Disp: 3350 g, Rfl: 1 .  thiamine 100 MG tablet, Take 100 mg by mouth daily., Disp: , Rfl:  .  tiZANidine (ZANAFLEX) 4 MG tablet, Take 4 mg by mouth every 8 (eight) hours as needed for muscle spasms., Disp: , Rfl:   Current Facility-Administered Medications:  .  cyanocobalamin ((VITAMIN  B-12)) injection 1,000 mcg, 1,000 mcg, Intramuscular, Q30 days, Steele Sizer, MD, 1,000 mcg at 06/25/20 0901   Allergies  Allergen Reactions  . Erythromycin Base Other (See Comments)    Several family members have severe side effects, pt has never taken but should avoid   . Penicillins Diarrhea and Rash    Did it involve swelling of the face/tongue/throat, SOB, or low BP? Unknown Did it involve sudden or severe rash/hives, skin peeling, or any reaction on the inside of your mouth or nose? Unknown Did you need to seek  medical attention at a hospital or doctor's office? Unknown When did it last happen?Childhood If all above answers are "NO", may proceed with cephalosporin use.  Elyse Hsu [Shellfish Allergy] Anaphylaxis     Social History   Tobacco Use  . Smoking status: Never Smoker  . Smokeless tobacco: Never Used  Vaping Use  . Vaping Use: Never used  Substance Use Topics  . Alcohol use: Yes    Alcohol/week: 1.0 standard drink    Types: 1 Glasses of wine per week    Comment: 1 glass of wine every other day  . Drug use: No      Chart Review Today: I personally reviewed active problem list, medication list, allergies, family history, social history, health maintenance, notes from last encounter, lab results, imaging with the patient/caregiver today.   Review of Systems 10 Systems reviewed and are negative for acute change except as noted in the HPI.     Objective:   Vitals:   08/13/20 1419  BP: 110/60  Pulse: 83  Resp: 18  Temp: 98.1 F (36.7 C)  TempSrc: Oral  SpO2: 99%  Weight: 107 lb 11.2 oz (48.9 kg)  Height: $Remove'5\' 2"'FfPjZuh$  (1.575 m)    Body mass index is 19.7 kg/m.  Physical Exam Vitals and nursing note reviewed.  Eyes:     General:        Right eye: No discharge.     Conjunctiva/sclera: Conjunctivae normal.  Musculoskeletal:        General: No swelling.     Right hip: No deformity, lacerations, tenderness, bony tenderness or crepitus. Decreased range of motion. Normal strength.     Comments: Right hip decreased internal rotation, normal external rotation No ttp over greater trochanter or ASIS or AIIS  Neurological:     Mental Status: She is alert.     Gait: Gait abnormal.  Psychiatric:        Mood and Affect: Mood normal.        Behavior: Behavior normal.      Results for orders placed or performed in visit on 07/25/20  CBC  Result Value Ref Range   WBC 6.5 4.0 - 10.5 K/uL   RBC 4.36 3.87 - 5.11 Mil/uL   Platelets 232.0 150 - 400 K/uL   Hemoglobin 12.6  12.0 - 15.0 g/dL   HCT 38.2 36 - 46 %   MCV 87.7 78.0 - 100.0 fl   MCHC 33.1 30.0 - 36.0 g/dL   RDW 14.0 11.5 - 15.5 %  Comp Met (CMET)  Result Value Ref Range   Sodium 139 135 - 145 mEq/L   Potassium 4.2 3.5 - 5.1 mEq/L   Chloride 105 96 - 112 mEq/L   CO2 27 19 - 32 mEq/L   Glucose, Bld 88 70 - 99 mg/dL   BUN 16 6 - 23 mg/dL   Creatinine, Ser 0.79 0.40 - 1.20 mg/dL   Total Bilirubin 0.8 0.2 -  1.2 mg/dL   Alkaline Phosphatase 50 39 - 117 U/L   AST 17 0 - 37 U/L   ALT 15 0 - 35 U/L   Total Protein 7.9 6.0 - 8.3 g/dL   Albumin 4.6 3.5 - 5.2 g/dL   GFR 93.85 >60.00 mL/min   Calcium 9.6 8.4 - 10.5 mg/dL  TSH  Result Value Ref Range   TSH 0.76 0.35 - 4.50 uIU/mL  Cortisol  Result Value Ref Range   Cortisol, Plasma 6.9 ug/dL  IBC + Ferritin  Result Value Ref Range   Iron 34 (L) 42 - 145 ug/dL   Transferrin 308.0 212.0 - 360.0 mg/dL   Saturation Ratios 7.9 (L) 20.0 - 50.0 %   Ferritin 5.3 (L) 10.0 - 291.0 ng/mL  IgA  Result Value Ref Range   IgA 301 68 - 378 mg/dL  Tissue transglutaminase, IgA  Result Value Ref Range   (tTG) Ab, IgA 1 U/mL       Assessment & Plan:   1. Right hip pain - DG Hip Unilat W OR W/O Pelvis 2-3 Views Right; Future With multiple recent procedures, weight loss, deficiencies and prior osteoporosis severe and advanced for her age - Xray to eval hip, doubt fx, but will help r/o, and assess joint space May be right hip OA flare with recent increased walking and going up stairs, possible strain? RICE tx, patient waiting, avoid strenuous activity  She has f/up with GI/surgical specialists on Monday - will likely need to avoid NSAIDS but she noted she may be able to take steroids after their f/up  - will check xray - possible steroid burst if this is through to be OA flare  Explained that strain or injury to tendon or ligament may take several weeks and even up to 4-6 weeks to improve. PT may be helpful if she manages conservatively and dose not feel  better in the next 3-4 weeks.      Delsa Grana, [name] 08/13/20 2:31 PM

## 2020-08-14 ENCOUNTER — Encounter: Payer: Self-pay | Admitting: Family Medicine

## 2020-08-15 ENCOUNTER — Other Ambulatory Visit (INDEPENDENT_AMBULATORY_CARE_PROVIDER_SITE_OTHER): Payer: BC Managed Care – PPO

## 2020-08-15 ENCOUNTER — Other Ambulatory Visit (HOSPITAL_COMMUNITY)
Admission: RE | Admit: 2020-08-15 | Discharge: 2020-08-15 | Disposition: A | Discharge status: Home / Self Care | Payer: BC Managed Care – PPO | Source: Ambulatory Visit | Attending: Gastroenterology | Admitting: Gastroenterology

## 2020-08-15 DIAGNOSIS — Z20822 Contact with and (suspected) exposure to covid-19: Secondary | ICD-10-CM | POA: Insufficient documentation

## 2020-08-15 DIAGNOSIS — Z01812 Encounter for preprocedural laboratory examination: Secondary | ICD-10-CM | POA: Diagnosis not present

## 2020-08-15 DIAGNOSIS — E611 Iron deficiency: Secondary | ICD-10-CM | POA: Diagnosis not present

## 2020-08-15 LAB — IBC + FERRITIN
Ferritin: 6.8 ng/mL — ABNORMAL LOW (ref 10.0–291.0)
Iron: 304 ug/dL — ABNORMAL HIGH (ref 42–145)
Saturation Ratios: 86.5 % — ABNORMAL HIGH (ref 20.0–50.0)
Transferrin: 251 mg/dL (ref 212.0–360.0)

## 2020-08-15 LAB — CBC
HCT: 37.1 % (ref 36.0–46.0)
Hemoglobin: 12 g/dL (ref 12.0–15.0)
MCHC: 32.5 g/dL (ref 30.0–36.0)
MCV: 88.1 fl (ref 78.0–100.0)
Platelets: 234 10*3/uL (ref 150.0–400.0)
RBC: 4.21 Mil/uL (ref 3.87–5.11)
RDW: 15.1 % (ref 11.5–15.5)
WBC: 5.8 10*3/uL (ref 4.0–10.5)

## 2020-08-15 LAB — SARS CORONAVIRUS 2 (TAT 6-24 HRS): SARS Coronavirus 2: NEGATIVE

## 2020-08-16 ENCOUNTER — Encounter (HOSPITAL_COMMUNITY): Payer: Self-pay | Admitting: Gastroenterology

## 2020-08-16 ENCOUNTER — Other Ambulatory Visit: Payer: Self-pay

## 2020-08-16 DIAGNOSIS — Z862 Personal history of diseases of the blood and blood-forming organs and certain disorders involving the immune mechanism: Secondary | ICD-10-CM

## 2020-08-16 NOTE — Anesthesia Preprocedure Evaluation (Addendum)
Anesthesia Evaluation  Patient identified by MRN, date of birth, ID band  Reviewed: Patient's Chart, lab work & pertinent test results  History of Anesthesia Complications (+) PONV  Airway Mallampati: II  TM Distance: >3 FB Neck ROM: Full    Dental  (+) Teeth Intact   Pulmonary asthma ,    Pulmonary exam normal        Cardiovascular + Valvular Problems/Murmurs MVP  Rhythm:Regular Rate:Normal     Neuro/Psych CVA, No Residual Symptoms negative psych ROS   GI/Hepatic Neg liver ROS, Rectal Polyp, Hx of colon polyps, Rectal bleeding, Constipation, Untintentional Weight Loss   Endo/Other  negative endocrine ROS  Renal/GU negative Renal ROS  negative genitourinary   Musculoskeletal Scoliosis   Abdominal (+)  Abdomen: soft. Bowel sounds: normal.  Peds negative pediatric ROS (+)  Hematology  (+) Sickle cell trait and anemia ,   Anesthesia Other Findings   Reproductive/Obstetrics negative OB ROS                            Anesthesia Physical Anesthesia Plan  ASA: III  Anesthesia Plan: MAC   Post-op Pain Management:    Induction:   PONV Risk Score and Plan: 3 and Ondansetron, Dexamethasone and Propofol infusion  Airway Management Planned: Simple Face Mask and Nasal Cannula  Additional Equipment: None  Intra-op Plan:   Post-operative Plan:   Informed Consent: I have reviewed the patients History and Physical, chart, labs and discussed the procedure including the risks, benefits and alternatives for the proposed anesthesia with the patient or authorized representative who has indicated his/her understanding and acceptance.     Dental advisory given  Plan Discussed with:   Anesthesia Plan Comments: (PAT note written 08/16/2020 by Myra Gianotti, [name]. Covid-19 Nucleic Acid Test Results Lab Results      Component                Value               Date                       SARSCOV2NAA              NEGATIVE            08/15/2020                SARSCOV2NAA                                  02/22/2020            RESULT: NEGATIVE      SARSCOV2NAA              Not Detected        11/22/2019           Lab Results      Component                Value               Date                      WBC                      5.8  08/15/2020                HGB                      12.0                08/15/2020                HCT                      37.1                08/15/2020                MCV                      88.1                08/15/2020                PLT                      234.0               08/15/2020          )       Anesthesia Quick Evaluation

## 2020-08-16 NOTE — Progress Notes (Signed)
Patient denies shortness of breath, fever, cough or chest pain.  PCP - Delsa Grana, [name] Cardiologist - n/a Gertie [facility]ey - Dr Rush Landmark  Chest x-ray - 05/19/20 CE (1V) EKG - 04/07/20 CE (Tracing Requested) Stress Test - n/a ECHO - n/a Cardiac Cath - n/a  Anesthesia review:  Yes  STOP now taking any Aspirin (unless otherwise instructed by your surgeon), Aleve, Naproxen, Ibuprofen, Motrin, Advil, Goody's, BC's, all herbal medications, fish oil, and all vitamins.   Coronavirus Screening Covid test on 08/15/20 was negative.  Patient verbalized understanding of instructions that were given via phone.

## 2020-08-16 NOTE — Progress Notes (Signed)
Anesthesia Chart Review: SAME DAY WORK-UP (ENDO)  Case: [id number] Date/Time: 08/19/20 1000   Procedures:      FLEXIBLE SIGMOIDOSCOPY (N/A )     ESOPHAGOGASTRODUODENOSCOPY (EGD) WITH PROPOFOL (N/A )   Anesthesia type: Monitor Anesthesia Care   Pre-op diagnosis: Rectal Polyp, Hx of colon polyps, Rectal bleeding, Constipation, Untintentional Weight Loss, Hx of Anemia   Location: [facility] ROOM [facility] / [facility]   Surgeons: Mansouraty, Telford Nab., MD      DISCUSSION: Patient is a 49 year old female scheduled for the above procedure.   History includes never smoker, post-operative N/V, asthma, Sickle cell trait, toxemia of pregnancy (with "stroke" and blood loss s/p PRBC 1992), MVP (reported no issues), anemia, scoliosis, hysterectomy (2010), hypoglycemia, headaches. Reported she is "slow to wake up" after anesthesia.   - Colonoscopy 11/29/19 & 02/26/20 [facility]): biopsy: benign inflammatory rectal polyp. Referred to [facility] for consideration of surgical versus endoscopic resection of large rectal polyp as EUS suggested layer 3 involvement.   - Colonoscopy 05/09/20 ([facility]): s/p resection of 25 mm rectal polyp 01/12/07 complicated by post-polypectomy bleeding requiring admission 05/16/20-05/24/20. Per discharge summary, "Flexible sigmoidoscopy was performed perianal exam findings included solitary 50 mm ulcer in distal rectum. Stigmata of recent bleeding present with two visible vessels, 3 hemostatic clips were placed and treated with cautery. Unfortunately, while being hospitalized, the patient had repeat BRBPR resulting in syncope, resulting in RRT to ICU, requiring repeat sigmoidoscopy with bleeding control with 11 more clips with hemospray. Following the repeat procedure, the patient's hemoglobin stabilized and the patient had no more significant rectal bleeding.Given history of significant bleeding, bleeding disorder labs were sent off, including VWF, platelet function, and factor VII assays, which were  all within normal limits. The patient will require repeat surveillance scope in three months to ensure resolution of polyps."   08/15/2020 preprocedure COVID-19 test negative.  Anesthesia team to evaluate on the day of procedure.   VS: Ht 5' 2"  (1.575 m)   Wt 48.5 kg   BMI 19.57 kg/m  BP Readings from Last 3 Encounters:  08/13/20 110/60  07/25/20 116/64  05/30/20 114/80   Pulse Readings from Last 3 Encounters:  08/13/20 83  07/25/20 84  05/30/20 92    PROVIDERS: Delsa Grana, [name] is PCP    LABS: Currently, last lab results include: Lab Results  Component Value Date   WBC 5.8 08/15/2020   HGB 12.0 08/15/2020   HCT 37.1 08/15/2020   PLT 234.0 08/15/2020   GLUCOSE 88 07/25/2020   ALT 15 07/25/2020   AST 17 07/25/2020   NA 139 07/25/2020   K 4.2 07/25/2020   CL 105 07/25/2020   CREATININE 0.79 07/25/2020   BUN 16 07/25/2020   CO2 27 07/25/2020   TSH 0.76 07/25/2020   INR 1.0 01/10/2020     IMAGES: 1V PCXR 05/19/20: FINDINGS/IMPRESSION:  Right IJ central venous line placed to the suprahepatic IVC, for which  retraction is recommended.  Lungs aerated/inflated without focal opacity. Cardiomediastinal silhouette  normal limits. No appreciable pneumothorax. Right biapical pleural  calcifications.    EKG: No recent EKG available.    CV: N/A  Past Medical History:  Diagnosis Date  . Anemia   . Asthma    Uses rescue inhaler occasionally.  . Complication of anesthesia    slow to wake up  . Headache    occasional - otc med prn  . Heart murmur    was told she had MVP when she was  around 24 years, she states she never followed up with that and has never had any issues from it  . History of blood transfusion 1992   Blood Loss from Toxemia of Pregnancy  . Hypoglycemia   . Osteoporosis    Strong FHx of Osteoporosis.  Marland Kitchen PONV (postoperative nausea and vomiting)   . Scoliosis    Seen Dr. Jefm Bryant  . Sickle-cell trait ([place])   . Stroke [facility]) 1992   Toxemia of  Pregnancy     Past Surgical History:  Procedure Laterality Date  . ABDOMINAL HYSTERECTOMY  2010   total -Menopausal symptoms, takes HRT  . BIOPSY  02/26/2020   Procedure: BIOPSY;  Surgeon: Rush Landmark Telford Nab., MD;  Location: [place];  Service: Gastroenterology;;  . BREAST EXCISIONAL BIOPSY Left 2003   neg bx dr Jamal Collin  . BREAST SURGERY Left 2004   biopsy, benign  . COLONOSCOPY W/ BIOPSIES  05/09/2020   [facility] - removed a "Cap Polyp" per patient  . COLONOSCOPY WITH PROPOFOL N/A 11/29/2019   Procedure: COLONOSCOPY WITH PROPOFOL;  Surgeon: Virgel Manifold, MD;  Location: [facility] ENDOSCOPY;  Service: Endoscopy;  Laterality: N/A;  . COLONOSCOPY WITH PROPOFOL N/A 02/26/2020   Procedure: COLONOSCOPY WITH PROPOFOL;  Surgeon: Rush Landmark Telford Nab., MD;  Location: [place];  Service: Gastroenterology;  Laterality: N/A;  . CYST REMOVAL HAND Right 2005-06  . EUS  02/26/2020   Procedure: LOWER ENDOSCOPIC ULTRASOUND (EUS);  Surgeon: Irving Copas., MD;  Location: [place];  Service: Gastroenterology;;  . Otho Darner SIGMOIDOSCOPY  05/17/20, 05/19/20   x 2 at [place]  2774   umbilical  . TONSILLECTOMY  1978    MEDICATIONS: . cyanocobalamin ((VITAMIN B-12)) injection 1,000 mcg   . acetaminophen (TYLENOL) 325 MG tablet  . albuterol (VENTOLIN HFA) 108 (90 Base) MCG/ACT inhaler  . alendronate-cholecalciferol (FOSAMAX PLUS D) 70-2800 MG-UNIT tablet  . calcium carbonate (OSCAL) 1500 (600 Ca) MG TABS tablet  . Cyanocobalamin (B-12 COMPLIANCE INJECTION) 1000 MCG/ML KIT  . diphenhydrAMINE (BENADRYL) 25 MG tablet  . ferrous gluconate (FERGON) 324 MG tablet  . metroNIDAZOLE (METROGEL VAGINAL) 0.75 % vaginal gel  . naproxen (NAPROSYN) 500 MG tablet  . polyethylene glycol powder (GLYCOLAX/MIRALAX) 17 GM/SCOOP powder  . tiZANidine (ZANAFLEX) 4 MG tablet  . Cholecalciferol (VITAMIN D3) 125 MCG (5000 UT) TABS  . thiamine 100 MG tablet    Myra Gianotti,  [name] Surgical Short Stay/Anesthesiology [facility] Phone [phone] [facility] Phone [phone] 08/16/2020 11:11 AM

## 2020-08-19 ENCOUNTER — Ambulatory Visit (HOSPITAL_COMMUNITY): Payer: BC Managed Care – PPO | Admitting: Vascular Surgery

## 2020-08-19 ENCOUNTER — Encounter (HOSPITAL_COMMUNITY): Payer: Self-pay | Admitting: Gastroenterology

## 2020-08-19 ENCOUNTER — Ambulatory Visit (HOSPITAL_COMMUNITY)
Admission: RE | Admit: 2020-08-19 | Discharge: 2020-08-19 | Disposition: A | Discharge status: Home / Self Care | Payer: BC Managed Care – PPO | Attending: Gastroenterology | Admitting: Gastroenterology

## 2020-08-19 ENCOUNTER — Other Ambulatory Visit: Payer: Self-pay | Admitting: Physician Assistant

## 2020-08-19 ENCOUNTER — Encounter (HOSPITAL_COMMUNITY)
Admission: RE | Disposition: A | Discharge status: Home / Self Care | Payer: Self-pay | Source: Home / Self Care | Attending: Gastroenterology

## 2020-08-19 ENCOUNTER — Other Ambulatory Visit: Payer: Self-pay

## 2020-08-19 DIAGNOSIS — R634 Abnormal weight loss: Secondary | ICD-10-CM | POA: Diagnosis not present

## 2020-08-19 DIAGNOSIS — Z7983 Long term (current) use of bisphosphonates: Secondary | ICD-10-CM | POA: Insufficient documentation

## 2020-08-19 DIAGNOSIS — Z79899 Other long term (current) drug therapy: Secondary | ICD-10-CM | POA: Diagnosis not present

## 2020-08-19 DIAGNOSIS — K621 Rectal polyp: Secondary | ICD-10-CM

## 2020-08-19 DIAGNOSIS — Z681 Body mass index (BMI) 19 or less, adult: Secondary | ICD-10-CM | POA: Insufficient documentation

## 2020-08-19 DIAGNOSIS — Z862 Personal history of diseases of the blood and blood-forming organs and certain disorders involving the immune mechanism: Secondary | ICD-10-CM

## 2020-08-19 DIAGNOSIS — D649 Anemia, unspecified: Secondary | ICD-10-CM | POA: Diagnosis not present

## 2020-08-19 DIAGNOSIS — K3189 Other diseases of stomach and duodenum: Secondary | ICD-10-CM

## 2020-08-19 DIAGNOSIS — K641 Second degree hemorrhoids: Secondary | ICD-10-CM | POA: Diagnosis not present

## 2020-08-19 DIAGNOSIS — Z8673 Personal history of transient ischemic attack (TIA), and cerebral infarction without residual deficits: Secondary | ICD-10-CM | POA: Diagnosis not present

## 2020-08-19 DIAGNOSIS — M810 Age-related osteoporosis without current pathological fracture: Secondary | ICD-10-CM | POA: Diagnosis not present

## 2020-08-19 DIAGNOSIS — K626 Ulcer of anus and rectum: Secondary | ICD-10-CM | POA: Insufficient documentation

## 2020-08-19 DIAGNOSIS — J45909 Unspecified asthma, uncomplicated: Secondary | ICD-10-CM | POA: Diagnosis not present

## 2020-08-19 DIAGNOSIS — K319 Disease of stomach and duodenum, unspecified: Secondary | ICD-10-CM | POA: Insufficient documentation

## 2020-08-19 DIAGNOSIS — K644 Residual hemorrhoidal skin tags: Secondary | ICD-10-CM | POA: Diagnosis not present

## 2020-08-19 DIAGNOSIS — K625 Hemorrhage of anus and rectum: Secondary | ICD-10-CM

## 2020-08-19 DIAGNOSIS — K6289 Other specified diseases of anus and rectum: Secondary | ICD-10-CM

## 2020-08-19 DIAGNOSIS — Z86010 Personal history of colonic polyps: Secondary | ICD-10-CM

## 2020-08-19 DIAGNOSIS — K2950 Unspecified chronic gastritis without bleeding: Secondary | ICD-10-CM | POA: Insufficient documentation

## 2020-08-19 DIAGNOSIS — K449 Diaphragmatic hernia without obstruction or gangrene: Secondary | ICD-10-CM | POA: Insufficient documentation

## 2020-08-19 DIAGNOSIS — K5909 Other constipation: Secondary | ICD-10-CM | POA: Diagnosis not present

## 2020-08-19 DIAGNOSIS — D573 Sickle-cell trait: Secondary | ICD-10-CM | POA: Insufficient documentation

## 2020-08-19 DIAGNOSIS — D509 Iron deficiency anemia, unspecified: Secondary | ICD-10-CM | POA: Diagnosis not present

## 2020-08-19 DIAGNOSIS — Z8719 Personal history of other diseases of the digestive system: Secondary | ICD-10-CM | POA: Insufficient documentation

## 2020-08-19 DIAGNOSIS — R933 Abnormal findings on diagnostic imaging of other parts of digestive tract: Secondary | ICD-10-CM

## 2020-08-19 DIAGNOSIS — K5900 Constipation, unspecified: Secondary | ICD-10-CM

## 2020-08-19 HISTORY — PX: BIOPSY: SHX5522

## 2020-08-19 HISTORY — PX: ESOPHAGOGASTRODUODENOSCOPY (EGD) WITH PROPOFOL: SHX5813

## 2020-08-19 HISTORY — DX: Anemia, unspecified: D64.9

## 2020-08-19 HISTORY — PX: COLONOSCOPY: SHX5424

## 2020-08-19 SURGERY — ESOPHAGOGASTRODUODENOSCOPY (EGD) WITH PROPOFOL
Anesthesia: Monitor Anesthesia Care

## 2020-08-19 MED ORDER — ONDANSETRON HCL 4 MG/2ML IJ SOLN: 4.0000 mg | Freq: Once | INTRAMUSCULAR | Status: DC | PRN

## 2020-08-19 MED ORDER — PROPOFOL 10 MG/ML IV BOLUS: INTRAVENOUS | Status: DC | PRN

## 2020-08-19 MED ORDER — ACETAMINOPHEN 325 MG PO TABS: 325.0000 mg | ORAL_TABLET | ORAL | Status: DC | PRN

## 2020-08-19 MED ORDER — PROPOFOL 500 MG/50ML IV EMUL
INTRAVENOUS | Status: DC | PRN
  Administered 2020-08-19: 200 ug/kg/min via INTRAVENOUS

## 2020-08-19 MED ORDER — PROPOFOL 10 MG/ML IV BOLUS
INTRAVENOUS | Status: DC | PRN
  Administered 2020-08-19: 20 mg via INTRAVENOUS

## 2020-08-19 MED ORDER — LIDOCAINE 2% (20 MG/ML) 5 ML SYRINGE
INTRAMUSCULAR | Status: DC | PRN
  Administered 2020-08-19: 50 mg via INTRAVENOUS

## 2020-08-19 MED ORDER — HYDROCORTISONE ACETATE 25 MG RE SUPP: 25.0000 mg | Freq: Every day | RECTAL | 0 refills | Status: DC

## 2020-08-19 MED ORDER — SODIUM CHLORIDE 0.9 % IV SOLN: INTRAVENOUS | Status: DC

## 2020-08-19 MED ORDER — LACTATED RINGERS IV SOLN: INTRAVENOUS | Status: DC | PRN

## 2020-08-19 MED ORDER — ACETAMINOPHEN 160 MG/5ML PO SOLN: 325.0000 mg | ORAL | Status: DC | PRN

## 2020-08-19 MED ORDER — FENTANYL CITRATE (PF) 100 MCG/2ML IJ SOLN: 25.0000 ug | INTRAMUSCULAR | Status: DC | PRN

## 2020-08-19 SURGICAL SUPPLY — 14 items

## 2020-08-19 NOTE — Anesthesia Procedure Notes (Signed)
Procedure Name: MAC Date/Time: 08/19/2020 10:00 AM Performed by: Amadeo Garnet, CRNA Pre-anesthesia Checklist: Patient identified, Emergency Drugs available, Suction available and Patient being monitored Patient Re-evaluated:Patient Re-evaluated prior to induction Oxygen Delivery Method: Nasal cannula Preoxygenation: Pre-oxygenation with 100% oxygen Induction Type: IV induction Placement Confirmation: positive ETCO2 Dental Injury: Teeth and Oropharynx as per pre-operative assessment

## 2020-08-19 NOTE — Interval H&P Note (Signed)
History and Physical Interval Note:  08/19/2020 9:52 AM  Angela Gordon  has presented today for surgery, with the diagnosis of Rectal Polyp, Hx of colon polyps, Rectal bleeding, Constipation, Untintentional Weight Loss, Hx of Anemia.  The various methods of treatment have been discussed with the patient and family. After consideration of risks, benefits and other options for treatment, the patient has consented to  Procedure(s): FLEXIBLE SIGMOIDOSCOPY (N/A) ESOPHAGOGASTRODUODENOSCOPY (EGD) WITH PROPOFOL (N/A) as a surgical intervention.  The patient's history has been reviewed, patient examined, no change in status, stable for surgery.  I have reviewed the patient's chart and labs.  Questions were answered to the patient's satisfaction.    With history of IDA will need to rule out upper GI source of iron deficiency so will begin with EGD then Flexible sigmoidoscopy.   [employer]

## 2020-08-19 NOTE — Op Note (Signed)
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 Estimated Blood Loss:     Estimated blood loss was minimal. Procedure:                Pre-Anesthesia Assessment:                           - Prior to the procedure, a History and Physical                            was performed, and patient medications and                            allergies were reviewed. The patient's tolerance of                            previous anesthesia was also reviewed. The risks                            and benefits of the procedure and the sedation                            options and risks were discussed with the patient.                            All questions were answered, and informed consent                            was obtained. Prior Anticoagulants: The patient has  taken no previous anticoagulant or antiplatelet                            agents. ASA Grade Assessment: II - A patient with                            mild systemic disease. After reviewing the risks                            and benefits, the patient was deemed in                            satisfactory condition to undergo the procedure.                            After obtaining informed consent, the colonoscope                            was passed under direct vision. Throughout the                            procedure, the patient's blood pressure, pulse, and                            oxygen saturations were monitored continuously. The                            GIF-H190 ([id number]) Olympus gastroscope was                            introduced through the anus and advanced to the 5                            cm into the ileum. After obtaining informed                            consent, the colonoscope was passed under direct                            vision. Throughout the procedure, the patient's                            blood pressure, pulse, and oxygen saturations were                            monitored continuously. The colonoscopy was                            performed without difficulty. The patient tolerated                            the procedure. The quality of the bowel preparation  was good. The terminal ileum, ileocecal valve,                            appendiceal orifice, and rectum were photographed. Scope In: 10:25:35 AM Scope Out: 10:47:08 AM Scope Withdrawal Time: 0 hours 18 minutes 19 seconds  Total Procedure Duration: 0 hours 21 minutes 33 seconds  Findings:      The digital rectal exam findings include palpable rectal ulcer (found in       anterior position) and hemorrhoids/skin tags. Pertinent negatives       include no palpable rectal lesions.      The terminal ileum and ileocecal valve appeared normal.      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 There was residual polypoid tissue as well. This region       was biopsied with a cold forceps for histology. Oozing noted but began       to slow at completion of procedure from the biopsies.      Normal mucosa was found in the entire colon otherwise.      Non-bleeding non-thrombosed  external and internal hemorrhoids were found       during retroflexion, during perianal exam and during digital exam. The       hemorrhoids were Grade II (internal hemorrhoids that prolapse but reduce       spontaneously). Impression:               - Palpable rectal ulcer and hemorrhoids found on                            digital rectal exam.                           - The examined portion of the ileum was normal.                           - Post-ESD scar with ulceration and granulation in                            the distal rectum. Biopsied.                           - Normal mucosa in the entire examined colon                            otherwise.                           - Non-bleeding non-thrombosed external and internal                            hemorrhoids. Recommendation:           - The patient will be observed post-procedure,                            until all discharge criteria are met.                           -   Return patient to hospital ward for ongoing care.                           - Patient has a contact number available for                            emergencies. The signs and symptoms of potential                            delayed complications were discussed with the                            patient. Return to normal activities tomorrow.                            Written discharge instructions were provided to the                            patient.                           - Resume previous diet.                           - Continue to try to maintain soft stools as much                            as possible. Use of Colace 200 mg daily + Miralax                            1-2 times daily PRN.                           - Will begin by trialing Anusol suppositories QHS                            and then every other evening for a total of 12                            suppositories.                           - If issues persist will need to consider  Carafate                            Enemas.                           - I think referral to Colorectal Surgery at [facility] is                            reasonable or one of the quaternary centers should                            she   and husband desire if patient continues to have                            persistent/recurrent rectal bleeding/spotting and                            if biopsies are consistent with recurrent CAP                            Polyposis as this is unlikely to be endoscopically                            amenable to therapy at this time if it has recurred                            post ESD and ulceration persists. Hopefully we can                            get ulceration healed as noted above.                           - Will need to see how her Iron studies look in the                            next 4-weeks. If still Iron deficient then will                            need IV Iron infusions.                           - The findings and recommendations were discussed                            with the patient.                           - The findings and recommendations were discussed                            with the patient's family. Procedure Code(s):        --- Professional ---                           45380, Colonoscopy, flexible; with biopsy, single                            or multiple Diagnosis Code(s):        --- Professional ---                           Z86.010, Personal history of colonic polyps                           K62.89, Other specified diseases of anus and rectum                             K64.1, Second degree hemorrhoids                           Z98.890, Other specified postprocedural states CPT copyright 2019 American [employer]. All rights reserved. The codes documented in this report are preliminary and upon coder review may  be revised to meet current compliance requirements.  Mansouraty, MD 08/19/2020 11:18:58  AM Number of Addenda: 0 

## 2020-08-19 NOTE — Op Note (Addendum)
[facility] Patient Name: Angela Gordon Procedure Date : 08/19/2020 MRN: [id number] Attending MD: Justice Britain , MD Date of Birth: 1970/12/25 CSN: [id number] Age: 49 Admit Type: Outpatient Procedure:                Upper GI endoscopy Indications:              Iron deficiency anemia Providers:                Justice Britain, MD, Burtis Junes, RN, Cletis Athens,                            Technician Referring MD:             Delsa Grana, Lennette Bihari Bonna Gains MD, MD Medicines:                Monitored Anesthesia Care Complications:            No immediate complications. Estimated Blood Loss:     Estimated blood loss was minimal. Procedure:                Pre-Anesthesia Assessment:                           - Prior to the procedure, a History and Physical                            was performed, and patient medications and                            allergies were reviewed. The patient's tolerance of                            previous anesthesia was also reviewed. The risks                            and benefits of the procedure and the sedation                            options and risks were discussed with the patient.                            All questions were answered, and informed consent                            was obtained. Prior Anticoagulants: The patient has                            taken no previous anticoagulant or antiplatelet                            agents. ASA Grade Assessment: II - A patient with                            mild systemic disease. After reviewing the risks  and benefits, the patient was deemed in                            satisfactory condition to undergo the procedure.                           After obtaining informed consent, the endoscope was                            passed under direct vision. Throughout the                            procedure, the patient's blood pressure, pulse, and                             oxygen saturations were monitored continuously. The                            GIF-H190 ([id number]) Olympus gastroscope was                            introduced through the mouth, and advanced to the                            second part of duodenum. The upper GI endoscopy was                            accomplished without difficulty. The patient                            tolerated the procedure. Scope In: Scope Out: Findings:      No gross lesions were noted in the entire esophagus.      The Z-line was regular and was found 37 cm from the incisors.      A 2 cm hiatal hernia was present.      Patchy mildly erythematous mucosa without bleeding was found in the       gastric antrum.      No other gross lesions were noted in the entire examined stomach.       Biopsies were taken with a cold forceps for histology and Helicobacter       pylori testing.      No gross lesions were noted in the duodenal bulb, in the first portion       of the duodenum and in the second portion of the duodenum. Biopsies for       histology were taken with a cold forceps for evaluation of celiac       disease. Impression:               - No gross lesions in esophagus.                           - Z-line regular, 37 cm from the incisors.                           - 2 cm hiatal hernia.                           -  Erythematous mucosa in the antrum. No other gross                            lesions in the stomach. Biopsied.                           - No gross lesions in the duodenal bulb, in the                            first portion of the duodenum and in the second                            portion of the duodenum. Biopsied. Recommendation:           - Proceed to scheduled flexible sigmoidoscopy.                           - Await pathology results.                           - Based on final results of pathology on stomach                            biopsies will consider initiation of  PPI therapy.                           - The findings and recommendations were discussed                            with the patient.                           - The findings and recommendations were discussed                            with the patient's family. Procedure Code(s):        --- Professional ---                           [phone], Esophagogastroduodenoscopy, flexible,                            transoral; with biopsy, single or multiple Diagnosis Code(s):        --- Professional ---                           K44.9, Diaphragmatic hernia without obstruction or                            gangrene                           K31.89, Other diseases of stomach and duodenum                           D50.9, Iron deficiency anemia, unspecified CPT copyright  2019 American Medical Association. All rights reserved. The codes documented in this report are preliminary and upon coder review may  be revised to meet current compliance requirements. Justice Britain, MD 08/19/2020 11:03:52 AM Number of Addenda: 0

## 2020-08-19 NOTE — Discharge Instructions (Signed)
YOU HAD AN ENDOSCOPIC PROCEDURE TODAY: Refer to the procedure report and other information in the discharge instructions given to you for any specific questions about what was found during the examination. If this information does not answer your questions, please call [place] office at [phone] to clarify.  ° °YOU SHOULD EXPECT: Some feelings of bloating in the abdomen. Passage of more gas than usual. Walking can help get rid of the air that was put into your GI tract during the procedure and reduce the bloating. If you had a lower endoscopy (such as a colonoscopy or flexible sigmoidoscopy) you may notice spotting of blood in your stool or on the toilet paper. Some abdominal soreness may be present for a day or two, also. ° °DIET: Your first meal following the procedure should be a light meal and then it is ok to progress to your normal diet. A half-sandwich or bowl of soup is an example of a good first meal. Heavy or fried foods are harder to digest and may make you feel nauseous or bloated. Drink plenty of fluids but you should avoid alcoholic beverages for 24 hours. If you had a esophageal dilation, please see attached instructions for diet.   ° °ACTIVITY: Your care partner should take you home directly after the procedure. You should plan to take it easy, moving slowly for the rest of the day. You can resume normal activity the day after the procedure however YOU SHOULD NOT DRIVE, use power tools, machinery or perform tasks that involve climbing or major physical exertion for 24 hours (because of the sedation medicines used during the test).  ° °SYMPTOMS TO REPORT IMMEDIATELY: °A gastroenterologist can be reached at any hour. Please call [phone]  for any of the following symptoms:  °Following lower endoscopy (colonoscopy, flexible sigmoidoscopy) °Excessive amounts of blood in the stool  °Significant tenderness, worsening of abdominal pains  °Swelling of the abdomen that is new, acute  °Fever of 100° or  higher  °Following upper endoscopy (EGD, EUS, ERCP, esophageal dilation) °Vomiting of blood or coffee ground material  °New, significant abdominal pain  °New, significant chest pain or pain under the shoulder blades  °Painful or persistently difficult swallowing  °New shortness of breath  °Black, tarry-looking or red, bloody stools ° °FOLLOW UP:  °If any biopsies were taken you will be contacted by phone or by letter within the next 1-3 weeks. Call [phone]  if you have not heard about the biopsies in 3 weeks.  °Please also call with any specific questions about appointments or follow up tests. ° °

## 2020-08-19 NOTE — Transfer of Care (Signed)
Immediate Anesthesia Transfer of Care Note  Patient: Angela Gordon  Procedure(s) Performed: ESOPHAGOGASTRODUODENOSCOPY (EGD) WITH PROPOFOL (N/A ) BIOPSY COLONOSCOPY (N/A )  Patient Location: PACU  Anesthesia Type:MAC  Level of Consciousness: awake, alert  and oriented  Airway & Oxygen Therapy: Patient Spontanous Breathing  Post-op Assessment: Report given to RN, Post -op Vital signs reviewed and stable and Patient moving all extremities  Post vital signs: Reviewed and stable  Last Vitals:  Vitals Value Taken Time  BP    Temp    Pulse    Resp    SpO2      Last Pain:  Vitals:   08/19/20 0836  TempSrc: Oral  PainSc: 0-No pain         Complications: No complications documented.

## 2020-08-19 NOTE — Anesthesia Postprocedure Evaluation (Signed)
Anesthesia Post Note  Patient: KENTLEY CEDILLO  Procedure(s) Performed: ESOPHAGOGASTRODUODENOSCOPY (EGD) WITH PROPOFOL (N/A ) BIOPSY COLONOSCOPY (N/A )     Patient location during evaluation: PACU Anesthesia Type: MAC Level of consciousness: awake and alert Pain management: pain level controlled Vital Signs Assessment: post-procedure vital signs reviewed and stable Respiratory status: spontaneous breathing, nonlabored ventilation, respiratory function stable and patient connected to nasal cannula oxygen Cardiovascular status: stable and blood pressure returned to baseline Postop Assessment: no apparent nausea or vomiting Anesthetic complications: no   No complications documented.  Last Vitals:  Vitals:   08/19/20 1125 08/19/20 1134  BP: 132/82 (!) 130/91  Pulse: (!) 56 (!) 59  Resp: 12 11  Temp:    SpO2: 100% 100%    Last Pain:  Vitals:   08/19/20 1134  TempSrc:   PainSc: 0-No pain                 Belenda Cruise P Jerrell Mangel

## 2020-08-20 ENCOUNTER — Other Ambulatory Visit: Payer: Self-pay

## 2020-08-20 ENCOUNTER — Encounter: Payer: Self-pay | Admitting: Gastroenterology

## 2020-08-20 LAB — SURGICAL PATHOLOGY

## 2020-08-20 MED ORDER — OMEPRAZOLE 20 MG PO CPDR: DELAYED_RELEASE_CAPSULE | ORAL | 3 refills | Status: AC

## 2020-08-21 ENCOUNTER — Encounter (HOSPITAL_COMMUNITY): Payer: Self-pay | Admitting: Gastroenterology

## 2020-08-29 NOTE — Progress Notes (Signed)
Name: Angela Gordon   MRN: [id number]    DOB: 10/11/1971   Date:08/30/2020       Progress Note  Chief Complaint  Patient presents with  . Follow-up     Subjective:   Angela Gordon is a 49 y.o. female, presents to clinic for f/up on GI problems, hip pain, anemia  She gradually improved hip pain over the past two weeks, suspects she overdid it and had a simple strain.2  B12 - per GI  Osteoporosis: fosamax - saw Rheumatology for this On fosamax for 3 years Last bone density Dec - reviewed with her today, good med compliance, no dysphagia- needs refills  Repeated GI procedures went well and pathology was reassuring  Anemia Hemoglobin  Date Value Ref Range Status  08/15/2020 12.0 12.0 - 15.0 g/dL Final  07/25/2020 12.6 12.0 - 15.0 g/dL Final  05/30/2020 10.3 (L) 11.7 - 15.5 g/dL Final  01/10/2020 14.9 12.0 - 15.0 g/dL Final  doing well after infusion and supplements  Weight loss and GI sx- improving with improving health and decreased situational stress.  She has less GI sx, appetite is getting better.  Slowly gaining back some weight Wt Readings from Last 5 Encounters:  08/30/20 108 lb 6.4 oz (49.2 kg)  08/16/20 107 lb (48.5 kg)  08/13/20 107 lb 11.2 oz (48.9 kg)  07/25/20 104 lb 9.6 oz (47.4 kg)  05/30/20 103 lb (46.7 kg)   BMI Readings from Last 5 Encounters:  08/30/20 19.83 kg/m  08/16/20 19.57 kg/m  08/13/20 19.70 kg/m  07/25/20 19.13 kg/m  05/30/20 18.84 kg/m          Current Outpatient Medications:  .  acetaminophen (TYLENOL) 325 MG tablet, Take 325-650 mg by mouth every 6 (six) hours as needed for moderate pain., Disp: , Rfl:  .  albuterol (VENTOLIN HFA) 108 (90 Base) MCG/ACT inhaler, Inhale 2 puffs into the lungs every 6 (six) hours as needed for wheezing or shortness of breath., Disp: , Rfl:  .  alendronate-cholecalciferol (FOSAMAX PLUS D) 70-2800 MG-UNIT tablet, Take 1 tablet by mouth every Wednesday. Take with a full glass of water on an  empty stomach., Disp: , Rfl:  .  calcium carbonate (OSCAL) 1500 (600 Ca) MG TABS tablet, Take 600 mg of elemental calcium by mouth 2 (two) times daily., Disp: , Rfl:  .  Cholecalciferol (VITAMIN D3) 125 MCG (5000 UT) TABS, Take 5,000 Units by mouth every Monday. , Disp: , Rfl:  .  Cyanocobalamin (B-12 COMPLIANCE INJECTION) 1000 MCG/ML KIT, Inject 1 Dose as directed every 30 (thirty) days., Disp: , Rfl:  .  diphenhydrAMINE (BENADRYL) 25 MG tablet, Take 25 mg by mouth daily as needed for allergies., Disp: , Rfl:  .  ferrous gluconate (FERGON) 324 MG tablet, Take 1 tablet (324 mg total) by mouth daily with breakfast., Disp: 30 tablet, Rfl: 3 .  hydrocortisone (ANUSOL-HC) 25 MG suppository, Place 1 suppository (25 mg total) rectally at bedtime. Nightly for 1 week and then every other night until completion of suppositories., Disp: 12 suppository, Rfl: 0 .  metroNIDAZOLE (METROGEL VAGINAL) 0.75 % vaginal gel, Place 1 Applicatorful vaginally 2 (two) times daily. (Patient taking differently: Place 1 Applicatorful vaginally daily as needed (irritation). ), Disp: 70 g, Rfl: 0 .  naproxen (NAPROSYN) 500 MG tablet, Take 500 mg by mouth 2 (two) times daily as needed for moderate pain., Disp: , Rfl:  .  omeprazole (PRILOSEC) 20 MG capsule, Take 1 capsule (20 mg total) by mouth  daily for 30 days, THEN 1 capsule (20 mg total) daily., Disp: 90 capsule, Rfl: 3 .  polyethylene glycol powder (GLYCOLAX/MIRALAX) 17 GM/SCOOP powder, Take 17 g by mouth daily as needed (constipation). (Patient taking differently: Take 17 g by mouth daily as needed for moderate constipation. ), Disp: 3350 g, Rfl: 1 .  progesterone (PROMETRIUM) 100 MG capsule, Take 100 mg by mouth at bedtime., Disp: , Rfl:  .  thiamine 100 MG tablet, Take 100 mg by mouth daily., Disp: , Rfl:  .  tiZANidine (ZANAFLEX) 4 MG tablet, Take 4 mg by mouth every 8 (eight) hours as needed for muscle spasms., Disp: , Rfl:   Current Facility-Administered Medications:  .   cyanocobalamin ((VITAMIN B-12)) injection 1,000 mcg, 1,000 mcg, Intramuscular, Q30 days, Steele Sizer, MD, 1,000 mcg at 06/25/20 0901  Patient Active Problem List   Diagnosis Date Noted  . Cap polyposis ([place]) 07/27/2020  . Abnormal colonoscopy 07/27/2020  . History of anemia 07/27/2020  . Constipation 07/27/2020  . Unintentional weight loss 07/27/2020  . Intestinal malabsorption 05/31/2020  . Hypocalcemia 05/31/2020  . Anemia 05/31/2020  . B12 deficiency 05/31/2020  . Protein-calorie malnutrition ([place]) 05/31/2020  . IBD (inflammatory bowel disease) 05/31/2020  . Rectal polyp 01/11/2020  . Rectal bleeding 01/11/2020  . Encounter for screening colonoscopy   . Rectal mass   . Osteoporosis 10/06/2019  . Family history of ovarian cancer 07/26/2017  . Vitamin D deficiency 05/07/2016  . Sickle-cell trait ([place]) 05/07/2016  . 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 ABDOMINAL HYSTERECTOMY  2010   total -Menopausal symptoms, takes HRT  . BIOPSY  02/26/2020   Procedure: BIOPSY;  Surgeon: Rush Landmark Telford Nab., MD;  Location: [place];  Service: Gastroenterology;;  . BIOPSY  08/19/2020   Procedure: BIOPSY;  Surgeon: Irving Copas., MD;  Location: [place];  Service: Gastroenterology;;  . BREAST EXCISIONAL BIOPSY Left 2003   neg bx dr Jamal Collin  . BREAST SURGERY Left 2004   biopsy, benign  . COLONOSCOPY N/A 08/19/2020   Procedure: COLONOSCOPY;  Surgeon: Mansouraty, Telford Nab., MD;  Location: [place];  Service: Gastroenterology;  Laterality: N/A;  . COLONOSCOPY W/ BIOPSIES  05/09/2020   [facility] - removed a "Cap Polyp" per patient  . COLONOSCOPY WITH PROPOFOL N/A 11/29/2019   Procedure: COLONOSCOPY WITH PROPOFOL;  Surgeon: Virgel Manifold, MD;  Location: [facility] ENDOSCOPY;  Service: Endoscopy;  Laterality: N/A;  . COLONOSCOPY WITH PROPOFOL N/A 02/26/2020   Procedure: COLONOSCOPY WITH PROPOFOL;  Surgeon:  Rush Landmark Telford Nab., MD;  Location: [place];  Service: Gastroenterology;  Laterality: N/A;  . CYST REMOVAL HAND Right 2005-06  . ESOPHAGOGASTRODUODENOSCOPY (EGD) WITH PROPOFOL N/A 08/19/2020   Procedure: ESOPHAGOGASTRODUODENOSCOPY (EGD) WITH PROPOFOL;  Surgeon: Rush Landmark Telford Nab., MD;  Location: [place];  Service: Gastroenterology;  Laterality: N/A;  . EUS  02/26/2020   Procedure: LOWER ENDOSCOPIC ULTRASOUND (EUS);  Surgeon: Rush Landmark Telford Nab., MD;  Location: [facility] ENDOSCOPY;  Service: Gastroenterology;;  . Otho Darner SIGMOIDOSCOPY  05/17/20, 05/19/20   x 2 at [place]  [id number]   umbilical  . TONSILLECTOMY  1978    Family History  Problem Relation Age of Onset  . Hypertension Mother   . Osteoporosis Mother   . Cervical cancer Mother        Hysterectomy in 33s.  . Stroke Mother   . Crohn's disease Mother   . Irritable bowel syndrome Mother   . Sickle cell trait  Father   . Sickle cell trait Sister   . Heart disease Maternal Aunt   . Breast cancer Maternal Aunt   . Heart disease Maternal Uncle   . Hypertension Maternal Uncle   . Ovarian cancer Paternal Aunt 59  . Heart disease Maternal Grandmother   . Hypertension Maternal Grandmother   . Osteoporosis Maternal Grandmother   . Rheum arthritis Maternal Grandfather   . Hypertension Maternal Grandfather   . Prostate cancer Maternal Grandfather   . Leukemia Cousin   . Colon cancer Neg Hx   . Esophageal cancer Neg Hx   . Pancreatic cancer Neg Hx   . Stomach cancer Neg Hx   . Inflammatory bowel disease Neg Hx   . Liver disease Neg Hx   . Rectal cancer Neg Hx     Social History   Tobacco Use  . Smoking status: Never Smoker  . Smokeless tobacco: Never Used  Vaping Use  . Vaping Use: Never used  Substance Use Topics  . Alcohol use: Not Currently    Alcohol/week: 1.0 standard drink    Types: 1 Glasses of wine per week  . Drug use: No     Allergies  Allergen Reactions  . Erythromycin  Base Other (See Comments)    Several family members have severe side effects, pt has never taken but should avoid   . Penicillins Diarrhea and Rash    Did it involve swelling of the face/tongue/throat, SOB, or low BP? Unknown Did it involve sudden or severe rash/hives, skin peeling, or any reaction on the inside of your mouth or nose? Unknown Did you need to seek medical attention at a hospital or doctor's office? Unknown When did it last happen?Childhood If all above answers are "NO", may proceed with cephalosporin use.  Jola Babinski Allergy] Anaphylaxis    Health Maintenance  Topic Date Due  . Hepatitis C Screening  Never done  . INFLUENZA VACCINE  02/27/2021 (Originally 06/30/2020)  . HIV Screening  05/03/2021 (Originally 03/20/1987)  . MAMMOGRAM  09/07/2020  . DEXA SCAN  11/05/2021  . TETANUS/TDAP  05/08/2028  . COVID-19 Vaccine  Completed  . PAP SMEAR-Modifier  Discontinued    Chart Review Today: I personally reviewed active problem list, medication list, allergies, family history, social history, health maintenance, notes from last encounter, lab results, imaging with the patient/caregiver today.   Review of Systems  10 Systems reviewed and are negative for acute change except as noted in the HPI.  Objective:   Vitals:   08/30/20 1142  BP: 110/62  Pulse: 100  Resp: 16  Temp: 98.5 F (36.9 C)  SpO2: (!) 78%  Weight: 108 lb 6.4 oz (49.2 kg)  Height: _0  (1.575 m)    Body mass index is 19.83 kg/m.  Physical Exam Vitals and nursing note reviewed.  Constitutional:      General: She is not in acute distress.    Appearance: Normal appearance. She is not ill-appearing, toxic-appearing or diaphoretic.     Comments: Thin, well appearing  HENT:     Head: Normocephalic and atraumatic.  Eyes:     General:        Right eye: No discharge.        Left eye: No discharge.     Conjunctiva/sclera: Conjunctivae normal.  Cardiovascular:     Rate and Rhythm:  Normal rate and regular rhythm.     Pulses: Normal pulses.  Pulmonary:     Effort: Pulmonary effort is normal.  Breath sounds: Normal breath sounds.  Abdominal:     General: Bowel sounds are normal.     Palpations: Abdomen is soft.  Skin:    General: Skin is warm and dry.     Coloration: Skin is not jaundiced or pale.  Neurological:     Mental Status: She is alert. Mental status is at baseline.  Psychiatric:        Mood and Affect: Mood normal.        Behavior: Behavior normal.         Assessment & Plan:   1. Right hip pain Improved with resting, likely a strain, encouraged gradual return to activity and careful physical activity and conditioning and strengthening prior to anything very strenuous  2. Osteoporosis, post-menopausal meds refills, last dexa reviewed  3. IBD (inflammatory bowel disease) Improving symptoms, per GI  4. Protein-calorie malnutrition, unspecified severity ([place]) Diet and appetite has improved she is gaining back weight likely due to situational stress and somatic and medical GI symptoms  5. Anemia, unspecified type Improved hemoglobin and hematocrit, monitoring with GI she will continue to supplement  6. Sickle-cell trait ([place]) Monitoring labs  7. Unintentional weight loss Improving, starting to gain back weight  8. Medication monitoring encounter She is doing well on Fosamax no side effects at this time   Delsa Grana, [name] 08/30/20 12:11 PM

## 2020-08-30 ENCOUNTER — Ambulatory Visit (INDEPENDENT_AMBULATORY_CARE_PROVIDER_SITE_OTHER): Payer: BC Managed Care – PPO | Admitting: Family Medicine

## 2020-08-30 ENCOUNTER — Encounter: Payer: Self-pay | Admitting: Family Medicine

## 2020-08-30 ENCOUNTER — Other Ambulatory Visit: Payer: Self-pay

## 2020-08-30 VITALS — BP 110/62 | HR 100 | Temp 98.5°F | Resp 16 | Ht 62.0 in | Wt 108.4 lb

## 2020-08-30 DIAGNOSIS — D649 Anemia, unspecified: Secondary | ICD-10-CM

## 2020-08-30 DIAGNOSIS — D573 Sickle-cell trait: Secondary | ICD-10-CM

## 2020-08-30 DIAGNOSIS — M810 Age-related osteoporosis without current pathological fracture: Secondary | ICD-10-CM

## 2020-08-30 DIAGNOSIS — E538 Deficiency of other specified B group vitamins: Secondary | ICD-10-CM | POA: Diagnosis not present

## 2020-08-30 DIAGNOSIS — M25551 Pain in right hip: Secondary | ICD-10-CM

## 2020-08-30 DIAGNOSIS — K529 Noninfective gastroenteritis and colitis, unspecified: Secondary | ICD-10-CM | POA: Diagnosis not present

## 2020-08-30 DIAGNOSIS — E46 Unspecified protein-calorie malnutrition: Secondary | ICD-10-CM | POA: Diagnosis not present

## 2020-08-30 DIAGNOSIS — Z5181 Encounter for therapeutic drug level monitoring: Secondary | ICD-10-CM

## 2020-08-30 DIAGNOSIS — R634 Abnormal weight loss: Secondary | ICD-10-CM

## 2020-08-30 MED ORDER — ALENDRONATE-CHOLECALCIFEROL 70-2800 MG-UNIT PO TABS: 1.0000 | ORAL_TABLET | ORAL | 3 refills | Status: DC

## 2020-09-04 IMAGING — CR DG HIP (WITH OR WITHOUT PELVIS) 2-3V*R*
1 series · 3 of 3 positions shown · non-contrast
Comparison: None.

CLINICAL DATA: Lifting things around the house and felt a pop in
her thigh and groin.

EXAM:
DG HIP (WITH OR WITHOUT PELVIS) 2-3V RIGHT

[Series 1: dg hip unilat w or w/o pelvis 2-3 views  · non-contrast · 0.14mm/px · 3 of 3 slices shown]
[im 1/3]
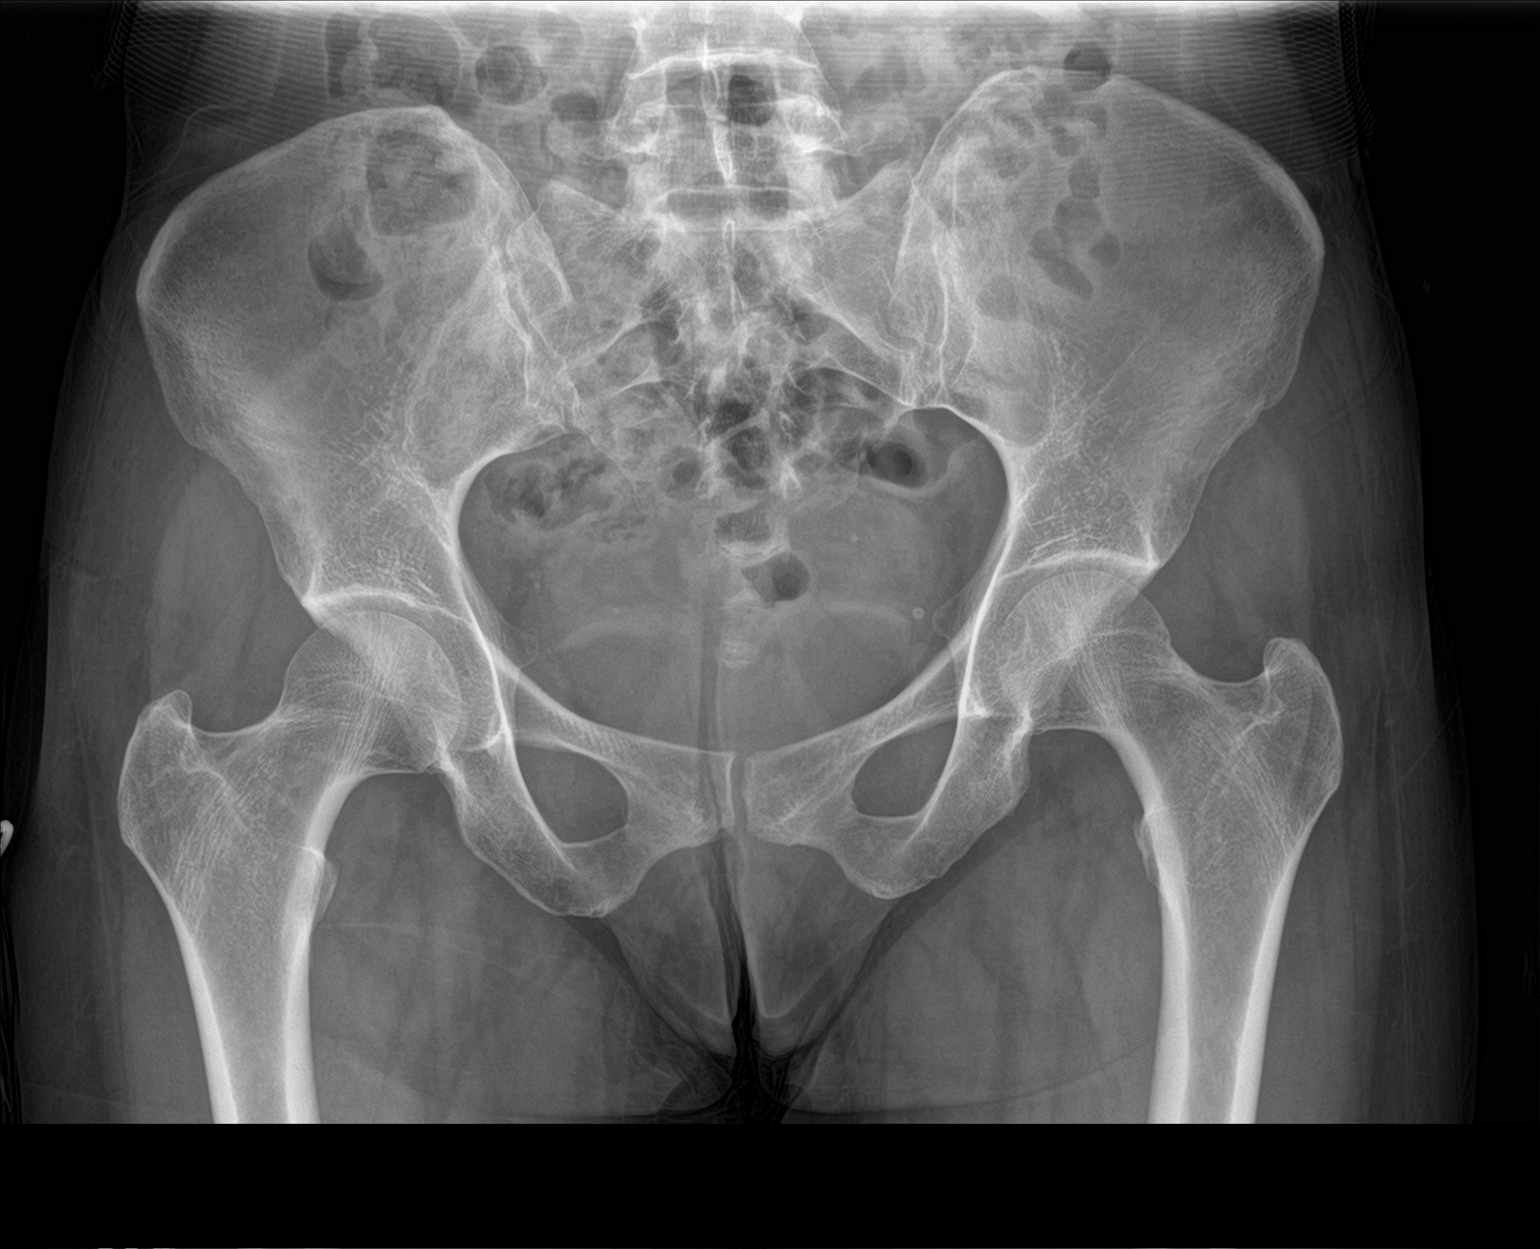
[im 2/3]
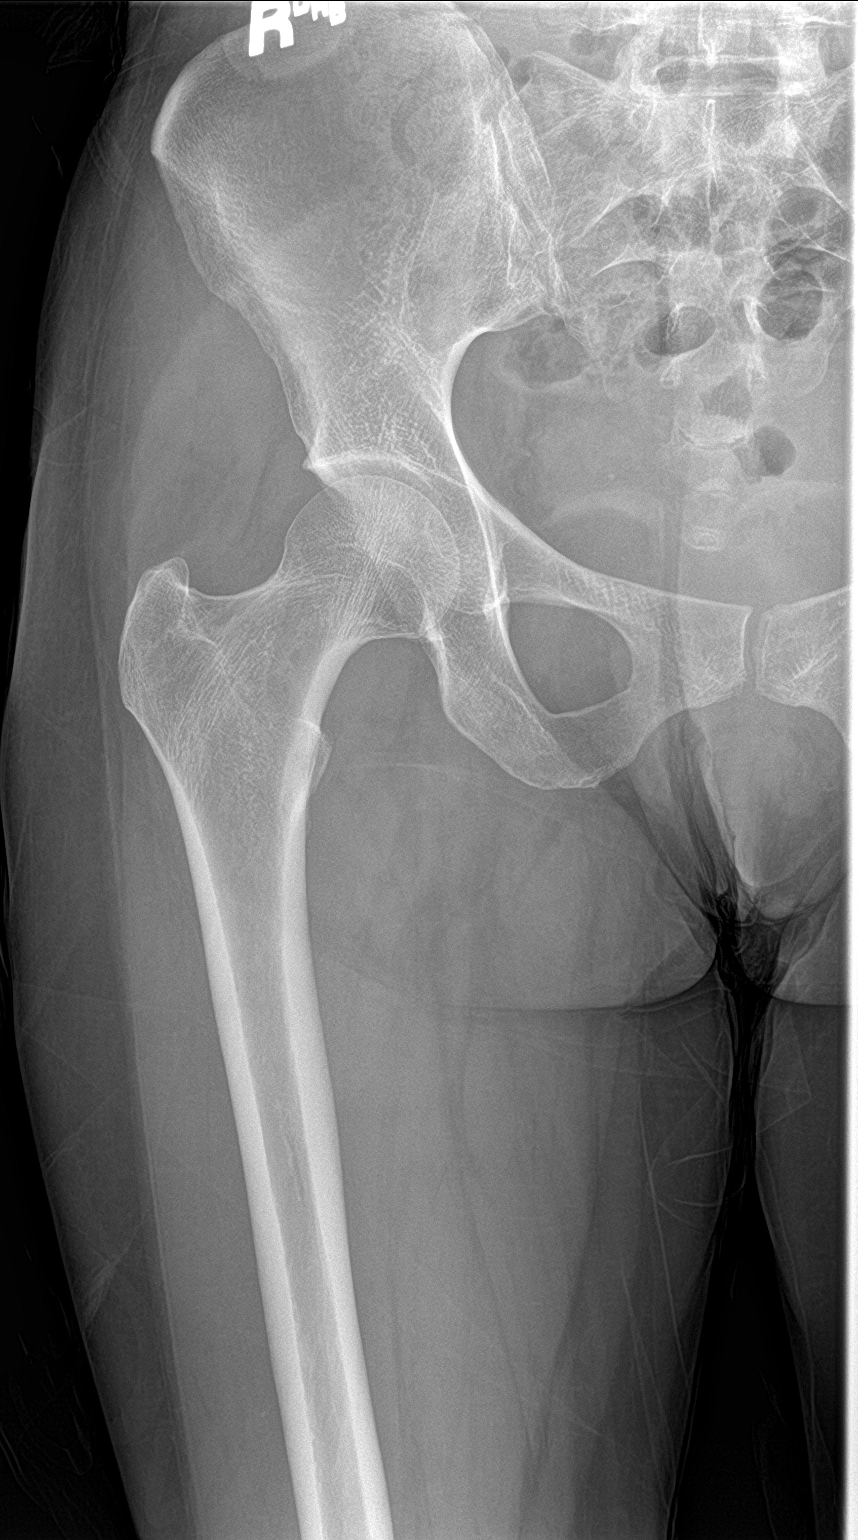
[im 3/3]
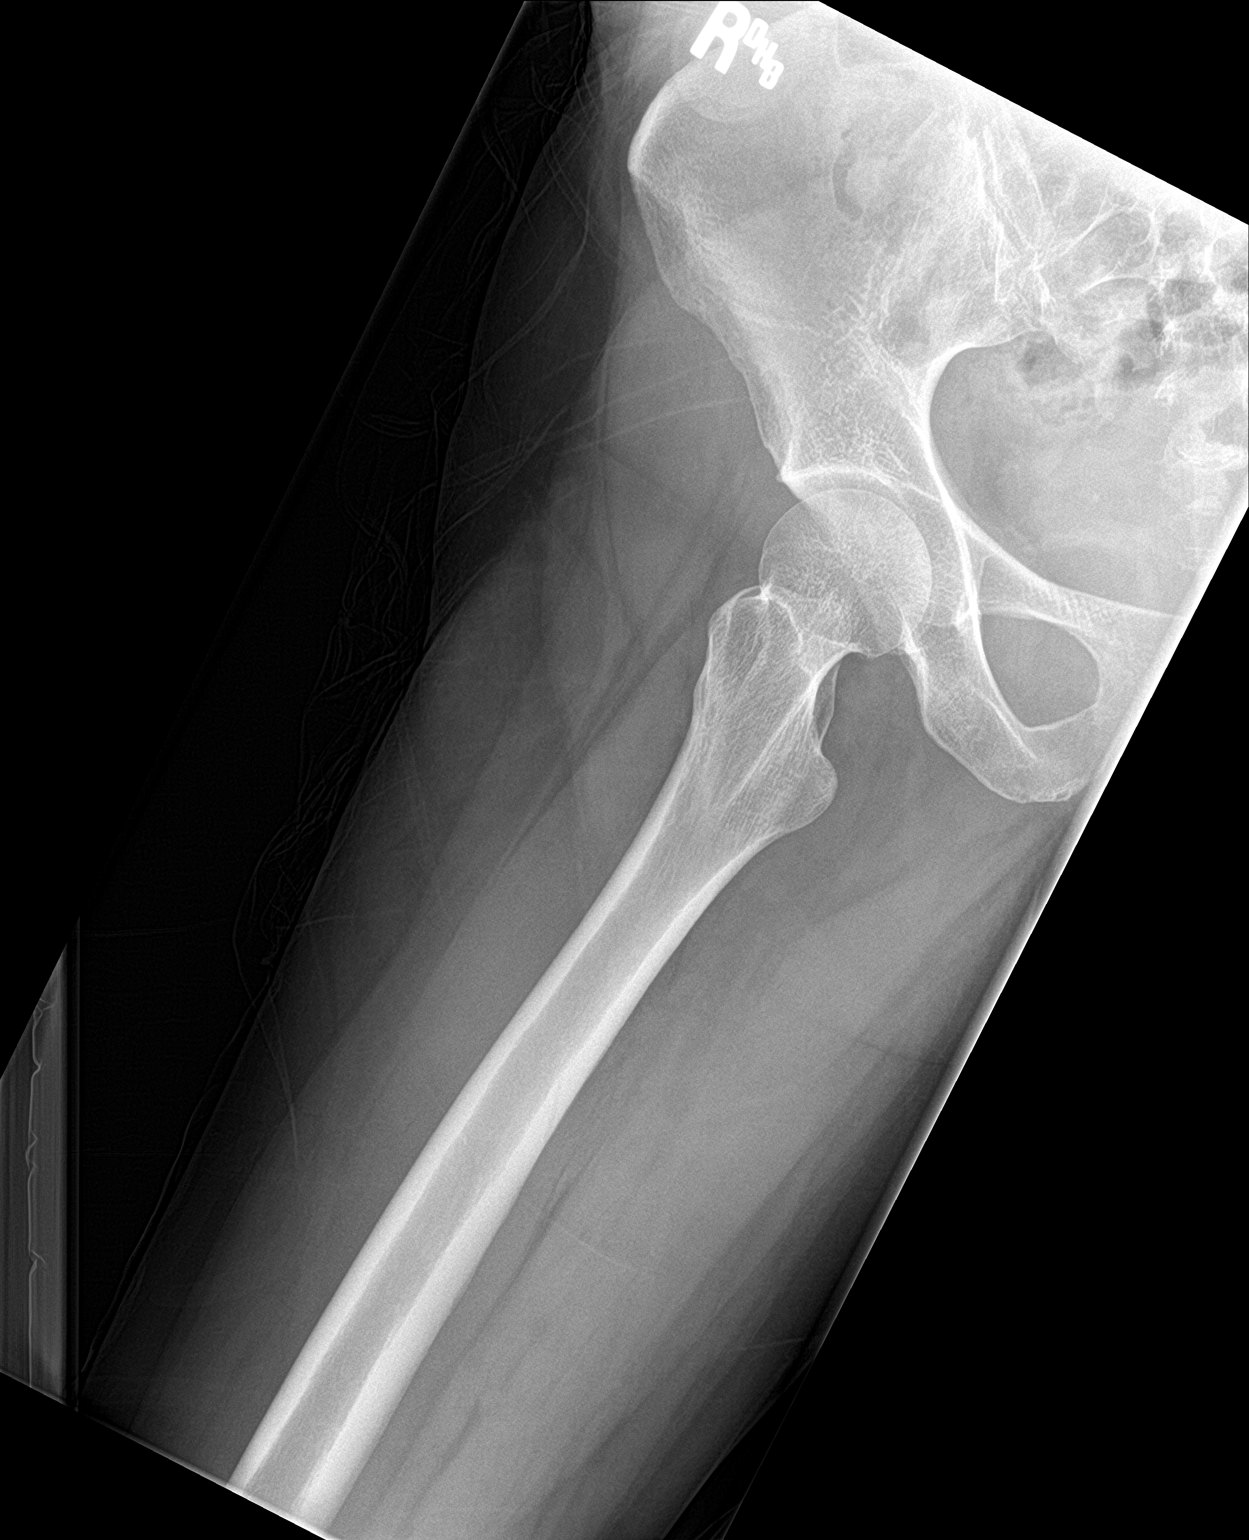

[3 of 3 positions shown; findings below may reference images not displayed]

FINDINGS: There is no evidence of hip fracture or dislocation. There is no
evidence of arthropathy or other focal bone abnormality.
IMPRESSION: Negative.

## 2020-09-19 ENCOUNTER — Other Ambulatory Visit: Payer: Self-pay | Admitting: Family Medicine

## 2020-09-19 DIAGNOSIS — Z1231 Encounter for screening mammogram for malignant neoplasm of breast: Secondary | ICD-10-CM

## 2020-09-28 IMAGING — MG MM DIGITAL SCREENING BILAT W/ TOMO W/ CAD
6 of 10 series · 6 of 30 positions shown · non-contrast
Comparison: Previous exam(s).

CLINICAL DATA: Screening.

EXAM:
DIGITAL SCREENING BILATERAL MAMMOGRAM WITH TOMO AND CAD

[L MLO synth-2D]
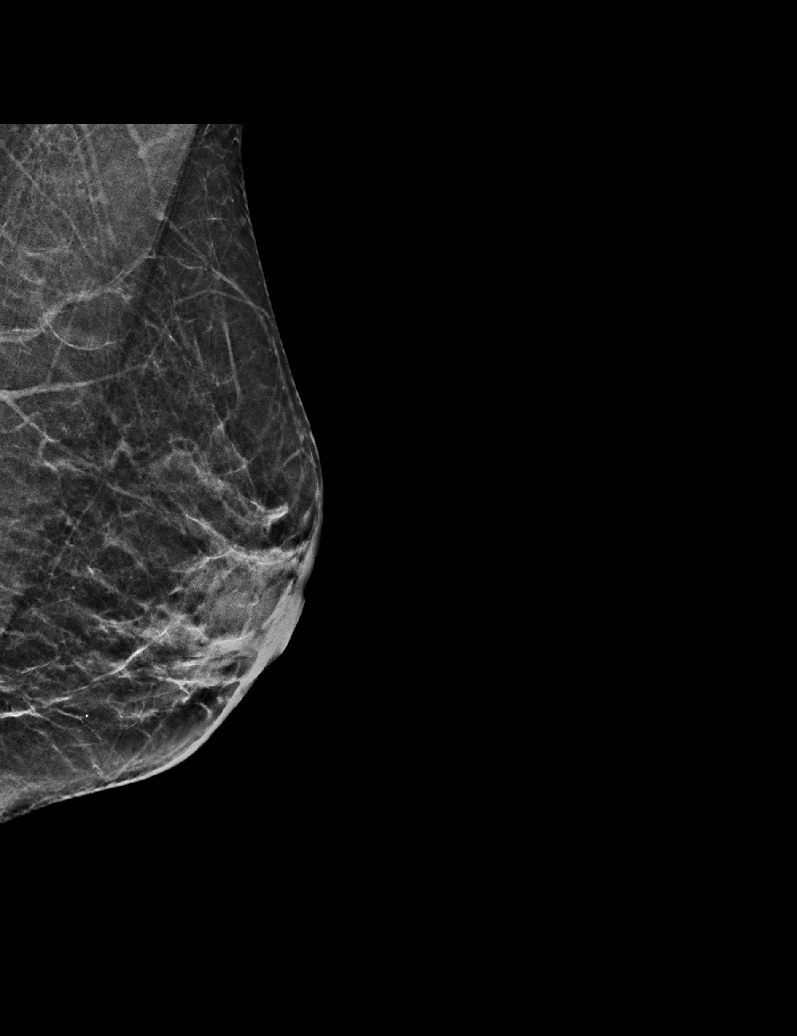

[L CC synth-2D]
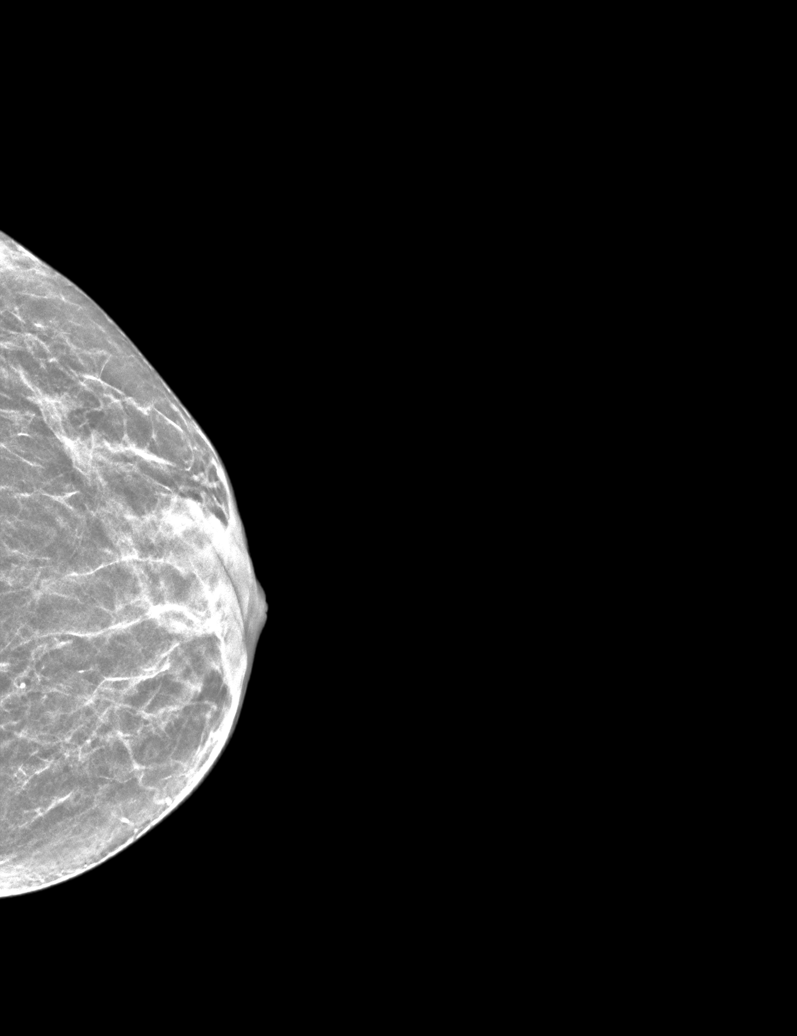

[R CC synth-2D]
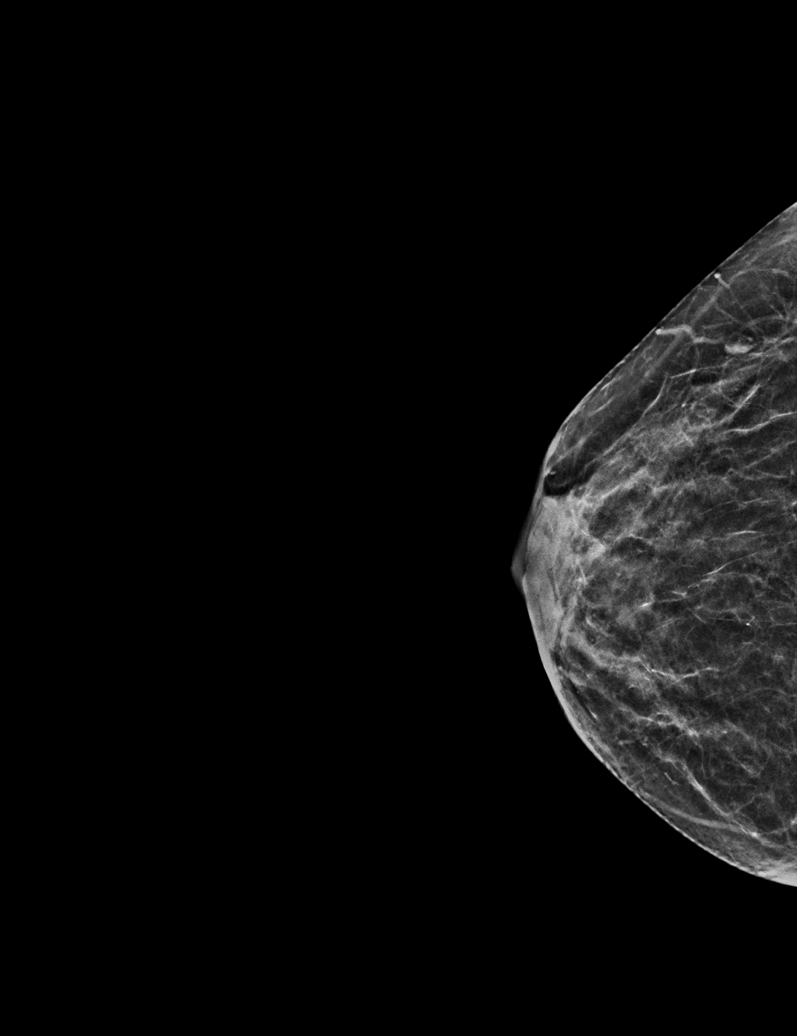

[R MLO synth-2D (1 of 2)]
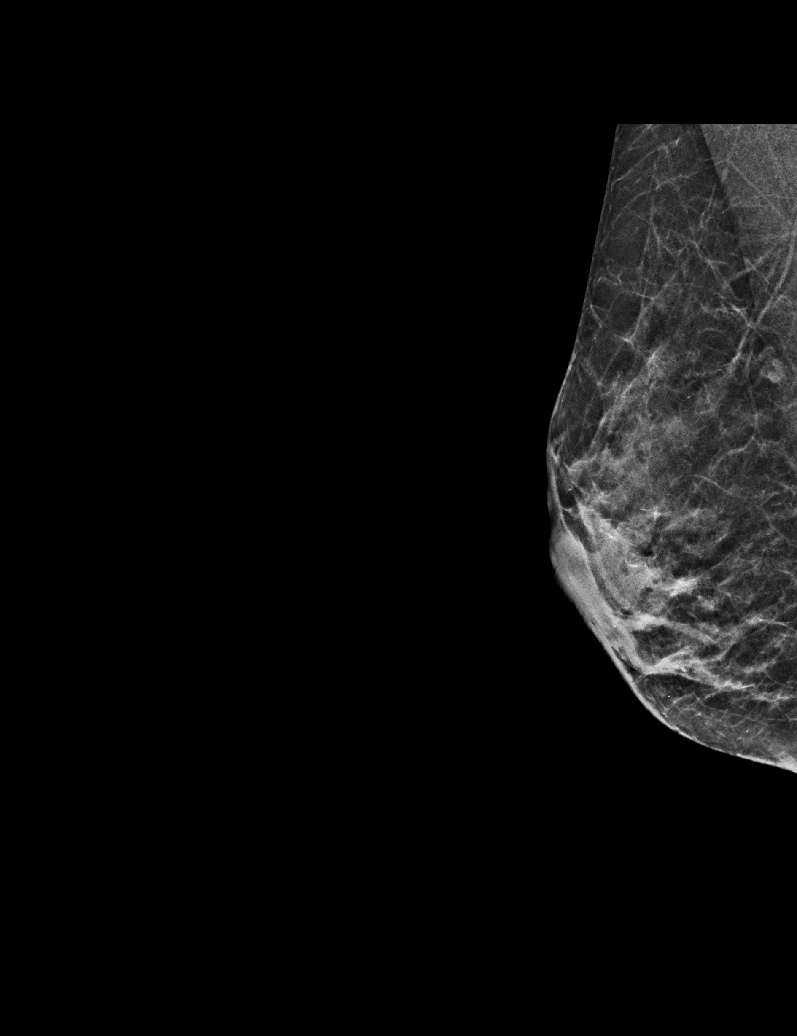

[R MLO synth-2D (2 of 2)]
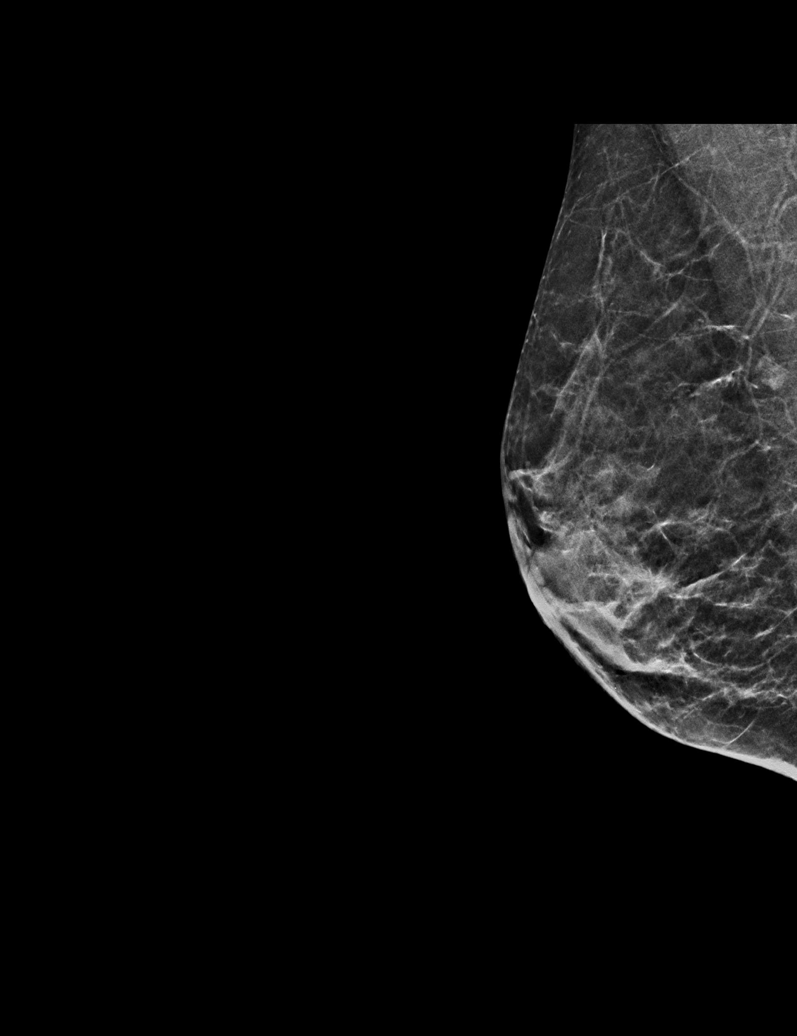

[R MLO tomo · tomo slice 19/37.0]
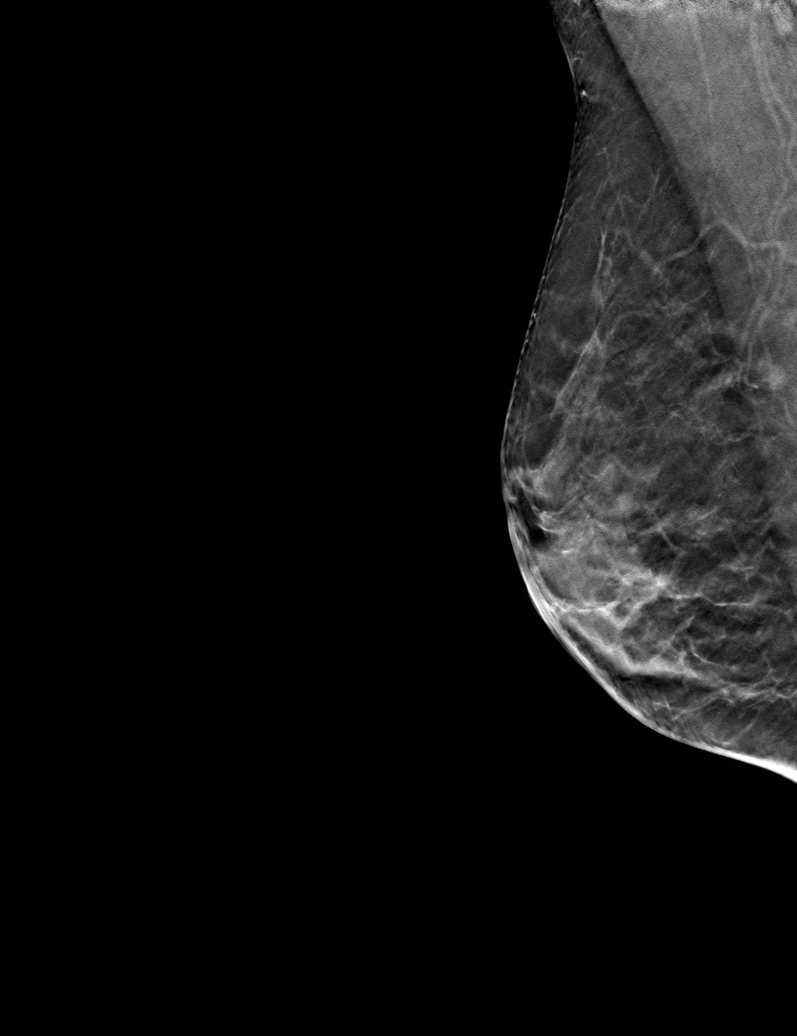

[6 of 30 positions shown; findings below may reference images not displayed]

ACR Breast Density Category c: The breast tissue is heterogeneously
dense, which may obscure small masses.
FINDINGS: There are no findings suspicious for malignancy. Images were
processed with CAD.
IMPRESSION: No mammographic evidence of malignancy. A result letter of this
screening mammogram will be mailed directly to the patient.

RECOMMENDATION:
Screening mammogram in one year. (Code:[id number])

BI-RADS CATEGORY  1: Negative.

## 2020-10-01 ENCOUNTER — Encounter: Payer: Self-pay | Admitting: Family Medicine

## 2020-10-01 ENCOUNTER — Other Ambulatory Visit: Payer: Self-pay

## 2020-10-01 ENCOUNTER — Ambulatory Visit (INDEPENDENT_AMBULATORY_CARE_PROVIDER_SITE_OTHER): Payer: BC Managed Care – PPO

## 2020-10-01 DIAGNOSIS — E538 Deficiency of other specified B group vitamins: Secondary | ICD-10-CM | POA: Diagnosis not present

## 2020-10-03 ENCOUNTER — Other Ambulatory Visit: Payer: Self-pay | Admitting: Podiatry

## 2020-10-07 ENCOUNTER — Other Ambulatory Visit: Payer: Self-pay

## 2020-10-07 ENCOUNTER — Ambulatory Visit
Admission: RE | Admit: 2020-10-07 | Discharge: 2020-10-07 | Disposition: A | Discharge status: Home / Self Care | Payer: BC Managed Care – PPO | Source: Ambulatory Visit | Attending: Family Medicine | Admitting: Family Medicine

## 2020-10-07 DIAGNOSIS — Z1231 Encounter for screening mammogram for malignant neoplasm of breast: Secondary | ICD-10-CM | POA: Diagnosis present

## 2020-10-09 ENCOUNTER — Other Ambulatory Visit: Payer: Self-pay

## 2020-10-09 ENCOUNTER — Encounter: Payer: Self-pay | Admitting: Podiatry

## 2020-10-14 ENCOUNTER — Other Ambulatory Visit: Payer: Self-pay

## 2020-10-14 ENCOUNTER — Other Ambulatory Visit
Admission: RE | Admit: 2020-10-14 | Discharge: 2020-10-14 | Disposition: A | Discharge status: Home / Self Care | Payer: BC Managed Care – PPO | Source: Ambulatory Visit | Attending: Podiatry | Admitting: Podiatry

## 2020-10-14 DIAGNOSIS — D573 Sickle-cell trait: Secondary | ICD-10-CM | POA: Diagnosis not present

## 2020-10-14 DIAGNOSIS — M2012 Hallux valgus (acquired), left foot: Secondary | ICD-10-CM | POA: Diagnosis not present

## 2020-10-14 DIAGNOSIS — Z01812 Encounter for preprocedural laboratory examination: Secondary | ICD-10-CM | POA: Insufficient documentation

## 2020-10-14 DIAGNOSIS — M2011 Hallux valgus (acquired), right foot: Secondary | ICD-10-CM | POA: Diagnosis not present

## 2020-10-14 DIAGNOSIS — Z20822 Contact with and (suspected) exposure to covid-19: Secondary | ICD-10-CM | POA: Insufficient documentation

## 2020-10-14 DIAGNOSIS — Z7983 Long term (current) use of bisphosphonates: Secondary | ICD-10-CM | POA: Diagnosis not present

## 2020-10-15 LAB — SARS CORONAVIRUS 2 (TAT 6-24 HRS): SARS Coronavirus 2: NEGATIVE

## 2020-10-16 ENCOUNTER — Encounter: Payer: Self-pay | Admitting: Podiatry

## 2020-10-16 ENCOUNTER — Ambulatory Visit
Admission: RE | Admit: 2020-10-16 | Discharge: 2020-10-16 | Disposition: A | Discharge status: Home / Self Care | Payer: BC Managed Care – PPO | Attending: Podiatry | Admitting: Podiatry

## 2020-10-16 ENCOUNTER — Ambulatory Visit: Payer: BC Managed Care – PPO | Admitting: Anesthesiology

## 2020-10-16 ENCOUNTER — Other Ambulatory Visit: Payer: Self-pay

## 2020-10-16 ENCOUNTER — Encounter
Admission: RE | Disposition: A | Discharge status: Home / Self Care | Payer: Self-pay | Source: Home / Self Care | Attending: Podiatry

## 2020-10-16 DIAGNOSIS — Z20822 Contact with and (suspected) exposure to covid-19: Secondary | ICD-10-CM | POA: Insufficient documentation

## 2020-10-16 DIAGNOSIS — M2011 Hallux valgus (acquired), right foot: Secondary | ICD-10-CM | POA: Insufficient documentation

## 2020-10-16 DIAGNOSIS — M2012 Hallux valgus (acquired), left foot: Secondary | ICD-10-CM | POA: Insufficient documentation

## 2020-10-16 DIAGNOSIS — Z7983 Long term (current) use of bisphosphonates: Secondary | ICD-10-CM | POA: Insufficient documentation

## 2020-10-16 DIAGNOSIS — D573 Sickle-cell trait: Secondary | ICD-10-CM | POA: Insufficient documentation

## 2020-10-16 HISTORY — DX: Family history of other specified conditions: Z84.89

## 2020-10-16 HISTORY — PX: HALLUX VALGUS AUSTIN: SHX6623

## 2020-10-16 SURGERY — CORRECTION, HALLUX VALGUS
Anesthesia: General | Site: Foot | Laterality: Right

## 2020-10-16 MED ORDER — FENTANYL CITRATE (PF) 100 MCG/2ML IJ SOLN: 25.0000 ug | INTRAMUSCULAR | Status: DC | PRN

## 2020-10-16 MED ORDER — MIDAZOLAM HCL 5 MG/5ML IJ SOLN
INTRAMUSCULAR | Status: DC | PRN
  Administered 2020-10-16: 2 mg via INTRAVENOUS

## 2020-10-16 MED ORDER — OXYCODONE HCL 5 MG/5ML PO SOLN: 5.0000 mg | Freq: Once | ORAL | Status: DC | PRN

## 2020-10-16 MED ORDER — LIDOCAINE HCL (CARDIAC) PF 100 MG/5ML IV SOSY
PREFILLED_SYRINGE | INTRAVENOUS | Status: DC | PRN
  Administered 2020-10-16: 50 mg via INTRATRACHEAL

## 2020-10-16 MED ORDER — PROPOFOL 10 MG/ML IV BOLUS
INTRAVENOUS | Status: DC | PRN
  Administered 2020-10-16: 80 mg via INTRAVENOUS

## 2020-10-16 MED ORDER — ONDANSETRON HCL 4 MG/2ML IJ SOLN
INTRAMUSCULAR | Status: DC | PRN
  Administered 2020-10-16: 4 mg via INTRAVENOUS

## 2020-10-16 MED ORDER — OXYCODONE-ACETAMINOPHEN 5-325 MG PO TABS
1.0000 | ORAL_TABLET | Freq: Four times a day (QID) | ORAL | 0 refills | Status: DC | PRN
Start: 2020-10-16 — End: 2020-11-27

## 2020-10-16 MED ORDER — BUPIVACAINE HCL (PF) 0.25 % IJ SOLN
INTRAMUSCULAR | Status: DC | PRN
  Administered 2020-10-16: 10 mL

## 2020-10-16 MED ORDER — BUPIVACAINE LIPOSOME 1.3 % IJ SUSP
INTRAMUSCULAR | Status: DC | PRN
  Administered 2020-10-16 (×2): 10 mL

## 2020-10-16 MED ORDER — ONDANSETRON HCL 4 MG/2ML IJ SOLN: 4.0000 mg | Freq: Once | INTRAMUSCULAR | Status: DC | PRN

## 2020-10-16 MED ORDER — ONDANSETRON HCL 4 MG/2ML IJ SOLN: 4.0000 mg | Freq: Four times a day (QID) | INTRAMUSCULAR | Status: DC | PRN

## 2020-10-16 MED ORDER — OXYCODONE HCL 5 MG PO TABS: 5.0000 mg | ORAL_TABLET | Freq: Once | ORAL | Status: DC | PRN

## 2020-10-16 MED ORDER — FENTANYL CITRATE (PF) 100 MCG/2ML IJ SOLN
INTRAMUSCULAR | Status: DC | PRN
  Administered 2020-10-16: 25 ug via INTRAVENOUS
  Administered 2020-10-16: 12.5 ug via INTRAVENOUS

## 2020-10-16 MED ORDER — SCOPOLAMINE 1 MG/3DAYS TD PT72
1.0000 | MEDICATED_PATCH | Freq: Once | TRANSDERMAL | Status: DC
  Administered 2020-10-16: 1.5 mg via TRANSDERMAL

## 2020-10-16 MED ORDER — CLINDAMYCIN PHOSPHATE 900 MG/50ML IV SOLN: 900.0000 mg | INTRAVENOUS | Status: DC

## 2020-10-16 MED ORDER — ACETAMINOPHEN 160 MG/5ML PO SOLN: 325.0000 mg | ORAL | Status: DC | PRN

## 2020-10-16 MED ORDER — LACTATED RINGERS IV SOLN: INTRAVENOUS | Status: DC

## 2020-10-16 MED ORDER — DEXAMETHASONE SODIUM PHOSPHATE 4 MG/ML IJ SOLN
INTRAMUSCULAR | Status: DC | PRN
  Administered 2020-10-16: 4 mg via INTRAVENOUS

## 2020-10-16 MED ORDER — ONDANSETRON HCL 4 MG PO TABS: 4.0000 mg | ORAL_TABLET | Freq: Four times a day (QID) | ORAL | Status: DC | PRN

## 2020-10-16 MED ORDER — GLYCOPYRROLATE 0.2 MG/ML IJ SOLN
INTRAMUSCULAR | Status: DC | PRN
  Administered 2020-10-16: .1 mg via INTRAVENOUS

## 2020-10-16 MED ORDER — ACETAMINOPHEN 325 MG PO TABS: 325.0000 mg | ORAL_TABLET | ORAL | Status: DC | PRN

## 2020-10-16 MED ORDER — POVIDONE-IODINE 10 % EX SWAB: 2.0000 "application " | Freq: Once | CUTANEOUS | Status: DC

## 2020-10-16 SURGICAL SUPPLY — 44 items
APL SKNCLS STERI-STRIP NONHPOA (GAUZE/BANDAGES/DRESSINGS) ×1
BENZOIN TINCTURE PRP APPL 2/3 (GAUZE/BANDAGES/DRESSINGS) ×2 IMPLANT
BIT DRILL CANNULATED 2.0 (BIT) ×1 IMPLANT
BLADE MED AGGRESSIVE (BLADE) ×1 IMPLANT
BNDG CMPR 75X41 PLY HI ABS (GAUZE/BANDAGES/DRESSINGS) ×1
BNDG COHESIVE 4X5 TAN STRL (GAUZE/BANDAGES/DRESSINGS) ×3 IMPLANT
BNDG ELASTIC 4X5.8 VLCR STR LF (GAUZE/BANDAGES/DRESSINGS) ×2 IMPLANT
BNDG ESMARK 4X12 TAN STRL LF (GAUZE/BANDAGES/DRESSINGS) ×2 IMPLANT
BNDG GAUZE 4.5X4.1 6PLY STRL (MISCELLANEOUS) ×2 IMPLANT
BNDG STRETCH 4X75 STRL LF (GAUZE/BANDAGES/DRESSINGS) ×2 IMPLANT
CNTRSNK DRL 2.5X2.5X (MISCELLANEOUS) IMPLANT
COUNTERSINK 2.5 (MISCELLANEOUS) ×2
COVER LIGHT HANDLE UNIVERSAL (MISCELLANEOUS) ×4 IMPLANT
CUFF TOURN SGL QUICK 18X4 (TOURNIQUET CUFF) ×1 IMPLANT
DRAPE FLUOR MINI C-ARM 54X84 (DRAPES) ×2 IMPLANT
DURAPREP 26ML APPLICATOR (WOUND CARE) ×2 IMPLANT
ELECT REM PT RETURN 9FT ADLT (ELECTROSURGICAL) ×2
ELECTRODE REM PT RTRN 9FT ADLT (ELECTROSURGICAL) ×1 IMPLANT
GAUZE SPONGE 4X4 12PLY STRL (GAUZE/BANDAGES/DRESSINGS) ×2 IMPLANT
GAUZE XEROFORM 1X8 LF (GAUZE/BANDAGES/DRESSINGS) ×2 IMPLANT
GLOVE BIO SURGEON STRL SZ7.5 (GLOVE) ×2 IMPLANT
GLOVE INDICATOR 8.0 STRL GRN (GLOVE) ×2 IMPLANT
GOWN STRL REUS W/ TWL LRG LVL3 (GOWN DISPOSABLE) ×2 IMPLANT
GOWN STRL REUS W/TWL LRG LVL3 (GOWN DISPOSABLE) ×4
GUIDEWIRE .9 (WIRE) ×2 IMPLANT
K-WIRE DBL END TROCAR 6X.045 (WIRE) ×2
K-WIRE DBL END TROCAR 6X.062 (WIRE) ×2
KIT TURNOVER KIT A (KITS) ×2 IMPLANT
KWIRE DBL END TROCAR 6X.045 (WIRE) IMPLANT
KWIRE DBL END TROCAR 6X.062 (WIRE) IMPLANT
NS IRRIG 500ML POUR BTL (IV SOLUTION) ×2 IMPLANT
PACK EXTREMITY ARMC (MISCELLANEOUS) ×2 IMPLANT
PENCIL SMOKE EVACUATOR (MISCELLANEOUS) ×2 IMPLANT
RASP SM TEAR CROSS CUT (RASP) ×1 IMPLANT
SCREW 2.5X22 HEADLESS (Screw) ×1 IMPLANT
STOCKINETTE IMPERVIOUS LG (DRAPES) ×2 IMPLANT
STRIP CLOSURE SKIN 1/4X4 (GAUZE/BANDAGES/DRESSINGS) ×2 IMPLANT
SUT MNCRL 4-0 (SUTURE) ×2
SUT MNCRL 4-0 27XMFL (SUTURE) ×1
SUT VIC AB 3-0 SH 27 (SUTURE) ×2
SUT VIC AB 3-0 SH 27X BRD (SUTURE) IMPLANT
SUT VIC AB 4-0 SH 27 (SUTURE) ×2
SUT VIC AB 4-0 SH 27XANBCTRL (SUTURE) IMPLANT
SUTURE MNCRL 4-0 27XMF (SUTURE) IMPLANT

## 2020-10-16 NOTE — Anesthesia Procedure Notes (Signed)
Procedure Name: LMA Insertion Date/Time: 10/16/2020 1:47 PM Performed by: Mayme Genta, CRNA Pre-anesthesia Checklist: Patient identified, Emergency Drugs available, Suction available, Timeout performed and Patient being monitored Patient Re-evaluated:Patient Re-evaluated prior to induction Oxygen Delivery Method: Circle system utilized Preoxygenation: Pre-oxygenation with 100% oxygen Induction Type: IV induction LMA: LMA inserted LMA Size: 4.0 Number of attempts: 1 Placement Confirmation: positive ETCO2 and breath sounds checked- equal and bilateral Tube secured with: Tape

## 2020-10-16 NOTE — Discharge Instructions (Signed)
[facility]  POST OPERATIVE INSTRUCTIONS FOR DR. TROXLER, DR. FOWLER, AND DR. [facility] PODIATRY [facility]PART[facility]   1. Take your medication as prescribed.  Pain medication should be taken only as needed.  2. Keep the dressing clean, dry and intact.  3. Keep your foot elevated above the heart level for the first 48 hours.  4. Walking to the bathroom and brief periods of walking are acceptable, unless we have instructed you to be non-weight bearing.  5. Always wear your post-op shoe when walking.  Always use your crutches if you are to be non-weight bearing.  6. Do not take a shower. Baths are permissible as long as the foot is kept out of the water.   7. Every hour you are awake:  - Bend your knee 15 times. - Flex foot 15 times - Massage calf 15 times  8. Call [facility] ([phone]) if any of the following problems occur: - You develop a temperature or fever. - The bandage becomes saturated with blood. - Medication does not stop your pain. - Injury of the foot occurs. - Any symptoms of infection including redness, odor, or red streaks running from wound.  Information for Discharge Teaching: EXPAREL (bupivacaine liposome injectable suspension)   Your surgeon or anesthesiologist gave you EXPAREL(bupivacaine) to help control your pain after surgery.   EXPAREL is a local anesthetic that provides pain relief by numbing the tissue around the surgical site.  EXPAREL is designed to release pain medication over time and can control pain for up to 72 hours.  Depending on how you respond to EXPAREL, you may require less pain medication during your recovery.  Possible side effects:  Temporary loss of sensation or ability to move in the area where bupivacaine was injected.  Nausea, vomiting, constipation  Rarely, numbness and tingling in your mouth or lips, lightheadedness, or anxiety may occur.  Call your doctor right away  if you think you may be experiencing any of these sensations, or if you have other questions regarding possible side effects.  Follow all other discharge instructions given to you by your surgeon or nurse. Eat a healthy diet and drink plenty of water or other fluids.  If you return to the hospital for any reason within 96 hours following the administration of EXPAREL, it is important for health care providers to know that you have received this anesthetic. A teal colored band has been placed on your arm with the date, time and amount of EXPAREL you have received in order to alert and inform your health care providers. Please leave this armband in place for the full 96 hours following administration, and then you may remove the band.  General Anesthesia, Adult, Care After This sheet gives you information about how to care for yourself after your procedure. Your health care provider may also give you more specific instructions. If you have problems or questions, contact your health care provider. What can I expect after the procedure? After the procedure, the following side effects are common:  Pain or discomfort at the IV site.  Nausea.  Vomiting.  Sore throat.  Trouble concentrating.  Feeling cold or chills.  Weak or tired.  Sleepiness and fatigue.  Soreness and body aches. These side effects can affect parts of the body that were not involved in surgery. Follow these instructions at home:  For at least 24 hours after the procedure:  Have a responsible adult stay with you. It is important to   have someone help care for you until you are awake and alert.  Rest as needed.  Do not: ? Participate in activities in which you could fall or become injured. ? Drive. ? Use heavy machinery. ? Drink alcohol. ? Take sleeping pills or medicines that cause drowsiness. ? Make important decisions or sign legal documents. ? Take care of children on your own. Eating and drinking  Follow any  instructions from your health care provider about eating or drinking restrictions.  When you feel hungry, start by eating small amounts of foods that are soft and easy to digest (bland), such as toast. Gradually return to your regular diet.  Drink enough fluid to keep your urine pale yellow.  If you vomit, rehydrate by drinking water, juice, or clear broth. General instructions  If you have sleep apnea, surgery and certain medicines can increase your risk for breathing problems. Follow instructions from your health care provider about wearing your sleep device: ? Anytime you are sleeping, including during daytime naps. ? While taking prescription pain medicines, sleeping medicines, or medicines that make you drowsy.  Return to your normal activities as told by your health care provider. Ask your health care provider what activities are safe for you.  Take over-the-counter and prescription medicines only as told by your health care provider.  If you smoke, do not smoke without supervision.  Keep all follow-up visits as told by your health care provider. This is important. Contact a health care provider if:  You have nausea or vomiting that does not get better with medicine.  You cannot eat or drink without vomiting.  You have pain that does not get better with medicine.  You are unable to pass urine.  You develop a skin rash.  You have a fever.  You have redness around your IV site that gets worse. Get help right away if:  You have difficulty breathing.  You have chest pain.  You have blood in your urine or stool, or you vomit blood. Summary  After the procedure, it is common to have a sore throat or nausea. It is also common to feel tired.  Have a responsible adult stay with you for the first 24 hours after general anesthesia. It is important to have someone help care for you until you are awake and alert.  When you feel hungry, start by eating small amounts of foods  that are soft and easy to digest (bland), such as toast. Gradually return to your regular diet.  Drink enough fluid to keep your urine pale yellow.  Return to your normal activities as told by your health care provider. Ask your health care provider what activities are safe for you. This information is not intended to replace advice given to you by your health care provider. Make sure you discuss any questions you have with your health care provider. Document Revised: 11/19/2017 Document Reviewed: 07/02/2017 Elsevier Patient Education  2020 [employer] Inc[employer]  

## 2020-10-16 NOTE — Anesthesia Postprocedure Evaluation (Signed)
Anesthesia Post Note  Patient: Angela Gordon  Procedure(s) Performed: DOUBLE OSTEOTOMY RIGHT (Right Foot)     Patient location during evaluation: PACU Anesthesia Type: General Level of consciousness: awake and alert and oriented Pain management: satisfactory to patient Vital Signs Assessment: post-procedure vital signs reviewed and stable Respiratory status: spontaneous breathing, nonlabored ventilation and respiratory function stable Cardiovascular status: blood pressure returned to baseline and stable Postop Assessment: Adequate PO intake and No signs of nausea or vomiting Anesthetic complications: no   No complications documented.  Raliegh Ip

## 2020-10-16 NOTE — H&P (Signed)
HISTORY AND PHYSICAL INTERVAL NOTE:  10/16/2020  1:29 PM  Angela Gordon  has presented today for surgery, with the diagnosis of M20.11, M20.12  HALLUX VALGUS BILATERAL.  The various methods of treatment have been discussed with the patient.  No guarantees were given.  After consideration of risks, benefits and other options for treatment, the patient has consented to surgery.  I have reviewed the patients' chart and labs.     A history and physical examination was performed in my office.  The patient was reexamined.  There have been no changes to this history and physical examination.  Angela Gordon A

## 2020-10-16 NOTE — Transfer of Care (Signed)
Immediate Anesthesia Transfer of Care Note  Patient: Angela Gordon  Procedure(s) Performed: DOUBLE OSTEOTOMY RIGHT (Right Foot)  Patient Location: PACU  Anesthesia Type: General LMA  Level of Consciousness: awake, alert  and patient cooperative  Airway and Oxygen Therapy: Patient Spontanous Breathing and Patient connected to supplemental oxygen  Post-op Assessment: Post-op Vital signs reviewed, Patient's Cardiovascular Status Stable, Respiratory Function Stable, Patent Airway and No signs of Nausea or vomiting  Post-op Vital Signs: Reviewed and stable  Complications: No complications documented.

## 2020-10-16 NOTE — Op Note (Signed)
Operative note   Surgeon:Shae Augello Lawyer: None    Preop diagnosis: Right foot hallux valgus    Postop diagnosis: Same    Procedure: [place] hallux valgus correction right foot    EBL: Minimal    Anesthesia:general with local    Hemostasis: Ankle tourniquet inflated to 200 mmHg for approximately 40 minutes    Specimen: None    Complications: None    Operative indications:Angela Gordon is an 49 y.o. that presents today for surgical intervention.  The risks/benefits/alternatives/complications have been discussed and consent has been given.    Procedure:  Patient was brought into the OR and placed on the operating table in thesupine position. After anesthesia was obtained theright lower extremity was prepped and draped in usual sterile fashion.  Attention was directed to the dorsomedial aspect of the right first MTPJ where incision was performed.  Sharp and blunt dissection was carried down to the capsule.  AT capsulotomy was performed.  The dorsomedial eminence was noted and transected with a power saw and smoothed with a power rasp.  Next a V osteotomy was created.  The capital fragment was translocated laterally.  This was stabilized with a 2.5 mm dorsal to plantar screw.  Stability was noted and excellent realignment was noted.  No residual valgus of the great toe was demonstrated at this time.  The ensuing overhanging ledge was then transected and smoothed.  The wound was flushed with copious amounts of irrigation.  Closure was then performed with a 3-0 Vicryl for the capsule, 4-0 Vicryl for the subcutaneous tissue and a 4-0 Monocryl for the skin.  All areas were infiltrated with Exparel long-acting anesthetic and supplemented with 0.25% bupivacaine plain.    Patient tolerated the procedure and anesthesia well.  Was transported from the OR to the PACU with all vital signs stable and vascular status intact. To be discharged per routine protocol.  Will follow up in  approximately 1 week in the outpatient clinic.  A prescription for Percocet was sent to her pharmacy.

## 2020-10-16 NOTE — Anesthesia Preprocedure Evaluation (Signed)
Anesthesia Evaluation  Patient identified by MRN, date of birth, ID band Patient awake    Reviewed: Allergy & Precautions, H&P , NPO status , Patient's Chart, lab work & pertinent test results  History of Anesthesia Complications (+) PONV and history of anesthetic complications  Airway Mallampati: II  TM Distance: >3 FB Neck ROM: full    Dental no notable dental hx.    Pulmonary asthma ,    Pulmonary exam normal breath sounds clear to auscultation       Cardiovascular Normal cardiovascular exam Rhythm:regular Rate:Normal     Neuro/Psych    GI/Hepatic   Endo/Other    Renal/GU      Musculoskeletal   Abdominal   Peds  Hematology  (+) Blood dyscrasia, Sickle cell trait ,   Anesthesia Other Findings   Reproductive/Obstetrics                             Anesthesia Physical Anesthesia Plan  ASA: II  Anesthesia Plan: General LMA   Post-op Pain Management:    Induction:   PONV Risk Score and Plan: 4 or greater and Treatment may vary due to age or medical condition, Ondansetron, Dexamethasone, Midazolam and Scopolamine patch - Pre-op  Airway Management Planned:   Additional Equipment:   Intra-op Plan:   Post-operative Plan:   Informed Consent: I have reviewed the patients History and Physical, chart, labs and discussed the procedure including the risks, benefits and alternatives for the proposed anesthesia with the patient or authorized representative who has indicated his/her understanding and acceptance.     Dental Advisory Given  Plan Discussed with: CRNA  Anesthesia Plan Comments:         Anesthesia Quick Evaluation

## 2020-10-17 ENCOUNTER — Encounter: Payer: Self-pay | Admitting: Podiatry

## 2020-10-19 ENCOUNTER — Other Ambulatory Visit: Payer: Self-pay | Admitting: Gastroenterology

## 2020-10-26 IMAGING — CR DG LUMBAR SPINE COMPLETE 4+V
1 series · 5 of 5 positions shown · non-contrast
Comparison: None.

CLINICAL DATA: Back pain

EXAM:
LUMBAR SPINE - COMPLETE 4+ VIEW

[Series 1: dg lumbar spine complete 4 +v · 0.14mm/px · 5 of 5 slices shown]
[im 1/5]
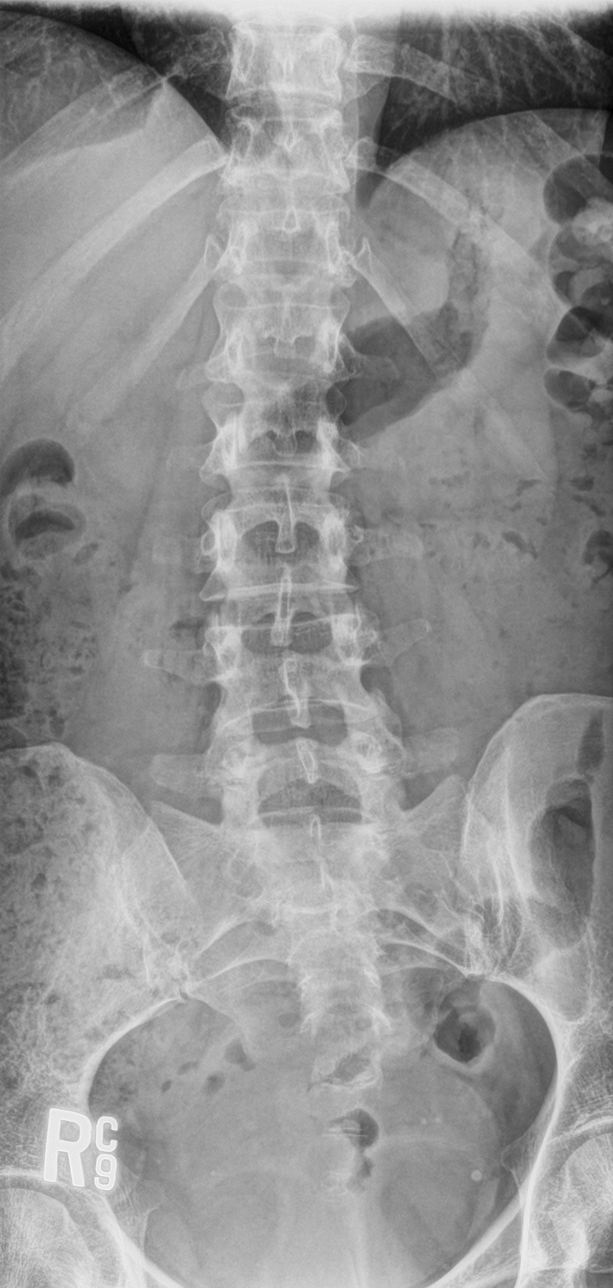
[im 2/5]
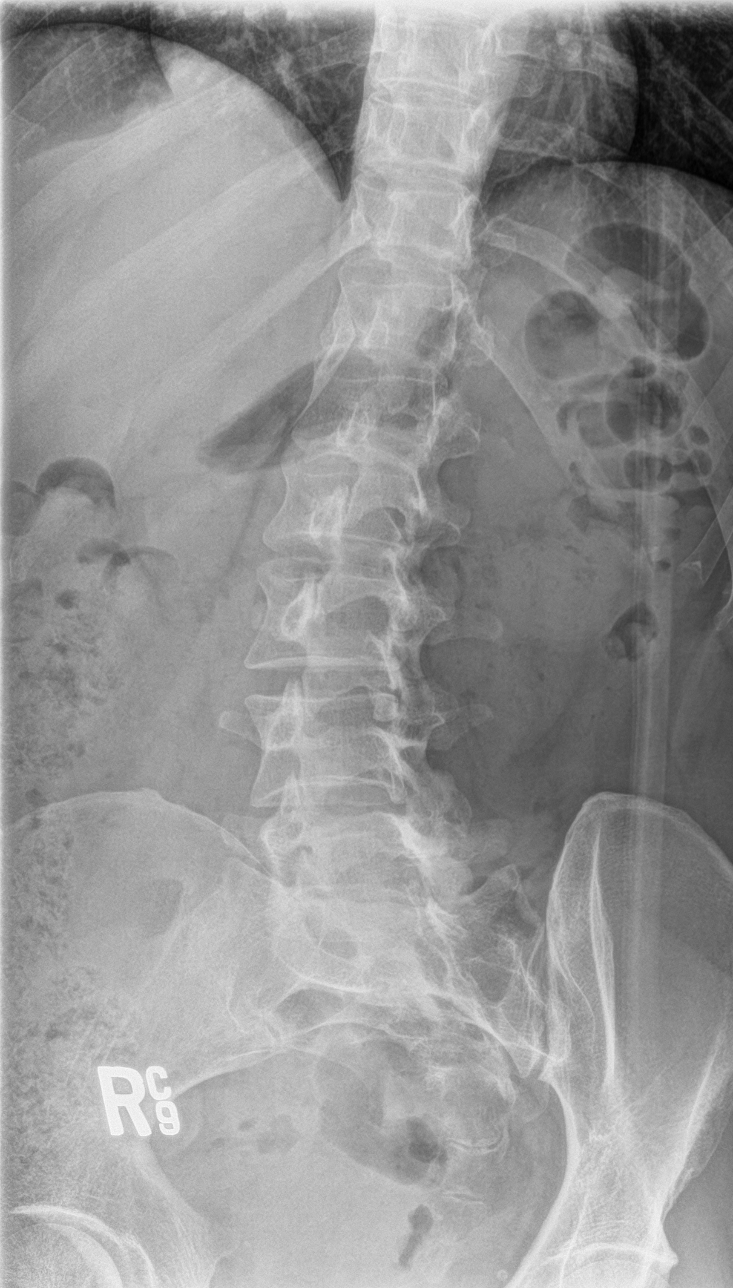
[im 3/5]
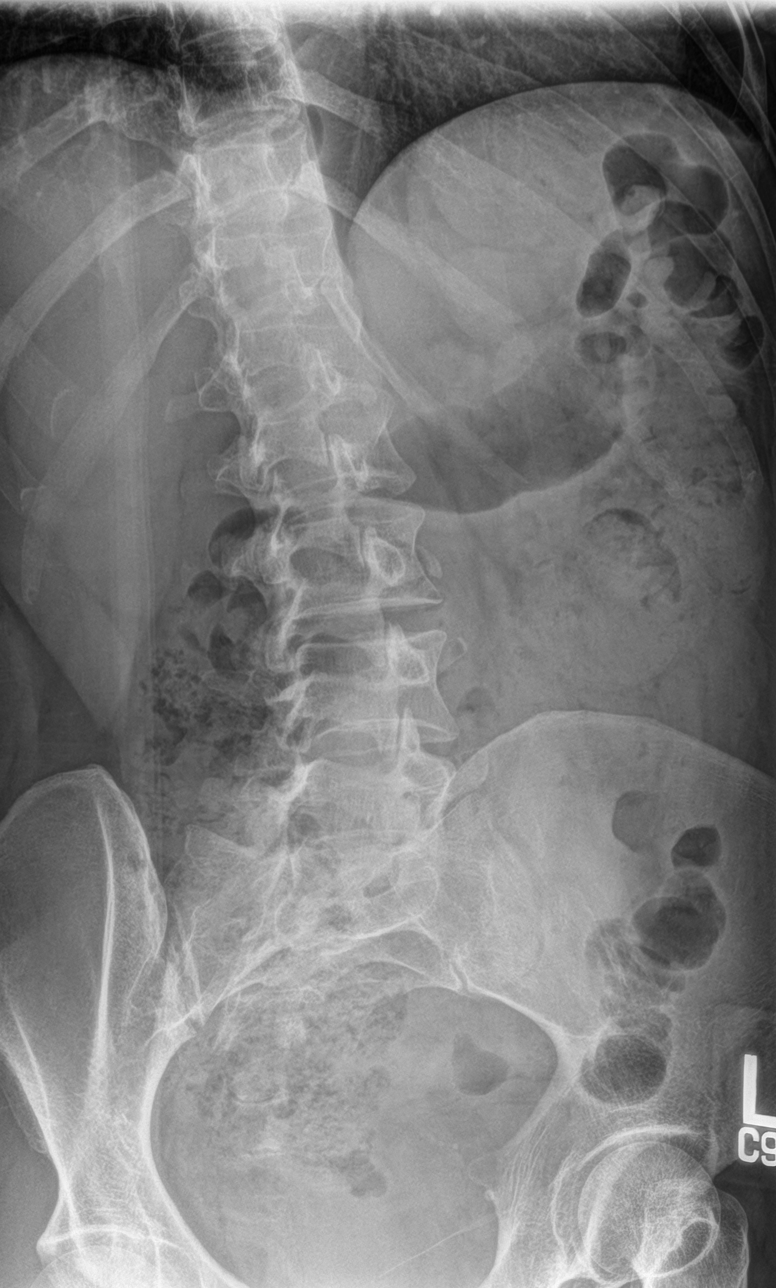
[im 4/5]
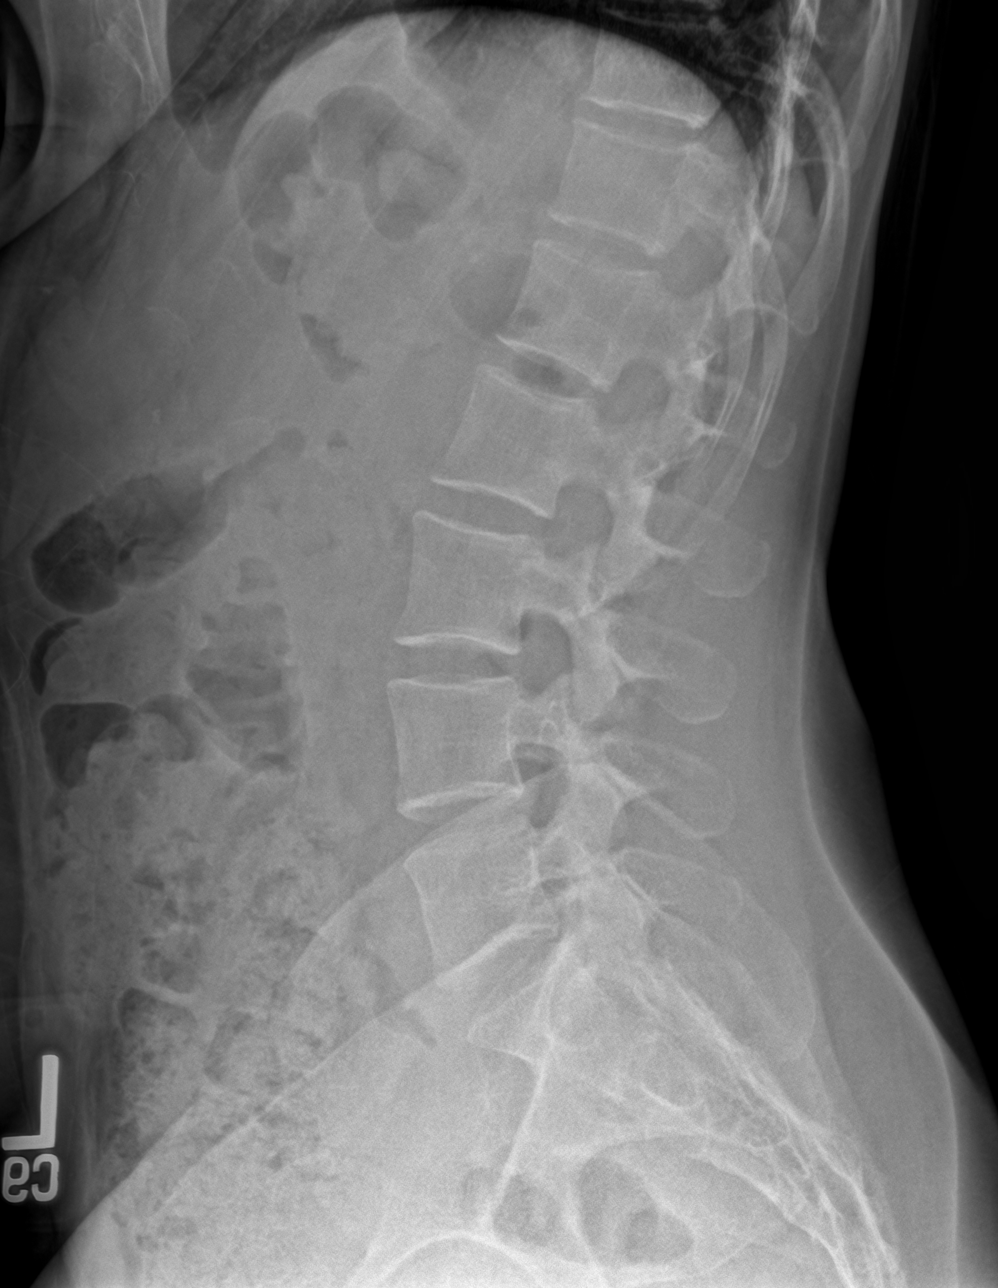
[im 5/5]
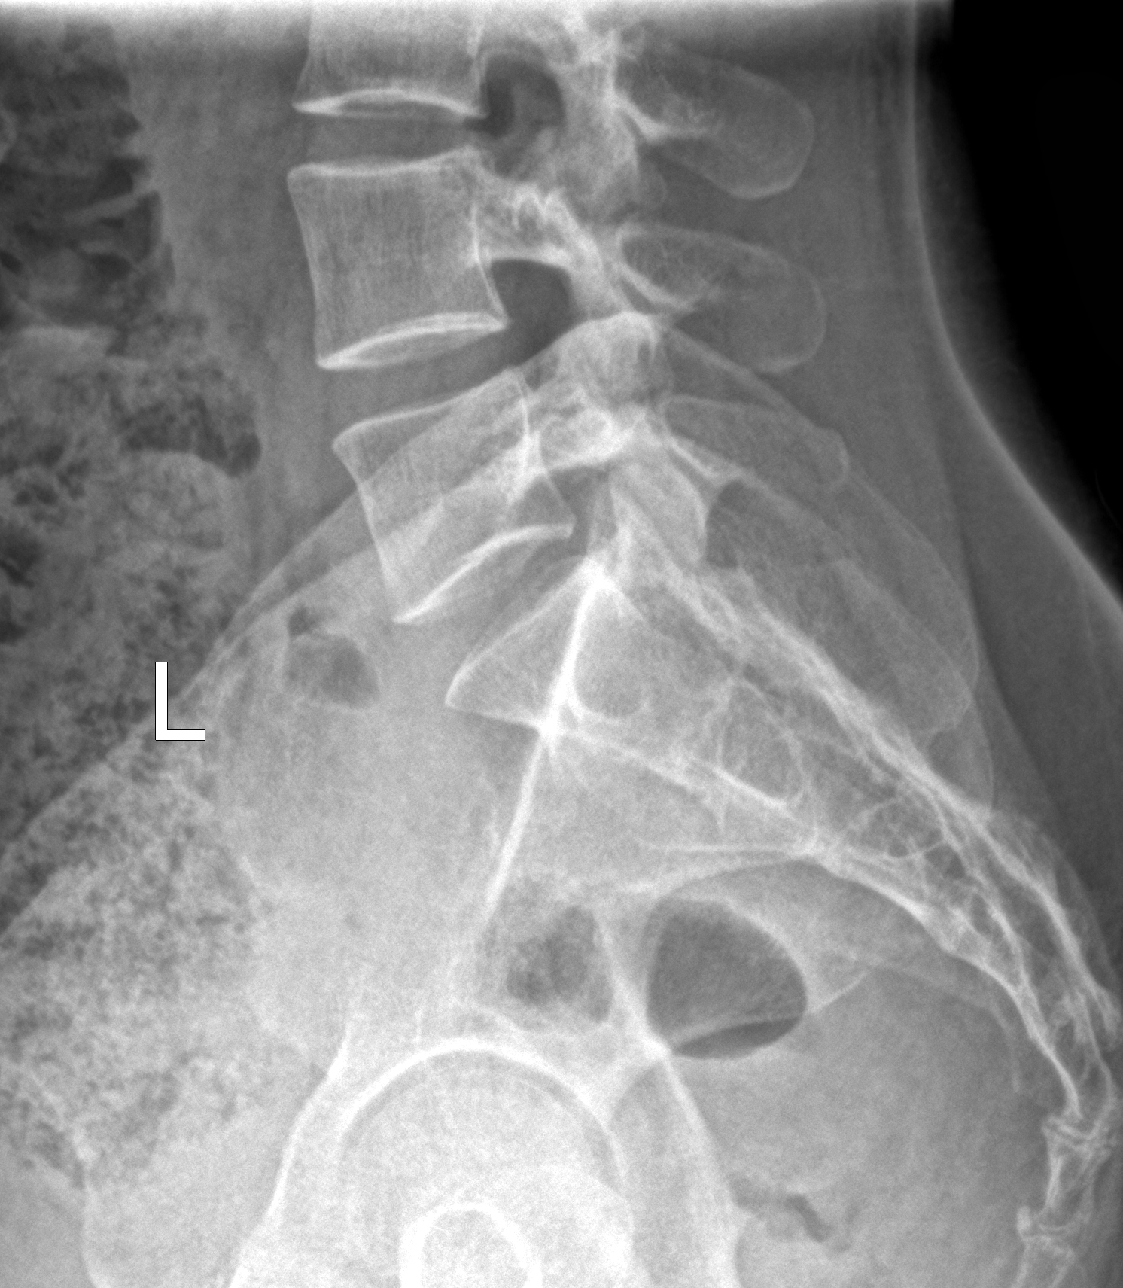

[5 of 5 positions shown; findings below may reference images not displayed]

FINDINGS: Mild dextrocurvature of the lumbar spine. Vertebral body heights and
disc spaces are within normal limits.
IMPRESSION: Negative.

## 2020-11-01 ENCOUNTER — Encounter: Payer: Self-pay | Admitting: Family Medicine

## 2020-11-04 ENCOUNTER — Encounter: Payer: BC Managed Care – PPO | Admitting: Family Medicine

## 2020-11-05 NOTE — Progress Notes (Signed)
Patient ID: SCHERRY LAVERNE, female    DOB: 19-Aug-1971, 49 y.o.   MRN: [id number]  PCP: Delsa Grana, [name]  Chief Complaint  Patient presents with   Headache   Hypertension    Subjective:   NALEE LIGHTLE is a 49 y.o. female, presents to clinic with CC of the following:   Concerns about high BP and having HA's Mychart message sent a few days ago with BP readings: Today 11:15-127/89 9:15- 126/92  Thursday  10:30pm- 125/89 132/86 136/87 124/77 132/90 7:15pm- 134/92 She was asked to get an appt to discuss  No past Dx of HTN, not on meds BP Readings from Last 8 Encounters:  11/06/20 138/82  10/16/20 (!) 122/93  08/30/20 110/62  08/19/20 (!) 130/91  08/13/20 110/60  07/25/20 116/64  05/30/20 114/80  05/03/20 136/88  Pt denies CP, SOB, exertional sx, LE edema, palpitation, visual disturbances, lightheadedness, hypotension, syncope.  HA's gradual onset 1 week ago feels generalized across her entire head but was severe felt most intense located to bilateral temples.  She got poor sleep last Tuesday and woke up Wednesday with a headache.  She has a history of headaches as a teenager which were associated with her menses, her sister also has history of migraines.  She has not had headaches for a few years. She recently had foot surgery about 2 weeks ago, she has been on Tylenol, NSAIDs and Percocets for her postop pain management and previously was not on his medications. She does have some scratchiness in her throat and sounds nasal but she does not feel congested denies sneezing, nasal drainage, postnasal drip, sore throat cough, fever or chills. She is also concerned with feeling like she cannot concentrate or think of things she will forget think she is just told people.  Does not feel well rested  Headache  This is a new problem. The current episode started in the past 7 days. Episode frequency: some coming and going. The problem has been gradually improving.  The pain is located in the bilateral, frontal, temporal and parietal region. The pain does not radiate. The quality of the pain is described as throbbing and aching. The pain is severe. Associated symptoms include photophobia and weight loss. Pertinent negatives include no abdominal pain, abnormal behavior, anorexia, back pain, blurred vision, coughing, dizziness, drainage, ear pain, eye pain, eye redness, eye watering, facial sweating, fever, hearing loss, insomnia, loss of balance, muscle aches, nausea, neck pain, numbness, phonophobia, rhinorrhea, scalp tenderness, seizures, sinus pressure, sore throat, swollen glands, tingling, tinnitus, visual change, vomiting or weakness. Associated symptoms comments: Congestion a little, nasal phonation but doesn't feel like she has nasal allergies or sx right now. The symptoms are aggravated by activity (being up). She has tried NSAIDs, acetaminophen and oral narcotics for the symptoms. The treatment provided significant relief. Her past medical history is significant for migraine headaches, migraines in the family and sinus disease (nasal allergies). There is no history of cancer, cluster headaches, hypertension, immunosuppression, obesity, pseudotumor cerebri, recent head traumas or TMJ.     Patient Active Problem List   Diagnosis Date Noted   Cap polyposis ([place]) 07/27/2020   Abnormal colonoscopy 07/27/2020   History of anemia 07/27/2020   Constipation 07/27/2020   Unintentional weight loss 07/27/2020   Intestinal malabsorption 05/31/2020   Hypocalcemia 05/31/2020   Anemia 05/31/2020   B12 deficiency 05/31/2020   Protein-calorie malnutrition ([place]) 05/31/2020   IBD (inflammatory bowel disease) 05/31/2020   Rectal polyp 01/11/2020   Rectal  bleeding 01/11/2020   Encounter for screening colonoscopy    Rectal mass    Osteoporosis 10/06/2019   Family history of ovarian cancer 07/26/2017   Vitamin D deficiency 05/07/2016   Sickle-cell  trait ([place]) 05/07/2016   Osteoporosis, 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 Take with a full glass of water on an empty stomach., Disp: 12 tablet, Rfl: 3   calcium carbonate (OSCAL) 1500 (600 Ca) MG TABS tablet, Take 600 mg of elemental calcium by mouth 2 (two) times daily., Disp: , Rfl:    Cholecalciferol (VITAMIN D3) 125 MCG (5000 UT) TABS, Take 5,000 Units by mouth every Monday. , Disp: , Rfl:    Cyanocobalamin (B-12 COMPLIANCE INJECTION) 1000 MCG/ML KIT, Inject 1 Dose as directed every 30 (thirty) days., Disp: , Rfl:    diphenhydrAMINE (BENADRYL) 25 MG tablet, Take 25 mg by mouth daily as needed for allergies., Disp: , Rfl:    ferrous gluconate (FERGON) 324 MG tablet, TAKE 1 TABLET BY MOUTH DAILY WITH BREAKFAST, Disp: 90 tablet, Rfl: 1   oxyCODONE-acetaminophen (PERCOCET) 5-325 MG tablet, Take 1-2 tablets by mouth every 6 (six) hours as needed for severe pain., Disp: 30 tablet, Rfl: 0   polyethylene glycol powder (GLYCOLAX/MIRALAX) 17 GM/SCOOP powder, Take 17 g by mouth daily as needed (constipation). (Patient taking differently: Take 17 g by mouth daily as needed for moderate constipation. ), Disp: 3350 g, Rfl: 1   progesterone (PROMETRIUM) 100 MG capsule, Take 100 mg by mouth at bedtime. , Disp: , Rfl:    hydrocortisone (ANUSOL-HC) 25 MG suppository, Place 1 suppository (25 mg total) rectally at bedtime. Nightly for 1 week and then every other night until completion of suppositories. (Patient not taking: Reported on 11/06/2020), Disp: 12 suppository,  Rfl: 0   metroNIDAZOLE (METROGEL VAGINAL) 0.75 % vaginal gel, Place 1 Applicatorful vaginally 2 (two) times daily. (Patient not taking: Reported on 11/06/2020), Disp: 70 g, Rfl: 0   omeprazole (PRILOSEC) 20 MG capsule, Take 1 capsule (20 mg total) by mouth daily for 30 days, THEN 1 capsule (20 mg total) daily., Disp: 90 capsule, Rfl: 3   thiamine 100 MG tablet, Take 100 mg by mouth daily. (Patient not taking: Reported on 10/09/2020), Disp: , Rfl:    tiZANidine (ZANAFLEX) 4 MG tablet, Take 4 mg by mouth every 8 (eight) hours as needed for muscle spasms. (Patient not taking: Reported on 11/06/2020), Disp: , Rfl:   Current Facility-Administered Medications:    cyanocobalamin ((VITAMIN B-12)) injection 1,000 mcg, 1,000 mcg, Intramuscular, Q30 days, Sowles, Krichna, MD, 1,000 mcg at 10/01/20 1515   Allergies  Allergen Reactions   Erythromycin Base Other (See Comments)    Several family members have severe side effects, pt has never taken but should avoid    Penicillins Diarrhea and Rash    Did it involve swelling of the face/tongue/throat, SOB, or low BP? Unknown Did it involve sudden or severe rash/hives, skin peeling, or any reaction on the inside of your mouth or nose? Unknown Did you need to seek medical attention at a hospital  or doctor's office? Unknown When did it last happen?Childhood If all above answers are NO, may proceed with cephalosporin use.   Scallops [Shellfish Allergy] Anaphylaxis     Social History   Tobacco Use   Smoking status: Never Smoker   Smokeless tobacco: Never Used  Vaping Use   Vaping Use: Never used  Substance Use Topics   Alcohol use: Not Currently    Alcohol/week: 1.0 standard drink    Types: 1 Glasses of wine per week   Drug use: No      Chart Review Today: I personally reviewed active problem list, medication list, allergies, family history, social history, health maintenance, notes from last encounter, lab results, imaging with  the patient/caregiver today.   Review of Systems  Constitutional: Positive for weight loss. Negative for fever.  HENT: Negative.  Negative for ear pain, hearing loss, rhinorrhea, sinus pressure, sore throat and tinnitus.   Eyes: Positive for photophobia. Negative for blurred vision, pain and redness.  Respiratory: Negative.  Negative for cough.   Cardiovascular: Negative.   Gastrointestinal: Negative.  Negative for abdominal pain, anorexia, nausea and vomiting.  Endocrine: Negative.   Genitourinary: Negative.   Musculoskeletal: Negative.  Negative for back pain and neck pain.  Skin: Negative.   Allergic/Immunologic: Negative.   Neurological: Positive for headaches. Negative for dizziness, tingling, seizures, weakness, numbness and loss of balance.  Hematological: Negative.   Psychiatric/Behavioral: Negative.  The patient does not have insomnia.   All other systems reviewed and are negative.      Objective:   Vitals:   11/06/20 0849  Pulse: 84  Resp: 16  Temp: 98.2 F (36.8 C)  TempSrc: Oral  SpO2: 99%  Weight: 114 lb 4.8 oz (51.8 kg)  Height: _0  (1.575 m)    Body mass index is 20.91 kg/m.  Physical Exam Vitals and nursing note reviewed.  Constitutional:      General: She is not in acute distress.    Appearance: Normal appearance. She is normal weight. She is not ill-appearing, toxic-appearing or diaphoretic.     Comments: Well-appearing, thin, looks tired  HENT:     Head: Normocephalic and atraumatic.     Jaw: There is normal jaw occlusion.     Salivary Glands: Right salivary gland is not diffusely enlarged or tender. Left salivary gland is not diffusely enlarged or tender.     Right Ear: Tympanic membrane, ear canal and external ear normal. There is no impacted cerumen.     Left Ear: Tympanic membrane, ear canal and external ear normal. There is no impacted cerumen.     Nose: Mucosal edema and congestion present. No rhinorrhea.     Right Nostril: No epistaxis or  occlusion.     Left Nostril: No epistaxis or occlusion.     Right Turbinates: Enlarged and swollen.     Left Turbinates: Enlarged and swollen.     Right Sinus: No maxillary sinus tenderness or frontal sinus tenderness.     Left Sinus: No maxillary sinus tenderness or frontal sinus tenderness.     Mouth/Throat:     Mouth: Mucous membranes are moist.     Pharynx: Oropharynx is clear. Uvula midline. No pharyngeal swelling, oropharyngeal exudate, posterior oropharyngeal erythema or uvula swelling.  Eyes:     General: Lids are normal. No visual field deficit or scleral icterus.       Right eye: No discharge.        Left eye: No discharge.     Extraocular Movements:  Right eye: Normal extraocular motion and no nystagmus.     Left eye: Normal extraocular motion and no nystagmus.     Conjunctiva/sclera: Conjunctivae normal.     Right eye: Right conjunctiva is not injected. No chemosis.    Left eye: Left conjunctiva is not injected. No chemosis.    Pupils: Pupils are equal, round, and reactive to light. Pupils are equal.  Cardiovascular:     Rate and Rhythm: Normal rate and regular rhythm.     Pulses: Normal pulses.     Heart sounds: Normal heart sounds. No murmur heard.  No friction rub. No gallop.   Pulmonary:     Effort: Pulmonary effort is normal.     Breath sounds: Normal breath sounds.  Abdominal:     General: Abdomen is flat.  Skin:    Capillary Refill: Capillary refill takes less than 2 seconds.     Coloration: Skin is not jaundiced or pale.  Neurological:     Mental Status: She is alert.     Cranial Nerves: Cranial nerves are intact. No cranial nerve deficit, dysarthria or facial asymmetry.     Sensory: Sensation is intact. No sensory deficit.     Motor: Motor function is intact. No weakness, tremor or abnormal muscle tone.     Coordination: Coordination is intact.     Comments: Unable to assess gait due to being in a boot and bandage (postop)  Psychiatric:        Mood and  Affect: Mood normal.        Behavior: Behavior normal.      Results for orders placed or performed during the hospital encounter of 10/14/20  SARS CORONAVIRUS 2 (TAT 6-24 HRS) Nasopharyngeal Nasopharyngeal Swab   Specimen: Nasopharyngeal Swab  Result Value Ref Range   SARS Coronavirus 2 NEGATIVE NEGATIVE       Assessment & Plan:     ICD-10-CM   1. Nonintractable headache, unspecified chronicity pattern, unspecified headache type  R51.9    Headache is now resolved onset over a week ago generalized associated with poor night of sleep no focal deficit, keep headache journal  2. Rhinosinusitis  J31.0    J32.9    Nasal phonation, enlarged nasal turbinates and diffusely edematous and erythematous nasal mucosa treat with Flonase and antihistamine for the next 2 weeks  3. Fatigue, unspecified type  R53.83    Likely multifactorial including recent surgery, her medications, pain, poor sleep  4. Elevated BP without diagnosis of hypertension  R03.0    Blood pressure elevated but in the postop setting with pain, poor sleep and headaches suspect is elevated from these things continue to monitor  5. Memory problem  R41.3    She is concerned with about 6 months of feeling decreased mental sharpness decreased memory    Patient with headache onset, gradual, 1 week ago that was generalized all over described as throbbing and pounding, following a bad day and a poor night of sleep, it was gradually improving and now resolved at this point. Ddx: Due to other illness, medication overuse headache due to postop pain management meds, may be due to rhinosinusitis or viral illness, or due to other triggers such as lack of sleep or increased stress Since her symptoms have resolved and encouraged her to continue to monitor and work on managing her sinuses, getting good rest, remaining well-hydrated, and avoiding medication overuse as much as possible  She is concerned about fatigue and memory problems but these  are to also  likely multifactorial -she has had multiple surgeries in hospitalizations, and new diagnoses that she has dealt with over the past year.  Throughout this time she has had a lot of physical changes including a lot of GI symptoms, weight changes, increased anxiety.  With her current fatigue -I explained that I would like to wait until she gets to this postop period and gets off these medications especially the narcotics.  Then I would like to reevaluate.  Possible work-up at that time could include sleep study to rule out sleep apnea She is on multiple supplements as has had her blood levels B12 and iron checked many time so I doubt a deficiency causing any memory issues It also may be related to situational stress and underlying anxiety disorder  Not very concerned with her blood pressure and again would wait until she is out of this postop period and feeling little bit better and then reassess, I see no indication to start medications right now, and I actually encouraged her to stop monitoring her blood pressure since this may only give her something additional to worry about.  Handout given regarding HA's and HA journal - encouraged her to f/up in about 1 month if still having HA's, elevated BP, memory or energy concerns    Delsa Grana, [name] 11/06/20 8:57 AM

## 2020-11-06 ENCOUNTER — Encounter: Payer: Self-pay | Admitting: Family Medicine

## 2020-11-06 ENCOUNTER — Ambulatory Visit (INDEPENDENT_AMBULATORY_CARE_PROVIDER_SITE_OTHER): Payer: BC Managed Care – PPO | Admitting: Family Medicine

## 2020-11-06 ENCOUNTER — Other Ambulatory Visit: Payer: Self-pay

## 2020-11-06 VITALS — BP 138/82 | HR 84 | Temp 98.2°F | Resp 16 | Ht 62.0 in | Wt 114.3 lb

## 2020-11-06 DIAGNOSIS — R519 Headache, unspecified: Secondary | ICD-10-CM

## 2020-11-06 DIAGNOSIS — J310 Chronic rhinitis: Secondary | ICD-10-CM

## 2020-11-06 DIAGNOSIS — R5383 Other fatigue: Secondary | ICD-10-CM | POA: Diagnosis not present

## 2020-11-06 DIAGNOSIS — J329 Chronic sinusitis, unspecified: Secondary | ICD-10-CM

## 2020-11-06 DIAGNOSIS — E538 Deficiency of other specified B group vitamins: Secondary | ICD-10-CM | POA: Diagnosis not present

## 2020-11-06 DIAGNOSIS — R030 Elevated blood-pressure reading, without diagnosis of hypertension: Secondary | ICD-10-CM | POA: Diagnosis not present

## 2020-11-06 DIAGNOSIS — R413 Other amnesia: Secondary | ICD-10-CM

## 2020-11-06 MED ORDER — PROMETHAZINE HCL 25 MG PO TABS
12.5000 mg | ORAL_TABLET | Freq: Four times a day (QID) | ORAL | 0 refills | Status: DC | PRN
Start: 2020-11-06 — End: 2022-04-10

## 2020-11-06 NOTE — Patient Instructions (Signed)
You can try magnesium supplement over the counter which can help with headaches  Focus on good hydration and resting  I would start a intranasal steroid and an antihistamine daily for the next two weeks (like flonase 2 sprays each nostril daily, and one claritin, zyrtec, allergra or generic equivalent one pill daily at bedtime)  I would avoid checking your blood pressure as much - I'm not worried about it and they are likely to be higher than normal with all that you're going through right now.  General Headache Without Cause A headache is pain or discomfort that is felt around the head or neck area. There are many causes and types of headaches. In some cases, the cause may not be found. Follow these instructions at home: Watch your condition for any changes. Let your doctor know about them. Take these steps to help with your condition: Managing pain      Take over-the-counter and prescription medicines only as told by your doctor.  Lie down in a dark, quiet room when you have a headache.  If told, put ice on your head and neck area: ? Put ice in a plastic bag. ? Place a towel between your skin and the bag. ? Leave the ice on for 20 minutes, 2-3 times per day.  If told, put heat on the affected area. Use the heat source that your doctor recommends, such as a moist heat pack or a heating pad. ? Place a towel between your skin and the heat source. ? Leave the heat on for 20-30 minutes. ? Remove the heat if your skin turns bright red. This is very important if you are unable to feel pain, heat, or cold. You may have a greater risk of getting burned.  Keep lights dim if bright lights bother you or make your headaches worse. Eating and drinking  Eat meals on a regular schedule.  If you drink alcohol: ? Limit how much you use to:  0-1 drink a day for women.  0-2 drinks a day for men. ? Be aware of how much alcohol is in your drink. In the [place] one drink equals one 12 oz bottle of  beer (355 mL), one 5 oz glass of wine (148 mL), or one 1 oz glass of hard liquor (44 mL).  Stop drinking caffeine, or reduce how much caffeine you drink. General instructions   Keep a journal to find out if certain things bring on headaches. For example, write down: ? What you eat and drink. ? How much sleep you get. ? Any change to your diet or medicines.  Get a massage or try other ways to relax.  Limit stress.  Sit up straight. Do not tighten (tense) your muscles.  Do not use any products that contain nicotine or tobacco. This includes cigarettes, e-cigarettes, and chewing tobacco. If you need help quitting, ask your doctor.  Exercise regularly as told by your doctor.  Get enough sleep. This often means 7-9 hours of sleep each night.  Keep all follow-up visits as told by your doctor. This is important. Contact a doctor if:  Your symptoms are not helped by medicine.  You have a headache that feels different than the other headaches.  You feel sick to your stomach (nauseous) or you throw up (vomit).  You have a fever. Get help right away if:  Your headache gets very bad quickly.  Your headache gets worse after a lot of physical activity.  You keep throwing up.  You have a stiff neck.  You have trouble seeing.  You have trouble speaking.  You have pain in the eye or ear.  Your muscles are weak or you lose muscle control.  You lose your balance or have trouble walking.  You feel like you will pass out (faint) or you pass out.  You are mixed up (confused).  You have a seizure. Summary  A headache is pain or discomfort that is felt around the head or neck area.  There are many causes and types of headaches. In some cases, the cause may not be found.  Keep a journal to help find out what causes your headaches. Watch your condition for any changes. Let your doctor know about them.  Contact a doctor if you have a headache that is different from usual, or  if your headache is not helped by medicine.  Get help right away if your headache gets very bad, you throw up, you have trouble seeing, you lose your balance, or you have a seizure. This information is not intended to replace advice given to you by your health care provider. Make sure you discuss any questions you have with your health care provider. Document Revised: 06/06/2018 Document Reviewed: 06/06/2018 [employer] Patient Education  [place].  Form - Headache Record There are many types and causes of headaches. A headache record can help guide your treatment plan. Use this form to record the details. Bring this form with you to your follow-up visits. Follow your health care provider's instructions on how to describe your headache. You may be asked to:  Use a pain scale. This is a tool to rate the intensity of your headache using words or numbers.  Describe what your headache feels like, such as dull, achy, throbbing, or sharp. Headache record Date: _______________ Time (from start to end): ____________________ Location of the headache: _________________________  Intensity of the headache: ____________________ Description of the headache: ______________________________________________________________  Hours of sleep the night before the headache: __________  Food or drinks before the headache started: ______________________________________________________________________________________  Events before the headache started: _______________________________________________________________________________________________  Symptoms before the headache started: __________________________________________________________________________________________  Symptoms during the headache: __________________________________________________________________________________________________  Treatment:  ________________________________________________________________________________________________________________  Effect of treatment: _________________________________________________________________________________________________________  Other comments: ___________________________________________________________________________________________________________ Date: _______________ Time (from start to end): ____________________ Location of the headache: _________________________  Intensity of the headache: ____________________ Description of the headache: ______________________________________________________________  Hours of sleep the night before the headache: __________  Food or drinks before the headache started: ______________________________________________________________________________________  Events before the headache started: ____________________________________________________________________________________________  Symptoms before the headache started: _________________________________________________________________________________________  Symptoms during the headache: _______________________________________________________________________________________________  Treatment: ________________________________________________________________________________________________________________  Effect of treatment: _________________________________________________________________________________________________________  Other comments: ___________________________________________________________________________________________________________ Date: _______________ Time (from start to end): ____________________ Location of the headache: _________________________  Intensity of the headache: ____________________ Description of the headache: ______________________________________________________________  Hours of sleep the night before the headache: __________  Food or drinks  before the headache started: ______________________________________________________________________________________  Events before the headache started: ____________________________________________________________________________________________  Symptoms before the headache started: _________________________________________________________________________________________  Symptoms during the headache: _______________________________________________________________________________________________  Treatment: ________________________________________________________________________________________________________________  Effect of treatment: _________________________________________________________________________________________________________  Other comments: ___________________________________________________________________________________________________________ Date: _______________ Time (from start to end): ____________________ Location of the headache: _________________________  Intensity of the headache: ____________________ Description of the headache: ______________________________________________________________  Hours of sleep the night before the headache: _________  Food or drinks before the headache started: ______________________________________________________________________________________  Events before the headache started: ____________________________________________________________________________________________  Symptoms before the headache started: _________________________________________________________________________________________  Symptoms during the headache: _______________________________________________________________________________________________  Treatment: ________________________________________________________________________________________________________________  Effect of treatment:  _________________________________________________________________________________________________________  Other comments: ___________________________________________________________________________________________________________ Date: _______________ Time (from start to end): ____________________ Location of the headache: _________________________  Intensity of the headache: ____________________ Description of the headache: ______________________________________________________________  Hours of sleep the night before the headache: _________  Food or drinks before the headache started: ______________________________________________________________________________________  Events before the headache started: ____________________________________________________________________________________________  Symptoms before the headache started: _________________________________________________________________________________________  Symptoms during the headache: _______________________________________________________________________________________________  Treatment: ________________________________________________________________________________________________________________  Effect of treatment: _________________________________________________________________________________________________________  Other comments: ___________________________________________________________________________________________________________ This information is not intended to replace advice given to you by your health care provider. Make sure you discuss any questions you have with your health care provider. Document Revised: 12/05/2018 Document Reviewed: 12/05/2018 Elsevier Patient Education  [place].

## 2020-11-25 ENCOUNTER — Ambulatory Visit: Payer: BC Managed Care – PPO | Admitting: Family Medicine

## 2020-11-25 ENCOUNTER — Other Ambulatory Visit: Payer: Self-pay | Admitting: Family Medicine

## 2020-11-27 ENCOUNTER — Other Ambulatory Visit: Payer: Self-pay

## 2020-11-27 ENCOUNTER — Encounter: Payer: Self-pay | Admitting: Family Medicine

## 2020-11-27 ENCOUNTER — Ambulatory Visit (INDEPENDENT_AMBULATORY_CARE_PROVIDER_SITE_OTHER): Payer: BC Managed Care – PPO | Admitting: Family Medicine

## 2020-11-27 VITALS — BP 112/72 | HR 98 | Temp 98.1°F | Resp 16 | Ht 62.0 in | Wt 111.4 lb

## 2020-11-27 DIAGNOSIS — E46 Unspecified protein-calorie malnutrition: Secondary | ICD-10-CM

## 2020-11-27 DIAGNOSIS — Z8619 Personal history of other infectious and parasitic diseases: Secondary | ICD-10-CM

## 2020-11-27 DIAGNOSIS — E559 Vitamin D deficiency, unspecified: Secondary | ICD-10-CM | POA: Diagnosis not present

## 2020-11-27 DIAGNOSIS — Z1159 Encounter for screening for other viral diseases: Secondary | ICD-10-CM

## 2020-11-27 DIAGNOSIS — D573 Sickle-cell trait: Secondary | ICD-10-CM

## 2020-11-27 DIAGNOSIS — E538 Deficiency of other specified B group vitamins: Secondary | ICD-10-CM | POA: Diagnosis not present

## 2020-11-27 DIAGNOSIS — E7800 Pure hypercholesterolemia, unspecified: Secondary | ICD-10-CM

## 2020-11-27 DIAGNOSIS — D649 Anemia, unspecified: Secondary | ICD-10-CM | POA: Diagnosis not present

## 2020-11-27 DIAGNOSIS — Z0000 Encounter for general adult medical examination without abnormal findings: Secondary | ICD-10-CM

## 2020-11-27 DIAGNOSIS — K909 Intestinal malabsorption, unspecified: Secondary | ICD-10-CM

## 2020-11-27 DIAGNOSIS — Z23 Encounter for immunization: Secondary | ICD-10-CM | POA: Diagnosis not present

## 2020-11-27 DIAGNOSIS — Z5181 Encounter for therapeutic drug level monitoring: Secondary | ICD-10-CM

## 2020-11-27 DIAGNOSIS — M810 Age-related osteoporosis without current pathological fracture: Secondary | ICD-10-CM

## 2020-11-27 MED ORDER — ZOSTER VAC RECOMB ADJUVANTED 50 MCG/0.5ML IM SUSR: 0.5000 mL | Freq: Once | INTRAMUSCULAR | 1 refills | Status: AC

## 2020-11-27 MED ORDER — ALENDRONATE SODIUM 70 MG PO TABS: 70.0000 mg | ORAL_TABLET | ORAL | 0 refills | Status: DC

## 2020-11-27 NOTE — Progress Notes (Signed)
Patient: Angela Gordon, Female    DOB: 19-May-1971, 48 y.o.   MRN: [id number] Delsa Grana, [name] Visit Date: 11/27/2020  Today's Provider: Delsa Grana, [name]   Chief Complaint  Patient presents with  . Annual Exam   Subjective:   Annual physical exam:  Angela Gordon is a 49 y.o. female who presents today for complete physical exam:  Exercise/Activity:  Limited currently with right foot surgery  Diet/nutrition:  Appetite better Sleep:   Good and bad days  Weight fairly stable-appetite still good Wt Readings from Last 5 Encounters:  11/27/20 111 lb 6.4 oz (50.5 kg)  11/06/20 114 lb 4.8 oz (51.8 kg)  10/16/20 111 lb 1.6 oz (50.4 kg)  08/30/20 108 lb 6.4 oz (49.2 kg)  08/16/20 107 lb (48.5 kg)   BMI Readings from Last 5 Encounters:  11/27/20 20.38 kg/m  11/06/20 20.91 kg/m  10/16/20 20.32 kg/m  08/30/20 19.83 kg/m  08/16/20 19.57 kg/m   Having recurrent hot flashes, has pellets  Was prescribed progesterone -[facility] of [facility]/GYN and hormonal testing and treatment?  Patient is still supplementing with B12 injections, and oral iron supplement following with GI and she has a upcoming repeated flex sig, but they have deferred labs to [place] to be done with her physical today rechecking her iron levels, anemia, B12  She also has history of osteoporosis on Fosamax, with high-dose vitamin D supplement history of low calcium  Her GI symptoms have been gradually improving seems less sensitive to different types of foods or to dairy no longer having any pain cramping or loose bowels, her iron sometimes causes some constipation   USPSTF grade A and B recommendations - reviewed and addressed today  Depression:  Phq 9 completed today by patient, was reviewed by me with patient in the room PHQ score is negative, pt feels good PHQ 2/9 Scores 11/27/2020 11/06/2020 08/30/2020 08/13/2020  PHQ - 2 Score 0 0 0 0  PHQ- 9 Score - - - -   Depression screen [facility] 2/9  11/27/2020 11/06/2020 08/30/2020 08/13/2020 05/30/2020  Decreased Interest 0 0 0 0 0  Down, Depressed, Hopeless 0 0 0 0 0  PHQ - 2 Score 0 0 0 0 0  Altered sleeping - - - - 0  Tired, decreased energy - - - - 3  Change in appetite - - - - 3  Feeling bad or failure about yourself  - - - - 0  Trouble concentrating - - - - 0  Moving slowly or fidgety/restless - - - - 3  Suicidal thoughts - - - - 0  PHQ-9 Score - - - - 9  Difficult doing work/chores - - - - Very difficult    Alcohol screening: [place] Office Visit from 11/27/2020 in [facility]  AUDIT-C Score 0      Immunizations and Health Maintenance: Health Maintenance  Topic Date Due  . Hepatitis C Screening  Never done  . INFLUENZA VACCINE  02/27/2021 (Originally 06/30/2020)  . HIV Screening  05/03/2021 (Originally 03/20/1987)  . MAMMOGRAM  10/07/2021  . DEXA SCAN  11/05/2021  . TETANUS/TDAP  05/08/2028  . COLONOSCOPY (Pts 45-20yr Insurance coverage will need to be confirmed)  08/19/2030  . COVID-19 Vaccine  Completed  . PAP SMEAR-Modifier  Discontinued     Hep C Screening: due  STD testing and prevention (HIV/chl/gon/syphilis):  see above, no additional testing desired by pt today -  Had in the  past, monogomous, declines  Intimate partner violence:  Denies   Sexual History/Pain during Intercourse: Married - sees GYN at [facility] - more vaginal dryness - she will f/up  Menstrual History/LMP/Abnormal Bleeding:  No LMP recorded. Patient has had a hysterectomy.  Incontinence Symptoms: none  Breast cancer: mammogram just done Last Mammogram: *see HM list above BRCA gene screening: ?  Cervical cancer screening:  Total hysterectomy  Breast CA hx in family Family members also had cervical and ovarian CA  Osteoporosis:   Discussion on osteoporosis per age, including high calcium and vitamin D supplementation, weight bearing exercises Pt is supplementing with daily calcium/Vit D. On fosamax with D +,  doing dexa screening  Started fosamax 2017 - in 05/07/2016  Skin cancer:  Hx of skin CA -  NO Discussed atypical lesions   Colorectal cancer:  Seeing GI - multiple procedures/scopes/colonoscopy - GI will do f/up flex sig and then put in timing to next colonoscopy   Lung cancer:   Low Dose CT Chest recommended if Age 13-80 years, 30 pack-year currently smoking OR have quit w/in 15years. Patient does not qualify.    Social History   Tobacco Use  . Smoking status: Never Smoker  . Smokeless tobacco: Never Used  Vaping Use  . Vaping Use: Never used  Substance Use Topics  . Alcohol use: Not Currently    Alcohol/week: 1.0 standard drink    Types: 1 Glasses of wine per week  . Drug use: No     Flowsheet Row Office Visit from 11/27/2020 in [facility]  AUDIT-C Score 0      Family History  Problem Relation Age of Onset  . Hypertension Mother   . Osteoporosis Mother   . Cervical cancer Mother        Hysterectomy in 59s.  . Stroke Mother   . Crohn's disease Mother   . Irritable bowel syndrome Mother   . Sickle cell trait Father   . Sickle cell trait Sister   . Heart disease Maternal Aunt   . Breast cancer Maternal Aunt        2 mat aunts  . Heart disease Maternal Uncle   . Hypertension Maternal Uncle   . Ovarian cancer Paternal Aunt 12  . Breast cancer Paternal Aunt        pat great aunt  . Heart disease Maternal Grandmother   . Hypertension Maternal Grandmother   . Osteoporosis Maternal Grandmother   . Rheum arthritis Maternal Grandfather   . Hypertension Maternal Grandfather   . Prostate cancer Maternal Grandfather   . Leukemia Cousin   . Colon cancer Neg Hx   . Esophageal cancer Neg Hx   . Pancreatic cancer Neg Hx   . Stomach cancer Neg Hx   . Inflammatory bowel disease Neg Hx   . Liver disease Neg Hx   . Rectal cancer Neg Hx      Blood pressure/Hypertension: BP Readings from Last 3 Encounters:  11/27/20 112/72  11/06/20 138/82  10/16/20  (!) 122/93    Weight/Obesity: Wt Readings from Last 3 Encounters:  11/27/20 111 lb 6.4 oz (50.5 kg)  11/06/20 114 lb 4.8 oz (51.8 kg)  10/16/20 111 lb 1.6 oz (50.4 kg)   BMI Readings from Last 3 Encounters:  11/27/20 20.38 kg/m  11/06/20 20.91 kg/m  10/16/20 20.32 kg/m     Lipids:  Lab Results  Component Value Date   CHOL 197 11/03/2019   CHOL 143 01/08/2016   Lab Results  Component Value Date   HDL 74 11/03/2019   HDL 59 01/08/2016   Lab Results  Component Value Date   LDLCALC 108 (H) 11/03/2019   LDLCALC 71 01/08/2016   Lab Results  Component Value Date   TRIG 68 11/03/2019   TRIG 64 01/08/2016   Lab Results  Component Value Date   CHOLHDL 2.7 11/03/2019   No results found for: LDLDIRECT Based on the results of lipid panel his/her cardiovascular risk factor ( using [facility] )  in the next 10 years is: The 10-year ASCVD risk score Mikey Bussing DC Brooke Bonito., et al., 2013) is: 0.5%   Values used to calculate the score:     Age: 50 years     Sex: Female     Is Non-Hispanic African American: No     Diabetic: No     Tobacco smoker: No     Systolic Blood Pressure: 732 mmHg     Is BP treated: No     HDL Cholesterol: 74 mg/dL     Total Cholesterol: 197 mg/dL Glucose:  Glucose, Bld  Date Value Ref Range Status  07/25/2020 88 70 - 99 mg/dL Final  05/30/2020 86 65 - 99 mg/dL Final    Comment:    .            Fasting reference interval .   01/10/2020 87 70 - 99 mg/dL Final   Hypertension: BP Readings from Last 3 Encounters:  11/27/20 112/72  11/06/20 138/82  10/16/20 (!) 122/93   Obesity: Wt Readings from Last 3 Encounters:  11/27/20 111 lb 6.4 oz (50.5 kg)  11/06/20 114 lb 4.8 oz (51.8 kg)  10/16/20 111 lb 1.6 oz (50.4 kg)   BMI Readings from Last 3 Encounters:  11/27/20 20.38 kg/m  11/06/20 20.91 kg/m  10/16/20 20.32 kg/m      Advanced Care Planning:  A voluntary discussion about advance care planning including the explanation and discussion of  advance directives.     Social History      She        Social History   Socioeconomic History  . Marital status: Married    Spouse name: julius  . Number of children: 2  . Years of education: Not on file  . Highest education level: Not on file  Occupational History  . Not on file  Tobacco Use  . Smoking status: Never Smoker  . Smokeless tobacco: Never Used  Vaping Use  . Vaping Use: Never used  Substance and Sexual Activity  . Alcohol use: Not Currently    Alcohol/week: 1.0 standard drink    Types: 1 Glasses of wine per week  . Drug use: No  . Sexual activity: Yes    Birth control/protection: Surgical    Comment: Hysterectomy  Other Topics Concern  . Not on file  Social History Narrative  . Not on file   Social Determinants of Health   Financial Resource Strain: Low Risk   . Difficulty of Paying Living Expenses: Not hard at all  Food Insecurity: No Food Insecurity  . Worried About Charity fundraiser in the Last Year: Never true  . Ran Out of Food in the Last Year: Never true  Transportation Needs: No Transportation Needs  . Lack of Transportation (Medical): No  . Lack of Transportation (Non-Medical): No  Physical Activity: Inactive  . Days of Exercise per Week: 0 days  . Minutes of Exercise per Session: 0 min  Stress: Stress Concern Present  .  Feeling of Stress : To some extent  Social Connections: Socially Integrated  . Frequency of Communication with Friends and Family: More than three times a week  . Frequency of Social Gatherings with Friends and Family: More than three times a week  . Attends Religious Services: More than 4 times per year  . Active Member of Clubs or Organizations: Yes  . Attends Archivist Meetings: More than 4 times per year  . Marital Status: Married    Family History        Family History  Problem Relation Age of Onset  . Hypertension Mother   . Osteoporosis Mother   . Cervical cancer Mother        Hysterectomy in  53s.  . Stroke Mother   . Crohn's disease Mother   . Irritable bowel syndrome Mother   . Sickle cell trait Father   . Sickle cell trait Sister   . Heart disease Maternal Aunt   . Breast cancer Maternal Aunt        2 mat aunts  . Heart disease Maternal Uncle   . Hypertension Maternal Uncle   . Ovarian cancer Paternal Aunt 63  . Breast cancer Paternal Aunt        pat great aunt  . Heart disease Maternal Grandmother   . Hypertension Maternal Grandmother   . Osteoporosis Maternal Grandmother   . Rheum arthritis Maternal Grandfather   . Hypertension Maternal Grandfather   . Prostate cancer Maternal Grandfather   . Leukemia Cousin   . Colon cancer Neg Hx   . Esophageal cancer Neg Hx   . Pancreatic cancer Neg Hx   . Stomach cancer Neg Hx   . Inflammatory bowel disease Neg Hx   . Liver disease Neg Hx   . Rectal cancer Neg Hx     Patient Active Problem List   Diagnosis Date Noted  . Cap polyposis ([place]) 07/27/2020  . Abnormal colonoscopy 07/27/2020  . History of anemia 07/27/2020  . Constipation 07/27/2020  . Unintentional weight loss 07/27/2020  . Intestinal malabsorption 05/31/2020  . Hypocalcemia 05/31/2020  . Anemia 05/31/2020  . B12 deficiency 05/31/2020  . Protein-calorie malnutrition ([place]) 05/31/2020  . IBD (inflammatory bowel disease) 05/31/2020  . Rectal polyp 01/11/2020  . Rectal bleeding 01/11/2020  . Encounter for screening colonoscopy   . Rectal mass   . Osteoporosis 10/06/2019  . Family history of ovarian cancer 07/26/2017  . Vitamin D deficiency 05/07/2016  . Sickle-cell trait ([place]) 05/07/2016  . 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 ABDOMINAL HYSTERECTOMY  2010   total -Menopausal symptoms, takes HRT  . BIOPSY  02/26/2020   Procedure: BIOPSY;  Surgeon: Rush Landmark Telford Nab., MD;  Location: [place];  Service: Gastroenterology;;  . BIOPSY  08/19/2020   Procedure: BIOPSY;  Surgeon:  Irving Copas., MD;  Location: [place];  Service: Gastroenterology;;  . BREAST EXCISIONAL BIOPSY Left 2003   neg bx dr Jamal Collin  . BREAST SURGERY Left 2004   biopsy, benign  . COLONOSCOPY N/A 08/19/2020   Procedure: COLONOSCOPY;  Surgeon: Mansouraty, Telford Nab., MD;  Location: [place];  Service: Gastroenterology;  Laterality: N/A;  . COLONOSCOPY W/ BIOPSIES  05/09/2020   [facility] - removed a "Cap Polyp" per patient  . COLONOSCOPY WITH PROPOFOL N/A 11/29/2019   Procedure: COLONOSCOPY WITH PROPOFOL;  Surgeon: Virgel Manifold, MD;  Location: [facility];  Service: Endoscopy;  Laterality: N/A;  . COLONOSCOPY WITH PROPOFOL  N/A 02/26/2020   Procedure: COLONOSCOPY WITH PROPOFOL;  Surgeon: Rush Landmark Telford Nab., MD;  Location: [place];  Service: Gastroenterology;  Laterality: N/A;  . CYST REMOVAL HAND Right 2005-06  . ESOPHAGOGASTRODUODENOSCOPY (EGD) WITH PROPOFOL N/A 08/19/2020   Procedure: ESOPHAGOGASTRODUODENOSCOPY (EGD) WITH PROPOFOL;  Surgeon: Rush Landmark Telford Nab., MD;  Location: [place];  Service: Gastroenterology;  Laterality: N/A;  . EUS  02/26/2020   Procedure: LOWER ENDOSCOPIC ULTRASOUND (EUS);  Surgeon: Irving Copas., MD;  Location: [place];  Service: Gastroenterology;;  . Otho Darner SIGMOIDOSCOPY  05/17/20, 05/19/20   x 2 at [place] Right 10/16/2020   Procedure: DOUBLE OSTEOTOMY RIGHT;  Surgeon: [name], DPM;  Location: [place];  Service: Podiatry;  Laterality: Right;  . HERNIA REPAIR  [id number]   umbilical  . TONSILLECTOMY  1978     Current Outpatient Medications:  .  acetaminophen (TYLENOL) 325 MG tablet, Take 325-650 mg by mouth every 6 (six) hours as needed for moderate pain., Disp: , Rfl:  .  albuterol (VENTOLIN HFA) 108 (90 Base) MCG/ACT inhaler, Inhale 2 puffs into the lungs every 6 (six) hours as needed for wheezing or shortness of breath., Disp: , Rfl:  .  alendronate-cholecalciferol  (FOSAMAX PLUS D) 70-2800 MG-UNIT tablet, Take 1 tablet by mouth every Wednesday. Take with a full glass of water on an empty stomach., Disp: 12 tablet, Rfl: 3 .  calcium carbonate (OSCAL) 1500 (600 Ca) MG TABS tablet, Take 600 mg of elemental calcium by mouth 2 (two) times daily., Disp: , Rfl:  .  Cholecalciferol (VITAMIN D3) 125 MCG (5000 UT) TABS, Take 5,000 Units by mouth every Monday. , Disp: , Rfl:  .  Cyanocobalamin (B-12 COMPLIANCE INJECTION) 1000 MCG/ML KIT, Inject 1 Dose as directed every 30 (thirty) days., Disp: , Rfl:  .  diphenhydrAMINE (BENADRYL) 25 MG tablet, Take 25 mg by mouth daily as needed for allergies., Disp: , Rfl:  .  ferrous gluconate (FERGON) 324 MG tablet, TAKE 1 TABLET BY MOUTH DAILY WITH BREAKFAST, Disp: 90 tablet, Rfl: 1 .  metroNIDAZOLE (METROGEL VAGINAL) 0.75 % vaginal gel, Place 1 Applicatorful vaginally 2 (two) times daily., Disp: 70 g, Rfl: 0 .  progesterone (PROMETRIUM) 100 MG capsule, Take 100 mg by mouth at bedtime. , Disp: , Rfl:  .  promethazine (PHENERGAN) 25 MG tablet, Take 0.5-1 tablets (12.5-25 mg total) by mouth every 6 (six) hours as needed for nausea or vomiting (headaches)., Disp: 20 tablet, Rfl: 0 .  hydrocortisone (ANUSOL-HC) 25 MG suppository, Place 1 suppository (25 mg total) rectally at bedtime. Nightly for 1 week and then every other night until completion of suppositories. (Patient not taking: No sig reported), Disp: 12 suppository, Rfl: 0 .  omeprazole (PRILOSEC) 20 MG capsule, Take 1 capsule (20 mg total) by mouth daily for 30 days, THEN 1 capsule (20 mg total) daily., Disp: 90 capsule, Rfl: 3 .  oxyCODONE-acetaminophen (PERCOCET) 5-325 MG tablet, Take 1-2 tablets by mouth every 6 (six) hours as needed for severe pain. (Patient not taking: Reported on 11/27/2020), Disp: 30 tablet, Rfl: 0 .  polyethylene glycol powder (GLYCOLAX/MIRALAX) 17 GM/SCOOP powder, Take 17 g by mouth daily as needed (constipation). (Patient not taking: Reported on  11/27/2020), Disp: 3350 g, Rfl: 1 .  thiamine 100 MG tablet, Take 100 mg by mouth daily. (Patient not taking: No sig reported), Disp: , Rfl:  .  tiZANidine (ZANAFLEX) 4 MG tablet, Take 4 mg by mouth every 8 (eight) hours as needed  for muscle spasms. (Patient not taking: No sig reported), Disp: , Rfl:   Current Facility-Administered Medications:  .  cyanocobalamin ((VITAMIN B-12)) injection 1,000 mcg, 1,000 mcg, Intramuscular, Q30 days, Steele Sizer, MD, 1,000 mcg at 11/06/20 0934  Allergies  Allergen Reactions  . Erythromycin Base Other (See Comments)    Several family members have severe side effects, pt has never taken but should avoid   . Penicillins Diarrhea and Rash    Did it involve swelling of the face/tongue/throat, SOB, or low BP? Unknown Did it involve sudden or severe rash/hives, skin peeling, or any reaction on the inside of your mouth or nose? Unknown Did you need to seek medical attention at a hospital or doctor's office? Unknown When did it last happen?Childhood If all above answers are "NO", may proceed with cephalosporin use.  Jola Babinski Allergy] Anaphylaxis    Patient Care Team: Delsa Grana, [name] as PCP - General (Family Medicine)  Review of Systems  10 Systems reviewed and are negative for acute change except as noted in the HPI.   I personally reviewed active problem list, medication list, allergies, family history, social history, health maintenance, notes from last encounter, lab results, imaging with the patient/caregiver today.        Objective:   Vitals:  Vitals:   11/27/20 1119  BP: 112/72  Pulse: 98  Resp: 16  Temp: 98.1 F (36.7 C)  TempSrc: Oral  SpO2: 99%  Weight: 111 lb 6.4 oz (50.5 kg)  Height: 5' 2"  (1.575 m)    Body mass index is 20.38 kg/m.  Physical Exam Vitals and nursing note reviewed.  Constitutional:      General: She is not in acute distress.    Appearance: Normal appearance. She is well-developed and  normal weight. She is not ill-appearing, toxic-appearing or diaphoretic.     Interventions: Face mask in place.  HENT:     Head: Normocephalic and atraumatic.     Right Ear: External ear normal.     Left Ear: External ear normal.     Nose: Nose normal. No congestion or rhinorrhea.     Mouth/Throat:     Mouth: Mucous membranes are moist.     Pharynx: Oropharynx is clear. No oropharyngeal exudate.  Eyes:     General: Lids are normal. No scleral icterus.       Right eye: No discharge.        Left eye: No discharge.     Conjunctiva/sclera: Conjunctivae normal.     Pupils: Pupils are equal, round, and reactive to light.  Neck:     Thyroid: No thyroid mass, thyromegaly or thyroid tenderness.     Trachea: Trachea and phonation normal. No tracheal deviation.  Cardiovascular:     Rate and Rhythm: Normal rate and regular rhythm.     Pulses: Normal pulses.          Radial pulses are 2+ on the right side and 2+ on the left side.       Posterior tibial pulses are 2+ on the right side and 2+ on the left side.     Heart sounds: Normal heart sounds. No murmur heard. No friction rub. No gallop.   Pulmonary:     Effort: Pulmonary effort is normal. No respiratory distress.     Breath sounds: Normal breath sounds. No stridor. No wheezing, rhonchi or rales.  Chest:     Chest wall: No tenderness.  Abdominal:     General: Bowel sounds are normal.  There is no distension.     Palpations: Abdomen is soft.     Tenderness: There is no abdominal tenderness. There is no right CVA tenderness, left CVA tenderness or guarding.  Musculoskeletal:     Cervical back: Normal range of motion and neck supple.     Right lower leg: No edema.     Left lower leg: No edema.     Comments: Right foot in ace wrap and post-op shoe  Lymphadenopathy:     Cervical: No cervical adenopathy.  Skin:    General: Skin is warm and dry.     Capillary Refill: Capillary refill takes less than 2 seconds.     Coloration: Skin is not  jaundiced or pale.     Findings: No rash.  Neurological:     Mental Status: She is alert. Mental status is at baseline.     Motor: No abnormal muscle tone.     Gait: Gait normal.  Psychiatric:        Mood and Affect: Mood normal.        Speech: Speech normal.        Behavior: Behavior normal.        Thought Content: Thought content normal.       Fall Risk: Fall Risk  11/27/2020 11/06/2020 08/30/2020 08/13/2020 05/30/2020  Falls in the past year? 0 0 0 0 0  Number falls in past yr: 0 0 0 0 0  Injury with Fall? 0 0 0 0 0  Follow up Falls evaluation completed Falls evaluation completed - Falls evaluation completed -    Functional Status Survey: Is the patient deaf or have difficulty hearing?: No Does the patient have difficulty seeing, even when wearing glasses/contacts?: No Does the patient have difficulty concentrating, remembering, or making decisions?: No Does the patient have difficulty walking or climbing stairs?: Yes (due to foot surgery) Does the patient have difficulty dressing or bathing?: Yes Does the patient have difficulty doing errands alone such as visiting a doctor's office or shopping?: No   Assessment & Plan:    CPE completed today  . USPSTF grade A and B recommendations reviewed with patient; age-appropriate recommendations, preventive care, screening tests, etc discussed and encouraged; healthy living encouraged; see AVS for patient education given to patient  . Discussed importance of 150 minutes of physical activity weekly, AHA exercise recommendations given to pt in AVS/handout  . Discussed importance of healthy diet:  eating lean meats and proteins, avoiding trans fats and saturated fats, avoid simple sugars and excessive carbs in diet, eat 6 servings of fruit/vegetables daily and drink plenty of water and avoid sweet beverages.    . Recommended pt to do annual eye exam and routine dental exams/cleanings  . Depression, alcohol, fall screening completed as  documented above and per flowsheets  . Reviewed Health Maintenance: Health Maintenance  Topic Date Due  . Hepatitis C Screening  Never done  . INFLUENZA VACCINE  02/27/2021 (Originally 06/30/2020)  . HIV Screening  05/03/2021 (Originally 03/20/1987)  . MAMMOGRAM  10/07/2021  . DEXA SCAN  11/05/2021  . TETANUS/TDAP  05/08/2028  . COLONOSCOPY (Pts 45-66yr Insurance coverage will need to be confirmed)  08/19/2030  . COVID-19 Vaccine  Completed  . PAP SMEAR-Modifier  Discontinued    . Immunizations: Immunization History  Administered Date(s) Administered  . Influenza, Seasonal, Injecte, Preservative Fre 12/13/2014  . Influenza,inj,Quad PF,6+ Mos 11/25/2017, 10/21/2018  . Influenza-Unspecified 10/10/2015  . PFIZER SARS-COV-2 Vaccination 02/19/2020, 03/13/2020  . Tdap 05/08/2018  ICD-10-CM   1. Adult general medical exam  Z00.00 CBC with Differential/Platelet    COMPLETE METABOLIC PANEL WITH GFR    Lipid panel  2. B12 deficiency  E53.8 CBC with Differential/Platelet   doing injections, recheck and monitor for continued need  3. Anemia, unspecified type  D64.9 CBC with Differential/Platelet    Vitamin B12    Iron, TIBC and Ferritin Panel   b12 and iron deficiency - iron daily oral  4. Vitamin D deficiency  W73.7 COMPLETE METABOLIC PANEL WITH GFR    VITAMIN D 25 Hydroxy (Vit-D Deficiency, Fractures)   high dose supplement with hx of osteoporosis  5. Intestinal malabsorption, unspecified type  K90.9 CBC with Differential/Platelet    COMPLETE METABOLIC PANEL WITH GFR    VITAMIN D 25 Hydroxy (Vit-D Deficiency, Fractures)    Vitamin B12    Iron, TIBC and Ferritin Panel   Multiple deficiencies, recheck  6. Hypocalcemia  T06.26 COMPLETE METABOLIC PANEL WITH GFR    VITAMIN D 25 Hydroxy (Vit-D Deficiency, Fractures)   recheck labs  7. Osteoporosis, post-menopausal  M81.0 alendronate (FOSAMAX) 70 MG tablet    COMPLETE METABOLIC PANEL WITH GFR    VITAMIN D 25 Hydroxy (Vit-D  Deficiency, Fractures)   5 year mark on fosamax is next June 2022, repeat dexa due 10/2021, consider prolia injections/referral to specialists  8. Sickle-cell trait ([place])  D57.3 CBC with Differential/Platelet  9. Encounter for medication monitoring  Z51.81 CBC with Differential/Platelet    COMPLETE METABOLIC PANEL WITH GFR    VITAMIN D 25 Hydroxy (Vit-D Deficiency, Fractures)    Vitamin B12    Iron, TIBC and Ferritin Panel  10. Encounter for hepatitis C screening test for low risk patient  Z11.59 Hepatitis C antibody  11. Protein-calorie malnutrition, unspecified severity ([place])  E46 CBC with Differential/Platelet    COMPLETE METABOLIC PANEL WITH GFR   Small amount of weight gain however she is still down about 20 pounds  12. Pure hypercholesterolemia  R48.54 COMPLETE METABOLIC PANEL WITH GFR    Lipid panel   Cholesterol was elevated last year, recheck, may be improved with her weight loss, discussed diet and lifestyle efforts and ASCVD risk (low)  13. History of shingles  Z86.19 Zoster Vaccine Adjuvanted [facility]) injection   History of shingles x2 she would like to see if she can get Shingrix -discussed with patient to call her insurance and I will send to pharmacy  14. Need for shingles vaccine  Z23 Zoster Vaccine Adjuvanted [facility]) injection   Feel Shingrix is appropriate with her history of 2 infections in the past  15. Need for influenza vaccination  Z23 Flu Vaccine QUAD 6+ mos PF IM (Fluarix Quad PF)   Change in patient's Fosamax medication due to being backordered -discussed med changes with the patient sent in Fosamax 70 mg tablet, she will need to do over-the-counter higher dose vitamin D supplement -2000 to 3000 IU (unavailable med had 2800 IU)  In June she will have completed 5 years of Fosamax and may need a referral to a specialist at that time for evaluation for need for Prolia injections?  Follow-up bone density and mammogram will be the end of next year November and  December Reviewed her most recent mammogram  Delsa Grana, [name] 11/27/20 11:44 AM  [facility]

## 2020-11-27 NOTE — Patient Instructions (Addendum)
Health Maintenance  Topic Date Due  .  Hepatitis C: One time screening is recommended by Center for Disease Control  (CDC) for  adults born from [birth year] through 1964.   Never done  . Flu Shot  02/27/2021*  . HIV Screening  05/03/2021*  . Mammogram  10/07/2021  . DEXA scan (bone density measurement)  11/05/2021  . Tetanus Vaccine  05/08/2028  . Colon Cancer Screening  08/19/2030  . COVID-19 Vaccine  Completed  . Pap Smear  Discontinued  *Topic was postponed. The date shown is not the original due date.   Let me know about the fosamax prescription if your pharmacy gets your regular medication back in stock   Shingrix check with your pharmacy on your insurance coverage - we can try and do a prior authorization with them to get it covered since you have had shingles x 2  Preventive Care 28-55 Years Old, Female Preventive care refers to visits with your health care provider and lifestyle choices that can promote health and wellness. This includes:  A yearly physical exam. This may also be called an annual well check.  Regular dental visits and eye exams.  Immunizations.  Screening for certain conditions.  Healthy lifestyle choices, such as eating a healthy diet, getting regular exercise, not using drugs or products that contain nicotine and tobacco, and limiting alcohol use. What can I expect for my preventive care visit? Physical exam Your health care provider will check your:  Height and weight. This may be used to calculate body mass index (BMI), which tells if you are at a healthy weight.  Heart rate and blood pressure.  Skin for abnormal spots. Counseling Your health care provider may ask you questions about your:  Alcohol, tobacco, and drug use.  Emotional well-being.  Home and relationship well-being.  Sexual activity.  Eating habits.  Work and work Statistician.  Method of birth control.  Menstrual cycle.  Pregnancy history. What immunizations do I  need?  Influenza (flu) vaccine  This is recommended every year. Tetanus, diphtheria, and pertussis (Tdap) vaccine  You may need a Td booster every 10 years. Varicella (chickenpox) vaccine  You may need this if you have not been vaccinated. Zoster (shingles) vaccine  You may need this after age 5. Measles, mumps, and rubella (MMR) vaccine  You may need at least one dose of MMR if you were born in 1957 or later. You may also need a second dose. Pneumococcal conjugate (PCV13) vaccine  You may need this if you have certain conditions and were not previously vaccinated. Pneumococcal polysaccharide (PPSV23) vaccine  You may need one or two doses if you smoke cigarettes or if you have certain conditions. Meningococcal conjugate (MenACWY) vaccine  You may need this if you have certain conditions. Hepatitis A vaccine  You may need this if you have certain conditions or if you travel or work in places where you may be exposed to hepatitis A. Hepatitis B vaccine  You may need this if you have certain conditions or if you travel or work in places where you may be exposed to hepatitis B. Haemophilus influenzae type b (Hib) vaccine  You may need this if you have certain conditions. Human papillomavirus (HPV) vaccine  If recommended by your health care provider, you may need three doses over 6 months. You may receive vaccines as individual doses or as more than one vaccine together in one shot (combination vaccines). Talk with your health care provider about the risks and benefits  of combination vaccines. What tests do I need? Blood tests  Lipid and cholesterol levels. These may be checked every 5 years, or more frequently if you are over 38 years old.  Hepatitis C test.  Hepatitis B test. Screening  Lung cancer screening. You may have this screening every year starting at age 65 if you have a 30-pack-year history of smoking and currently smoke or have quit within the past 15  years.  Colorectal cancer screening. All adults should have this screening starting at age 21 and continuing until age 91. Your health care provider may recommend screening at age 65 if you are at increased risk. You will have tests every 1-10 years, depending on your results and the type of screening test.  Diabetes screening. This is done by checking your blood sugar (glucose) after you have not eaten for a while (fasting). You may have this done every 1-3 years.  Mammogram. This may be done every 1-2 years. Talk with your health care provider about when you should start having regular mammograms. This may depend on whether you have a family history of breast cancer.  BRCA-related cancer screening. This may be done if you have a family history of breast, ovarian, tubal, or peritoneal cancers.  Pelvic exam and Pap test. This may be done every 3 years starting at age 69. Starting at age 70, this may be done every 5 years if you have a Pap test in combination with an HPV test. Other tests  Sexually transmitted disease (STD) testing.  Bone density scan. This is done to screen for osteoporosis. You may have this scan if you are at high risk for osteoporosis. Follow these instructions at home: Eating and drinking  Eat a diet that includes fresh fruits and vegetables, whole grains, lean protein, and low-fat dairy.  Take vitamin and mineral supplements as recommended by your health care provider.  Do not drink alcohol if: ? Your health care provider tells you not to drink. ? You are pregnant, may be pregnant, or are planning to become pregnant.  If you drink alcohol: ? Limit how much you have to 0-1 drink a day. ? Be aware of how much alcohol is in your drink. In the [place] one drink equals one 12 oz bottle of beer (355 mL), one 5 oz glass of wine (148 mL), or one 1 oz glass of hard liquor (44 mL). Lifestyle  Take daily care of your teeth and gums.  Stay active. Exercise for at least 30  minutes on 5 or more days each week.  Do not use any products that contain nicotine or tobacco, such as cigarettes, e-cigarettes, and chewing tobacco. If you need help quitting, ask your health care provider.  If you are sexually active, practice safe sex. Use a condom or other form of birth control (contraception) in order to prevent pregnancy and STIs (sexually transmitted infections).  If told by your health care provider, take low-dose aspirin daily starting at age 80. What's next?  Visit your health care provider once a year for a well check visit.  Ask your health care provider how often you should have your eyes and teeth checked.  Stay up to date on all vaccines. This information is not intended to replace advice given to you by your health care provider. Make sure you discuss any questions you have with your health care provider. Document Revised: 07/28/2018 Document Reviewed: 07/28/2018 Elsevier Patient Education  2020 [employer].

## 2020-11-28 LAB — COMPLETE METABOLIC PANEL WITH GFR
AG Ratio: 1.7 (calc) (ref 1.0–2.5)
ALT: 15 U/L (ref 6–29)
AST: 17 U/L (ref 10–35)
Albumin: 4.4 g/dL (ref 3.6–5.1)
Alkaline phosphatase (APISO): 56 U/L (ref 31–125)
BUN: 12 mg/dL (ref 7–25)
CO2: 28 mmol/L (ref 20–32)
Calcium: 10.3 mg/dL — ABNORMAL HIGH (ref 8.6–10.2)
Chloride: 107 mmol/L (ref 98–110)
Creat: 0.81 mg/dL (ref 0.50–1.10)
GFR, Est African American: 100 mL/min/{1.73_m2} (ref >=60)
GFR, Est Non African American: 86 mL/min/{1.73_m2} (ref >=60)
Globulin: 2.6 g/dL (calc) (ref 1.9–3.7)
Glucose, Bld: 93 mg/dL (ref 65–99)
Potassium: 5.2 mmol/L (ref 3.5–5.3)
Sodium: 143 mmol/L (ref 135–146)
Total Bilirubin: 1.2 mg/dL (ref 0.2–1.2)
Total Protein: 7 g/dL (ref 6.1–8.1)

## 2020-11-28 LAB — LIPID PANEL
Cholesterol: 175 mg/dL (ref <200)
HDL: 65 mg/dL (ref >=50)
LDL Cholesterol (Calc): 92 mg/dL (calc)
Non-HDL Cholesterol (Calc): 110 mg/dL (calc) (ref <130)
Total CHOL/HDL Ratio: 2.7 (calc) (ref <5.0)
Triglycerides: 86 mg/dL (ref <150)

## 2020-11-28 LAB — CBC WITH DIFFERENTIAL/PLATELET
Absolute Monocytes: 462 cells/uL (ref 200–950)
Basophils Absolute: 82 cells/uL (ref 0–200)
Basophils Relative: 1.2 %
Eosinophils Absolute: 177 cells/uL (ref 15–500)
Eosinophils Relative: 2.6 %
HCT: 41.7 % (ref 35.0–45.0)
Hemoglobin: 14.3 g/dL (ref 11.7–15.5)
Lymphs Abs: 2122 cells/uL (ref 850–3900)
MCH: 31.8 pg (ref 27.0–33.0)
MCHC: 34.3 g/dL (ref 32.0–36.0)
MCV: 92.9 fL (ref 80.0–100.0)
MPV: 10.5 fL (ref 7.5–12.5)
Monocytes Relative: 6.8 %
Neutro Abs: 3958 cells/uL (ref 1500–7800)
Neutrophils Relative %: 58.2 %
Platelets: 245 10*3/uL (ref 140–400)
RBC: 4.49 10*6/uL (ref 3.80–5.10)
RDW: 13.1 % (ref 11.0–15.0)
Total Lymphocyte: 31.2 %
WBC: 6.8 10*3/uL (ref 3.8–10.8)

## 2020-11-28 LAB — HEPATITIS C ANTIBODY
Hepatitis C Ab: NONREACTIVE
SIGNAL TO CUT-OFF: 0.01 (ref <1.00)

## 2020-11-28 LAB — IRON,TIBC AND FERRITIN PANEL
%SAT: 30 % (calc) (ref 16–45)
Ferritin: 23 ng/mL (ref 16–232)
Iron: 88 ug/dL (ref 40–190)
TIBC: 292 mcg/dL (calc) (ref 250–450)

## 2020-11-28 LAB — VITAMIN D 25 HYDROXY (VIT D DEFICIENCY, FRACTURES): Vit D, 25-Hydroxy: 32 ng/mL (ref 30–100)

## 2020-11-28 LAB — VITAMIN B12: Vitamin B-12: 562 pg/mL (ref 200–1100)

## 2020-11-30 HISTORY — PX: COLONOSCOPY: SHX174

## 2020-12-03 ENCOUNTER — Other Ambulatory Visit: Payer: BC Managed Care – PPO

## 2020-12-03 DIAGNOSIS — Z20822 Contact with and (suspected) exposure to covid-19: Secondary | ICD-10-CM

## 2020-12-05 ENCOUNTER — Encounter: Payer: Self-pay | Admitting: Family Medicine

## 2020-12-09 LAB — NOVEL CORONAVIRUS, NAA: SARS-CoV-2, NAA: NOT DETECTED

## 2020-12-09 LAB — SARS-COV-2, NAA 2 DAY TAT

## 2021-01-02 ENCOUNTER — Ambulatory Visit (INDEPENDENT_AMBULATORY_CARE_PROVIDER_SITE_OTHER): Payer: BC Managed Care – PPO | Admitting: Emergency Medicine

## 2021-01-02 ENCOUNTER — Other Ambulatory Visit: Payer: Self-pay

## 2021-01-02 DIAGNOSIS — E538 Deficiency of other specified B group vitamins: Secondary | ICD-10-CM

## 2021-02-10 ENCOUNTER — Encounter: Payer: Self-pay | Admitting: Gastroenterology

## 2021-02-17 ENCOUNTER — Telehealth: Payer: Self-pay | Admitting: Gastroenterology

## 2021-02-17 ENCOUNTER — Encounter: Payer: Self-pay | Admitting: Gastroenterology

## 2021-03-19 ENCOUNTER — Other Ambulatory Visit: Payer: Self-pay

## 2021-03-19 ENCOUNTER — Ambulatory Visit (AMBULATORY_SURGERY_CENTER): Payer: BC Managed Care – PPO

## 2021-03-19 VITALS — Ht 62.0 in | Wt 115.0 lb

## 2021-03-19 DIAGNOSIS — K621 Rectal polyp: Secondary | ICD-10-CM

## 2021-03-19 NOTE — Progress Notes (Signed)
Pre visit completed via phone call;  Patient verified name, DOB, and address;  No egg or soy allergy known to patient  No issues with past sedation with any surgeries or procedures Patient denies ever being told they had issues or difficulty with intubation  No FH of Malignant Hyperthermia No diet pills per patient No home 02 use per patient  No blood thinners per patient  Pt reports issues with constipation -will use Miralax 3 days prior to procedure  No A fib or A flutter  EMMI video via MyChart  COVID 19 guidelines implemented in PV today with Pt and RN  Pt is fully vaccinated  for Covid x 2 + booster; Coupon given to pt in PV today , Code to Pharmacy and  NO PA's for preps discussed with pt in PV today  Discussed with pt there will be an out-of-pocket cost for prep and that varies from $0 to 70 dollars  Due to the COVID-19 pandemic we are asking patients to follow certain guidelines.  Pt aware of COVID protocols and [facility] guidelines

## 2021-04-07 ENCOUNTER — Encounter: Payer: Self-pay | Admitting: Gastroenterology

## 2021-04-09 ENCOUNTER — Other Ambulatory Visit: Payer: Self-pay

## 2021-04-09 ENCOUNTER — Other Ambulatory Visit: Payer: Self-pay | Admitting: Gastroenterology

## 2021-04-09 ENCOUNTER — Ambulatory Visit (AMBULATORY_SURGERY_CENTER): Payer: BC Managed Care – PPO | Admitting: Gastroenterology

## 2021-04-09 ENCOUNTER — Encounter: Payer: Self-pay | Admitting: Gastroenterology

## 2021-04-09 VITALS — BP 116/72 | HR 67 | Temp 92.1°F | Resp 20 | Ht 62.0 in | Wt 115.0 lb

## 2021-04-09 DIAGNOSIS — K5140 Inflammatory polyps of colon without complications: Secondary | ICD-10-CM | POA: Diagnosis not present

## 2021-04-09 DIAGNOSIS — K621 Rectal polyp: Secondary | ICD-10-CM | POA: Diagnosis not present

## 2021-04-09 DIAGNOSIS — D128 Benign neoplasm of rectum: Secondary | ICD-10-CM

## 2021-04-09 DIAGNOSIS — D129 Benign neoplasm of anus and anal canal: Secondary | ICD-10-CM

## 2021-04-09 DIAGNOSIS — Z86010 Personal history of colonic polyps: Secondary | ICD-10-CM

## 2021-04-09 MED ORDER — SODIUM CHLORIDE 0.9 % IV SOLN: 500.0000 mL | Freq: Once | INTRAVENOUS | Status: DC

## 2021-04-09 NOTE — Progress Notes (Signed)
PT taken to PACU. Monitors in place. VSS. Report given to RN. 

## 2021-04-09 NOTE — Op Note (Addendum)
[place] Patient Name: Angela Gordon Procedure Date: 04/09/2021 1:30 PM MRN: [id number] Endoscopist: Justice Britain , MD Age: 50 Referring MD:  Date of Birth: 11-May-1971 Gender: Female Account #: [id number] Procedure:                Colonoscopy Indications:              High risk colon cancer surveillance: Personal                            history of colonic polyps, CAP Polyposis Medicines:                Monitored Anesthesia Care Procedure:                Pre-Anesthesia Assessment:                           - Prior to the procedure, a History and Physical                            was performed, and patient medications and                            allergies were reviewed. The patient's tolerance of                            previous anesthesia was also reviewed. The risks                            and benefits of the procedure and the sedation                            options and risks were discussed with the patient.                            All questions were answered, and informed consent                            was obtained. Prior Anticoagulants: The patient has                            taken no previous anticoagulant or antiplatelet                            agents. ASA Grade Assessment: II - A patient with                            mild systemic disease. After reviewing the risks                            and benefits, the patient was deemed in                            satisfactory condition to undergo the procedure.  After obtaining informed consent, the colonoscope                            was passed under direct vision. Throughout the                            procedure, the patient's blood pressure, pulse, and                            oxygen saturations were monitored continuously. The                            #[id number] was introduced through the anus and                            advanced to the the  cecum, identified by                            appendiceal orifice and ileocecal valve. After                            obtaining informed consent, the colonoscope was                            passed under direct vision. Throughout the                            procedure, the patient's blood pressure, pulse, and                            oxygen saturations were monitored continuously.The                            colonoscopy was performed without difficulty. The                            patient tolerated the procedure. The quality of the                            bowel preparation was adequate. The terminal ileum,                            ileocecal valve, appendiceal orifice, and rectum                            were photographed. Scope In: Scope Out: Findings:                 The digital rectal exam findings include                            hemorrhoids. Pertinent negatives include no                            palpable rectal lesions.  A medium-sized polypoid lesion was found in the                            distal rectum. The lesion was semi-sessile. No                            bleeding was present. Biopsies were taken with a                            cold forceps for histology.                           Normal mucosa was found in the entire colon                            otherwise.                           Non-bleeding non-thrombosed external and internal                            hemorrhoids were found during perianal exam, during                            digital exam and during endoscopy. The hemorrhoids                            were Grade II (internal hemorrhoids that prolapse                            but reduce spontaneously). Complications:            No immediate complications. Estimated Blood Loss:     Estimated blood loss was minimal. Impression:               - Hemorrhoids found on digital rectal exam.                            - Polypoid lesion in the distal rectum. Biopsied.                           - Normal mucosa in the entire examined colon                            otherwise.                           - Non-bleeding non-thrombosed external and internal                            hemorrhoids. Recommendation:           - The patient will be observed post-procedure,                            until all discharge criteria are met.                           -  Discharge patient to home.                           - Patient has a contact number available for                            emergencies. The signs and symptoms of potential                            delayed complications were discussed with the                            patient. Return to normal activities tomorrow.                            Written discharge instructions were provided to the                            patient.                           - High fiber diet.                           - Use FiberCon 1-2 tablets PO daily/Benefiber 1-2                            times daily/Metamucil 1-2 times daily.                           - Await pathology results.                           - If recurrence of CAP Polyposis, then will need to                            consider the possibility of referral to [facility]                            Surgery for further evaluation after previous ESD.                           - Repeat colonoscopy with CAP Polyposis is not                            clear for role of surveillance timing, but may                            consider repeat in 3-5 years.                           - Take Sitz baths QHS as needed.                           - May consider restart of Anusol suppositories in  future.                           - The findings and recommendations were discussed                            with the patient.                           - The findings and recommendations were  discussed                            with the patient's family. Justice Britain, MD 04/09/2021 1:59:06 PM

## 2021-04-09 NOTE — Progress Notes (Signed)
Pt's states no medical or surgical changes since previsit or office visit.  [place] vitals and AB IV.

## 2021-04-09 NOTE — Patient Instructions (Addendum)
YOU HAD AN ENDOSCOPIC PROCEDURE TODAY AT [facility]:   Refer to the procedure report that was given to you for any specific questions about what was found during the examination.  If the procedure report does not answer your questions, please call your gastroenterologist to clarify.  If you requested that your care partner not be given the details of your procedure findings, then the procedure report has been included in a sealed envelope for you to review at your convenience later.  YOU SHOULD EXPECT: Some feelings of bloating in the abdomen. Passage of more gas than usual.  Walking can help get rid of the air that was put into your GI tract during the procedure and reduce the bloating. If you had a lower endoscopy (such as a colonoscopy or flexible sigmoidoscopy) you may notice spotting of blood in your stool or on the toilet paper. If you underwent a bowel prep for your procedure, you may not have a normal bowel movement for a few days.  Please Note:  You might notice some irritation and congestion in your nose or some drainage.  This is from the oxygen used during your procedure.  There is no need for concern and it should clear up in a day or so.  SYMPTOMS TO REPORT IMMEDIATELY:   Following lower endoscopy (colonoscopy or flexible sigmoidoscopy):  Excessive amounts of blood in the stool  Significant tenderness or worsening of abdominal pains  Swelling of the abdomen that is new, acute  Fever of 100F or higher   For urgent or emergent issues, a gastroenterologist can be reached at any hour by calling [phone]. Do not use MyChart messaging for urgent concerns.    DIET:  We do recommend a small meal at first, but then you may proceed to your regular diet.  Drink plenty of fluids but you should avoid alcoholic beverages for 24 hours. Follow a High-Fiber Diet.  MEDICATIONS: Use FiberCon 1-2 tabets by mouth daily/Benefiber 1-2 times daily/Metamucil 1-2 times  daily.  Take Sitz Baths at bedtime as needed.  Please see handouts given to you by your recovery nurse.  Thank you for allowing [place] to provide for your healthcare needs today.   ACTIVITY:  You should plan to take it easy for the rest of today and you should NOT DRIVE or use heavy machinery until tomorrow (because of the sedation medicines used during the test).    FOLLOW UP: Our staff will call the number listed on your records 48-72 hours following your procedure to check on you and address any questions or concerns that you may have regarding the information given to you following your procedure. If we do not reach you, we will leave a message.  We will attempt to reach you two times.  During this call, we will ask if you have developed any symptoms of COVID 19. If you develop any symptoms (ie: fever, flu-like symptoms, shortness of breath, cough etc.) before then, please call [phone].  If you test positive for Covid 19 in the 2 weeks post procedure, please call and report this information to [place].    If any biopsies were taken you will be contacted by phone or by letter within the next 1-3 weeks.  Please call us at [phone] if you have not heard about the biopsies in 3 weeks.    SIGNATURES/CONFIDENTIALITY: You and/or your care partner have signed paperwork which will be entered into your electronic medical record.  These signatures attest to  the fact that that the information above on your After Visit Summary has been reviewed and is understood.  Full responsibility of the confidentiality of this discharge information lies with you and/or your care-partner. How to Take a [employer] A sitz bath is a warm water bath that may be used to care for your rectum, genital area, or the area between your rectum and genitals (perineum). In a sitz bath, the water only comes up to your hips and covers your buttocks. A sitz bath may be done in a bathtub or with a portable sitz bath that fits over the  toilet. Your health care provider may recommend a sitz bath to help:  Relieve pain and discomfort after delivering a baby.  Relieve pain and itching from hemorrhoids or anal fissures.  Relieve pain after certain surgeries.  Relax muscles that are sore or tight. How to take a sitz bath Take 3-4 sitz baths a day, or as many as told by your health care provider. Bathtub sitz bath To take a sitz bath in a bathtub: 1. Partially fill a bathtub with warm water. The water should be deep enough to cover your hips and buttocks when you are sitting in the tub. 2. Follow your health care provider's instructions if you are told to put medicine in the water. 3. Sit in the water. Open the tub drain a little, and leave it open during your bath. 4. Turn on the warm water again, enough to replace the water that is draining out. Keep the water running throughout your bath. This helps keep the water at the right level and temperature. 5. Soak in the water for 15-20 minutes, or as long as told by your health care provider. 6. When you are done, be careful when you stand up. You may feel dizzy. 7. After the sitz bath, pat yourself dry. Do not rub your skin to dry it.   Over-the-toilet sitz bath To take a sitz bath with an over-the-toilet basin: 1. Follow the manufacturer's instructions. 2. Fill the basin with warm water. 3. Follow your health care provider's instructions if you were told to put medicine in the water. 4. Sit on the seat. Make sure the water covers your buttocks and perineum. 5. Soak in the water for 15-20 minutes, or as long as told by your health care provider. 6. After the sitz bath, pat yourself dry. Do not rub your skin to dry it. 7. Clean and dry the basin between uses. 8. Discard the basin if it cracks, or according to the manufacturer's instructions.   Contact a health care provider if:  Your pain or itching gets worse. Do not continue with sitz baths if your symptoms get  worse.  You have new symptoms. Do not continue with sitz baths until you talk with your health care provider. Summary  A sitz bath is a warm water bath in which the water only comes up to your hips and covers your buttocks.  A sitz bath may help relieve pain and discomfort after delivering a baby. It also may help with pain and itching from hemorrhoids or anal fissures, or pain after certain surgeries. It can also help to relax muscles that are sore or tight.  Take 3-4 sitz baths a day, or as many as told by your health care provider. Soak in the water for 15-20 minutes.  Do not continue with sitz baths if your symptoms get worse. This information is not intended to replace advice given to you  by your health care provider. Make sure you discuss any questions you have with your health care provider. Document Revised: 08/01/2020 Document Reviewed: 08/01/2020 Elsevier Patient Education  2021 [employer].

## 2021-04-09 NOTE — Progress Notes (Signed)
Called to room to assist during endoscopic procedure.  Patient ID and intended procedure confirmed with present staff. Received instructions for my participation in the procedure from the performing physician.  

## 2021-04-18 ENCOUNTER — Encounter: Payer: Self-pay | Admitting: Gastroenterology

## 2021-05-28 ENCOUNTER — Ambulatory Visit: Payer: BC Managed Care – PPO | Admitting: Family Medicine

## 2021-06-04 ENCOUNTER — Other Ambulatory Visit: Payer: Self-pay

## 2021-06-04 DIAGNOSIS — K621 Rectal polyp: Secondary | ICD-10-CM

## 2021-06-04 DIAGNOSIS — K625 Hemorrhage of anus and rectum: Secondary | ICD-10-CM

## 2021-06-05 ENCOUNTER — Other Ambulatory Visit (INDEPENDENT_AMBULATORY_CARE_PROVIDER_SITE_OTHER): Payer: BC Managed Care – PPO

## 2021-06-05 DIAGNOSIS — K625 Hemorrhage of anus and rectum: Secondary | ICD-10-CM

## 2021-06-05 LAB — CBC WITH DIFFERENTIAL/PLATELET
Basophils Absolute: 0.1 10*3/uL (ref 0.0–0.1)
Basophils Relative: 1 % (ref 0.0–3.0)
Eosinophils Absolute: 0.3 10*3/uL (ref 0.0–0.7)
Eosinophils Relative: 4.1 % (ref 0.0–5.0)
HCT: 41.9 % (ref 36.0–46.0)
Hemoglobin: 14.3 g/dL (ref 12.0–15.0)
Lymphocytes Relative: 27.3 % (ref 12.0–46.0)
Lymphs Abs: 1.9 10*3/uL (ref 0.7–4.0)
MCHC: 34.1 g/dL (ref 30.0–36.0)
MCV: 92.7 fl (ref 78.0–100.0)
Monocytes Absolute: 0.5 10*3/uL (ref 0.1–1.0)
Monocytes Relative: 6.5 % (ref 3.0–12.0)
Neutro Abs: 4.3 10*3/uL (ref 1.4–7.7)
Neutrophils Relative %: 61.1 % (ref 43.0–77.0)
Platelets: 240 10*3/uL (ref 150.0–400.0)
RBC: 4.52 Mil/uL (ref 3.87–5.11)
RDW: 12.8 % (ref 11.5–15.5)
WBC: 7 10*3/uL (ref 4.0–10.5)

## 2021-07-07 ENCOUNTER — Encounter: Payer: Self-pay | Admitting: Family Medicine

## 2021-07-07 ENCOUNTER — Other Ambulatory Visit: Payer: Self-pay | Admitting: Family Medicine

## 2021-07-07 DIAGNOSIS — M545 Low back pain, unspecified: Secondary | ICD-10-CM

## 2021-07-08 MED ORDER — TIZANIDINE HCL 4 MG PO TABS: 4.0000 mg | ORAL_TABLET | Freq: Three times a day (TID) | ORAL | 0 refills | Status: DC | PRN

## 2021-07-30 ENCOUNTER — Other Ambulatory Visit: Payer: Self-pay | Admitting: Family Medicine

## 2021-07-30 DIAGNOSIS — M545 Low back pain, unspecified: Secondary | ICD-10-CM

## 2021-08-09 ENCOUNTER — Other Ambulatory Visit: Payer: Self-pay | Admitting: Gastroenterology

## 2021-09-03 IMAGING — CR DG HIP (WITH OR WITHOUT PELVIS) 2-3V*R*
1 series · 3 of 3 positions shown · non-contrast
Comparison: 08/15/2019

CLINICAL DATA: Right hip pain and groin pain for 2 days

EXAM:
DG HIP (WITH OR WITHOUT PELVIS) 3V RIGHT

[Series 1: dg hip unilat w or w/o pelvis 2-3 views  · non-contrast · 0.14mm/px · 3 of 3 slices shown]
[im 1/3]
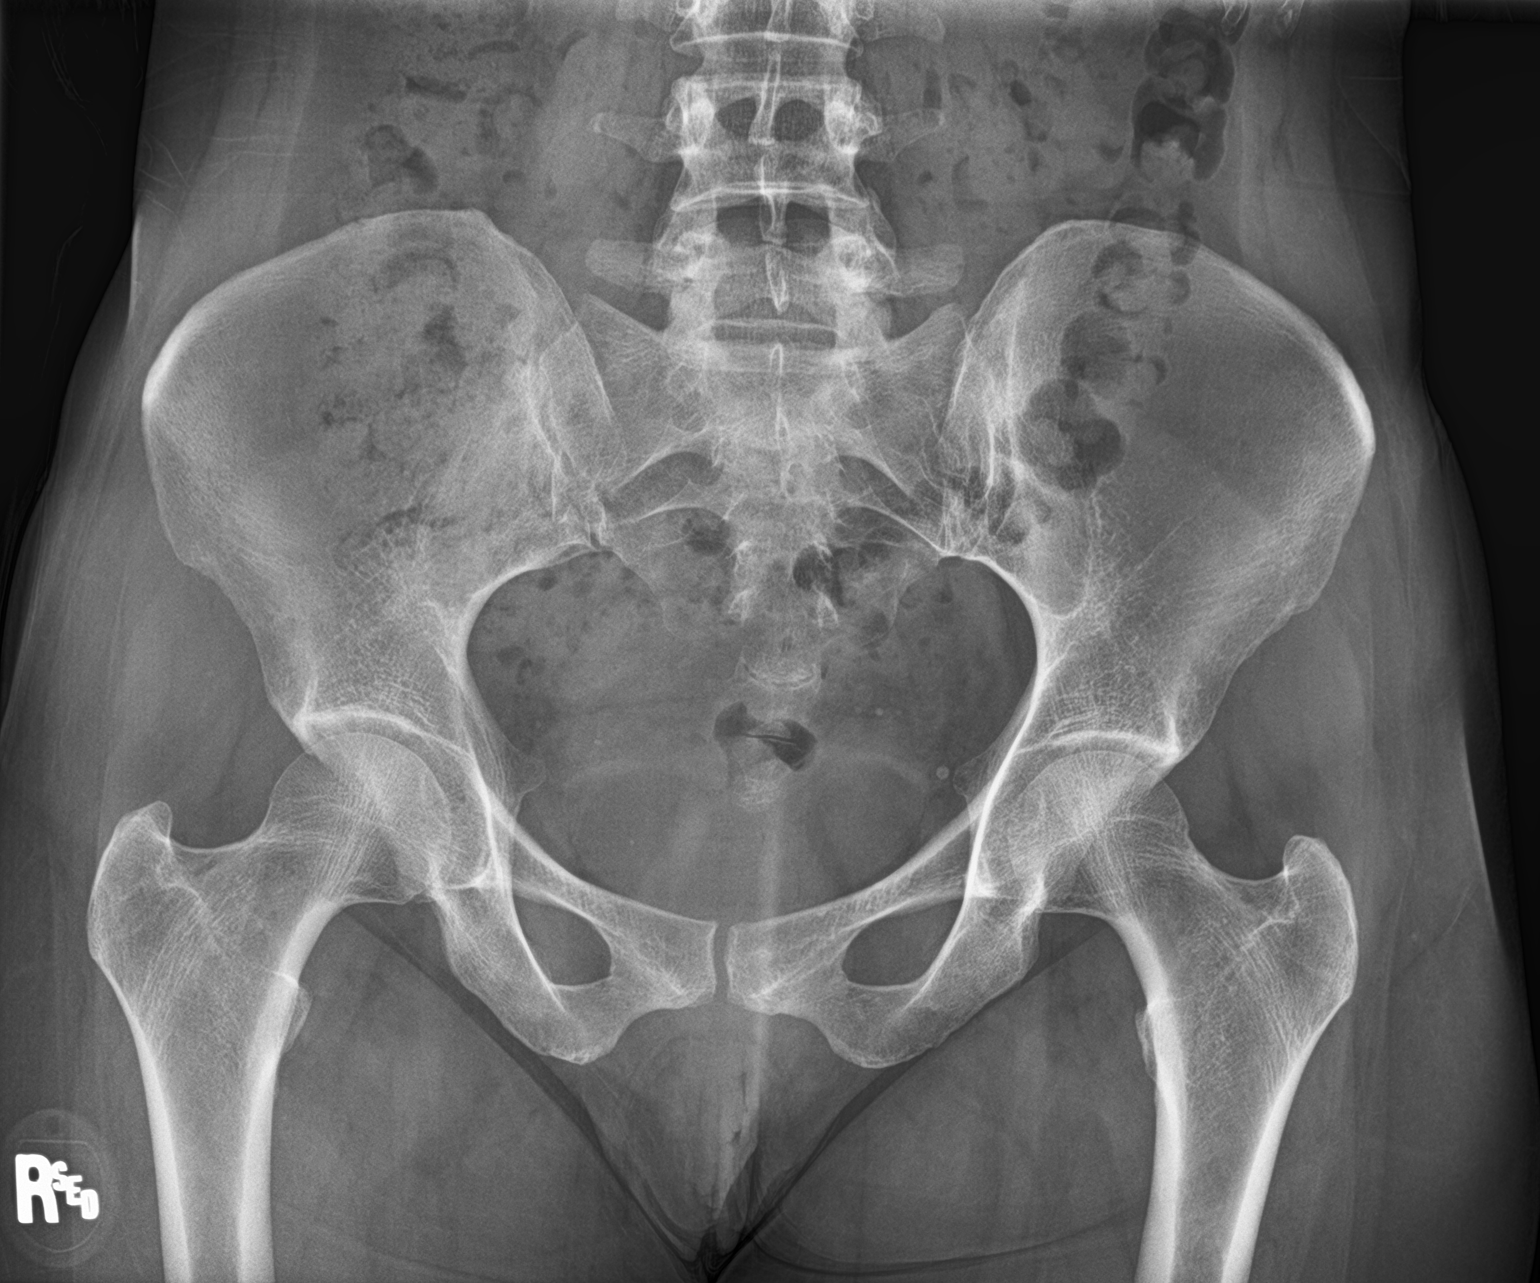
[im 2/3]
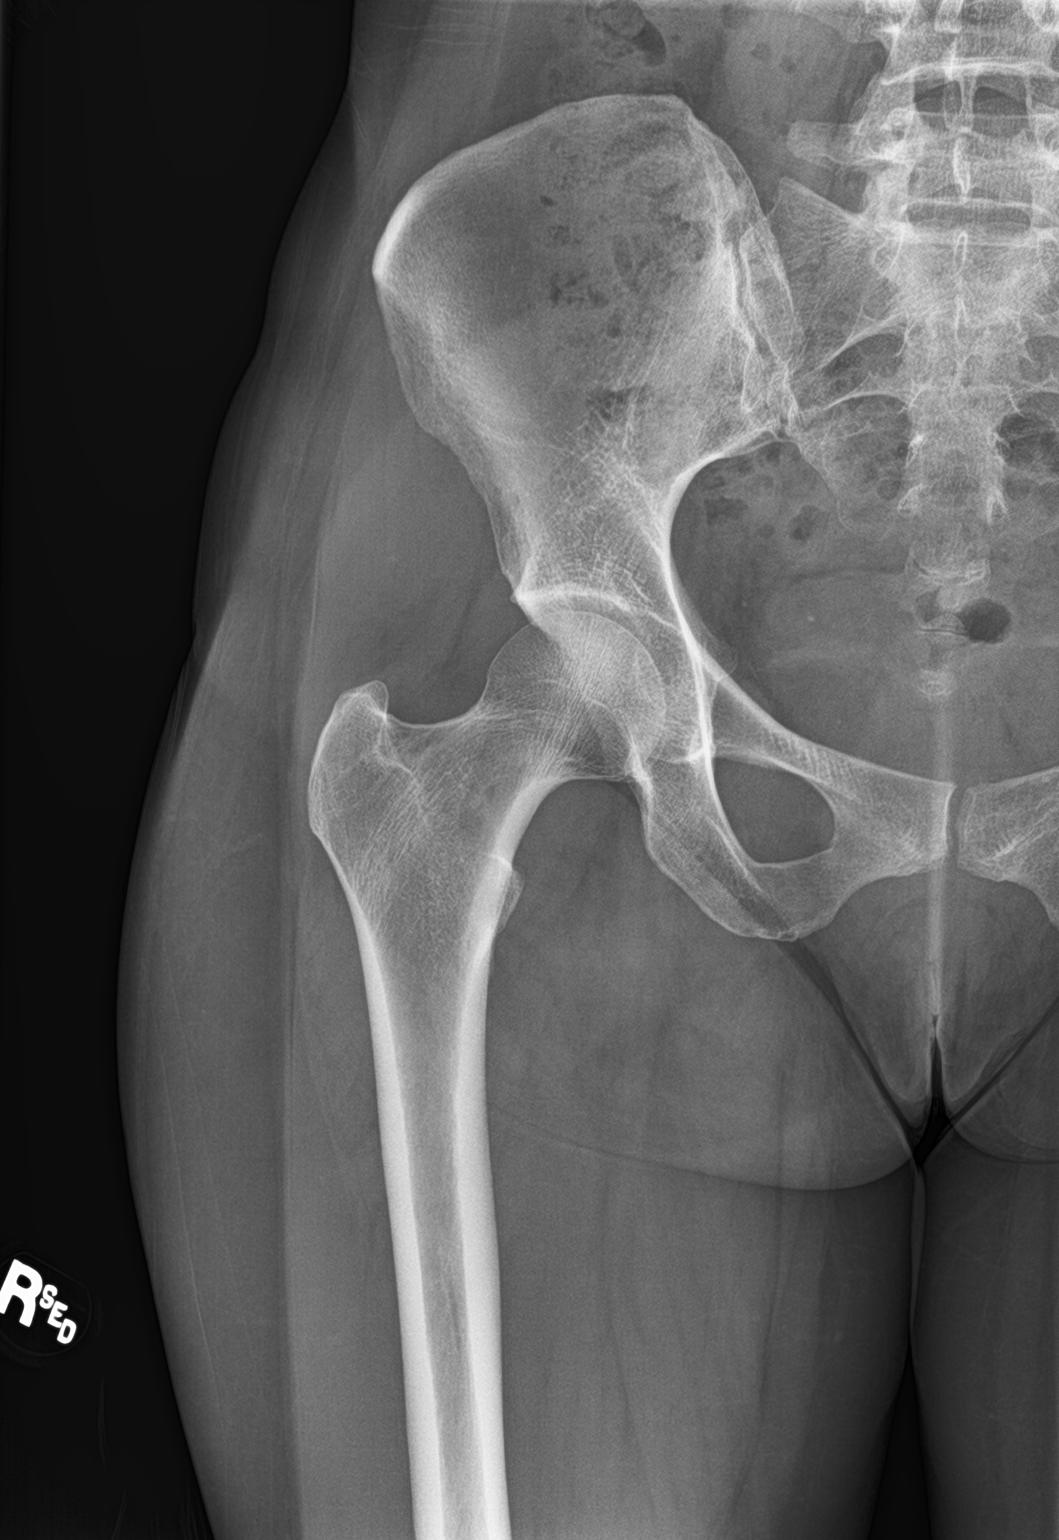
[im 3/3]
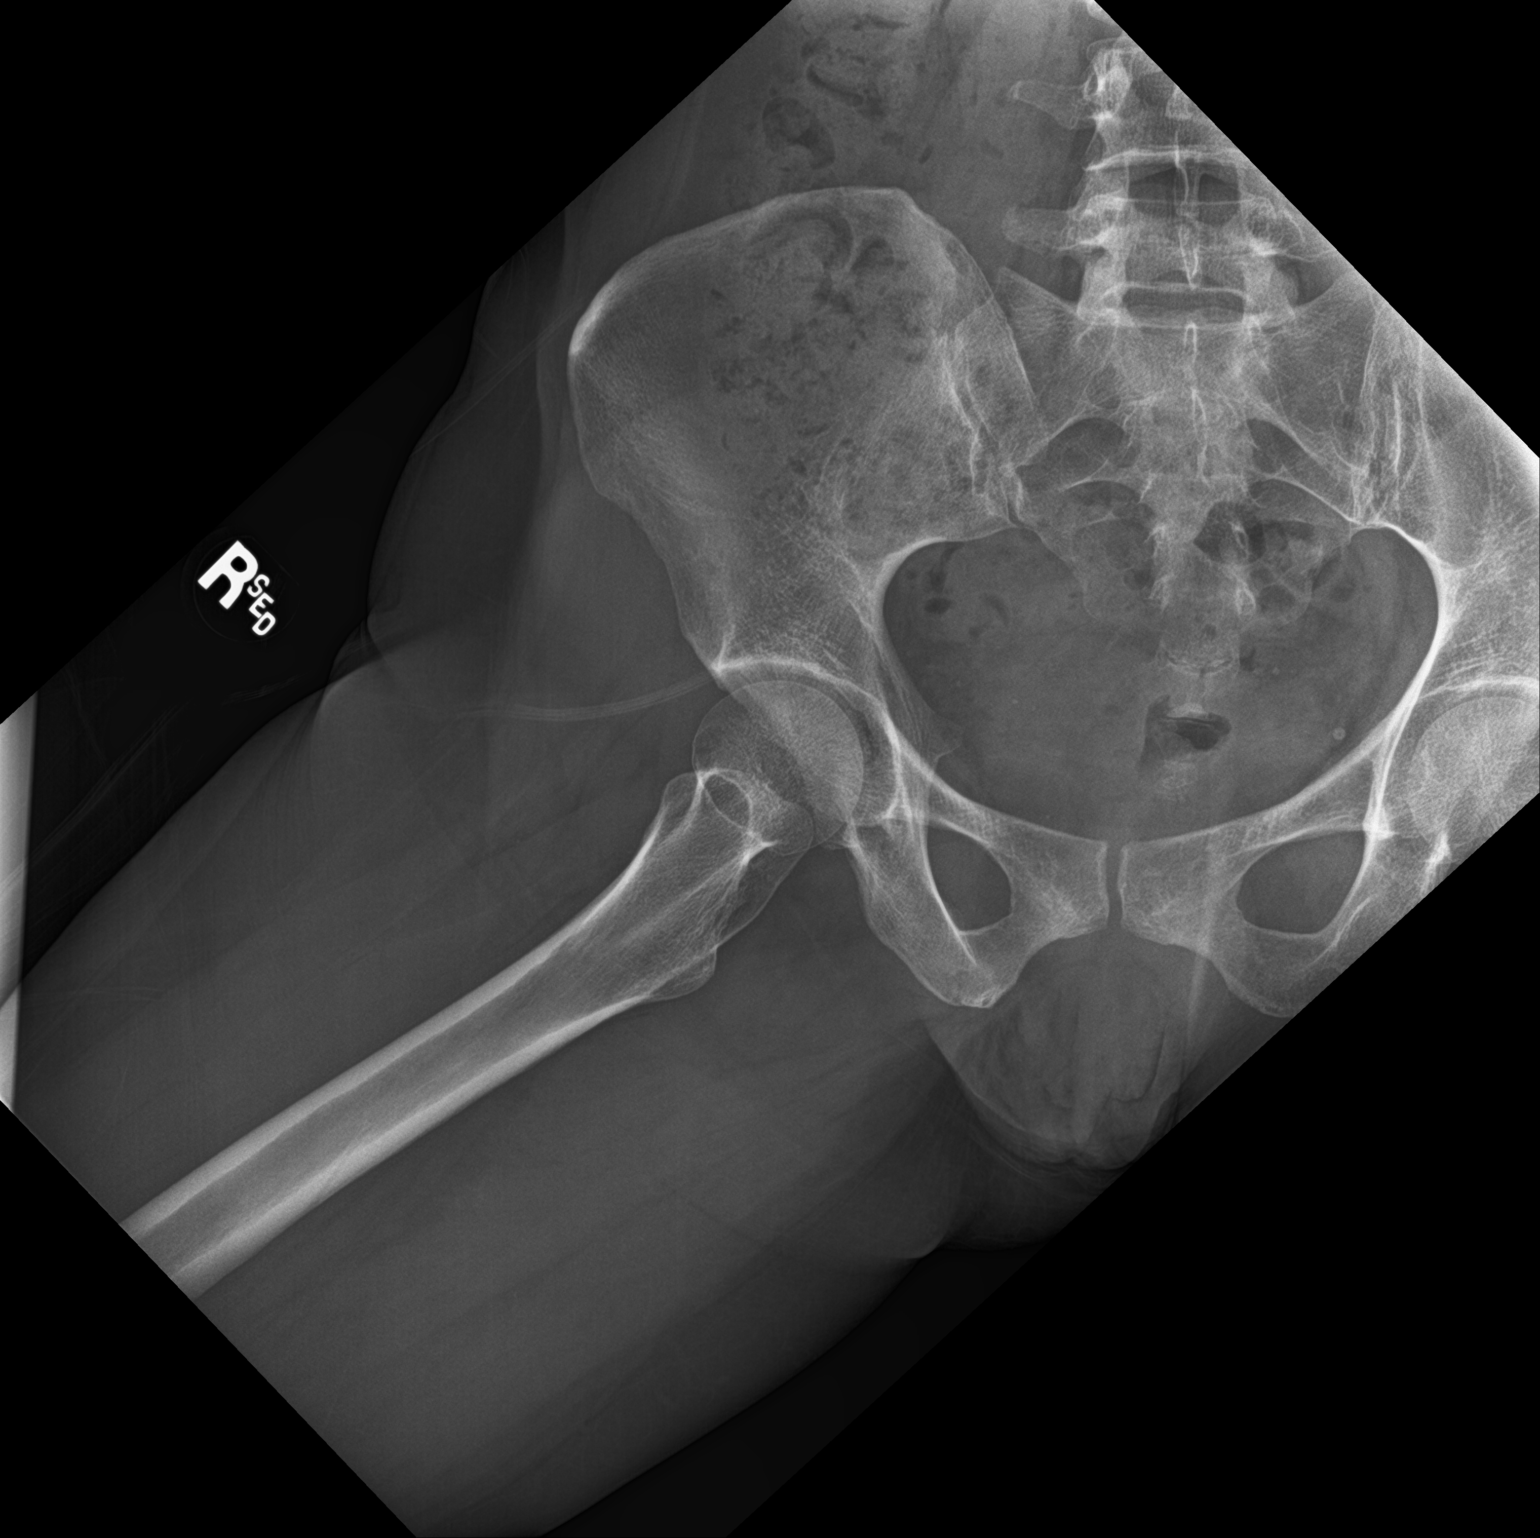

[3 of 3 positions shown; findings below may reference images not displayed]

FINDINGS: Pelvic ring is intact. No acute fracture or dislocation is noted. No
soft tissue abnormality is seen.
IMPRESSION: No acute abnormality noted.

## 2021-10-21 ENCOUNTER — Other Ambulatory Visit: Payer: Self-pay | Admitting: Family Medicine

## 2021-10-21 ENCOUNTER — Other Ambulatory Visit (HOSPITAL_COMMUNITY): Payer: Self-pay | Admitting: Family Medicine

## 2021-10-21 DIAGNOSIS — Z1231 Encounter for screening mammogram for malignant neoplasm of breast: Secondary | ICD-10-CM

## 2021-10-27 ENCOUNTER — Other Ambulatory Visit: Payer: Self-pay

## 2021-10-27 ENCOUNTER — Ambulatory Visit (HOSPITAL_COMMUNITY)
Admission: RE | Admit: 2021-10-27 | Discharge: 2021-10-27 | Disposition: A | Discharge status: Home / Self Care | Payer: BC Managed Care – PPO | Source: Ambulatory Visit | Attending: Family Medicine | Admitting: Family Medicine

## 2021-10-27 DIAGNOSIS — N6310 Unspecified lump in the right breast, unspecified quadrant: Secondary | ICD-10-CM | POA: Diagnosis not present

## 2021-10-27 DIAGNOSIS — Z1231 Encounter for screening mammogram for malignant neoplasm of breast: Secondary | ICD-10-CM | POA: Diagnosis present

## 2021-10-28 ENCOUNTER — Other Ambulatory Visit (HOSPITAL_COMMUNITY): Payer: Self-pay | Admitting: Family Medicine

## 2021-10-28 DIAGNOSIS — R928 Other abnormal and inconclusive findings on diagnostic imaging of breast: Secondary | ICD-10-CM

## 2021-10-29 ENCOUNTER — Ambulatory Visit (HOSPITAL_COMMUNITY)
Admission: RE | Admit: 2021-10-29 | Discharge: 2021-10-29 | Disposition: A | Discharge status: Home / Self Care | Payer: BC Managed Care – PPO | Source: Ambulatory Visit | Attending: Family Medicine | Admitting: Family Medicine

## 2021-10-29 ENCOUNTER — Encounter: Payer: Self-pay | Admitting: Family Medicine

## 2021-10-29 ENCOUNTER — Other Ambulatory Visit: Payer: Self-pay

## 2021-10-29 DIAGNOSIS — R928 Other abnormal and inconclusive findings on diagnostic imaging of breast: Secondary | ICD-10-CM

## 2021-10-29 DIAGNOSIS — K529 Noninfective gastroenteritis and colitis, unspecified: Secondary | ICD-10-CM

## 2021-10-29 DIAGNOSIS — K5904 Chronic idiopathic constipation: Secondary | ICD-10-CM

## 2021-10-29 MED ORDER — POLYETHYLENE GLYCOL 3350 17 GM/SCOOP PO POWD: 17.0000 g | Freq: Two times a day (BID) | ORAL | 0 refills | Status: DC | PRN

## 2021-10-31 NOTE — Telephone Encounter (Signed)
Called pt and she said that it has been taken care of and that she has an appt at the end of the month of DEC

## 2021-11-01 ENCOUNTER — Other Ambulatory Visit: Payer: Self-pay | Admitting: Family Medicine

## 2021-11-01 DIAGNOSIS — M545 Low back pain, unspecified: Secondary | ICD-10-CM

## 2021-11-02 NOTE — Telephone Encounter (Signed)
Requested medication (s) are due for refill today: yes  Requested medication (s) are on the active medication list: yes  Last refill:  07/08/21 #90  Future visit scheduled: yes  Notes to clinic:  med not delegated to NT to RF   Requested Prescriptions  Pending Prescriptions Disp Refills   tiZANidine (ZANAFLEX) 4 MG tablet [Pharmacy Med Name: TIZANIDINE HCL 4 MG TABLET] 90 tablet 0    Sig: Take 1 tablet (4 mg total) by mouth every 8 (eight) hours as needed for muscle spasms.     Not Delegated - Cardiovascular:  Alpha-2 Agonists - tizanidine Failed - 11/01/2021 10:10 AM      Failed - This refill cannot be delegated      Failed - Valid encounter within last 6 months    Recent Outpatient Visits           11 months ago Adult general medical exam   [facility] Delsa Grana, [name]   12 months ago Nonintractable headache, unspecified chronicity pattern, unspecified headache type   [facility] Delsa Grana, [name]   1 year ago Right hip pain   [facility] Delsa Grana, [name]   1 year ago Right hip pain   [facility] Delsa Grana, [name]   1 year ago IBD (inflammatory bowel disease)   [facility] Delsa Grana, [name]       Future Appointments             In 3 weeks Delsa Grana, [name] [facility]

## 2021-11-24 ENCOUNTER — Emergency Department
Admission: EM | Admit: 2021-11-24 | Discharge: 2021-11-24 | Disposition: A | Discharge status: Home / Self Care | Payer: BC Managed Care – PPO | Attending: Emergency Medicine | Admitting: Emergency Medicine

## 2021-11-24 ENCOUNTER — Other Ambulatory Visit: Payer: Self-pay

## 2021-11-24 DIAGNOSIS — J45909 Unspecified asthma, uncomplicated: Secondary | ICD-10-CM | POA: Insufficient documentation

## 2021-11-24 DIAGNOSIS — M546 Pain in thoracic spine: Secondary | ICD-10-CM | POA: Diagnosis present

## 2021-11-24 DIAGNOSIS — M62838 Other muscle spasm: Secondary | ICD-10-CM | POA: Diagnosis not present

## 2021-11-24 DIAGNOSIS — M549 Dorsalgia, unspecified: Secondary | ICD-10-CM

## 2021-11-24 DIAGNOSIS — M25511 Pain in right shoulder: Secondary | ICD-10-CM | POA: Insufficient documentation

## 2021-11-24 MED ORDER — METHYLPREDNISOLONE 4 MG PO TBPK: ORAL_TABLET | ORAL | 0 refills | Status: DC

## 2021-11-24 MED ORDER — OXYCODONE-ACETAMINOPHEN 5-325 MG PO TABS: 1.0000 | ORAL_TABLET | ORAL | 0 refills | Status: AC | PRN

## 2021-11-24 MED ORDER — OXYCODONE-ACETAMINOPHEN 5-325 MG PO TABS
1.0000 | ORAL_TABLET | Freq: Once | ORAL | Status: AC
  Administered 2021-11-24: 14:00:00 1 via ORAL
  Filled 2021-11-24: qty 1

## 2021-11-24 MED ORDER — CYCLOBENZAPRINE HCL 10 MG PO TABS: 10.0000 mg | ORAL_TABLET | Freq: Three times a day (TID) | ORAL | 0 refills | Status: DC | PRN

## 2021-11-24 NOTE — Discharge Instructions (Signed)
Use wet heat to loosen your muscles, apply ice to the bony areas to decrease inflammation after using wet heat. Use the medication as prescribed.  Do not take your regular muscle relaxer along with the Flexeril. Follow-up with [facility] if not improving in 3 to 4 days.  Consider getting a massage. Return emergency department worsening

## 2021-11-24 NOTE — ED Provider Notes (Signed)
[facility] Emergency Department Provider Note  ____________________________________________   Event Date/Time   First MD Initiated Contact with Patient 11/24/21 1324     (approximate)  I have reviewed the triage vital signs and the nursing notes.   HISTORY  Chief Complaint Shoulder Pain    HPI Angela Gordon is a 50 y.o. female presents emergency department complaint of right shoulder and upper back pain.  Patient states pain has been ongoing since Saturday morning.  States she did go grocery shopping and then did a lot of dicing and chopping that night.  Took a muscle relaxer that she had at home without any relief.  Patient is unable to take ibuprofen.  States she did take Tylenol which did not help.  States her husband tried to massage the area and has not helped either  Past Medical History:  Diagnosis Date   Anemia    Asthma    Uses rescue inhaler occasionally.   Complication of anesthesia    slow to wake up   Family history of adverse reaction to anesthesia    Sister- Cardiac arrest during emergency c-section   Headache    occasional - otc med prn   Heart murmur    was told she had MVP when she was around 24 years, she states she never followed up with that and has never had any issues from it   History of blood transfusion 1992   Blood Loss from Toxemia of Pregnancy   Hypoglycemia    Osteoporosis    Strong FHx of Osteoporosis.   PONV (postoperative nausea and vomiting)    Scoliosis    Seen Dr. Jefm Bryant   Sickle-cell trait [facility])    Stroke [facility]) 1992   Toxemia of Pregnancy     Patient Active Problem List   Diagnosis Date Noted   Pure hypercholesterolemia 11/27/2020   Cap polyposis ([place]) 07/27/2020   Abnormal colonoscopy 07/27/2020   History of anemia 07/27/2020   Constipation 07/27/2020   Unintentional weight loss 07/27/2020   Intestinal malabsorption 05/31/2020   Hypocalcemia 05/31/2020   Anemia 05/31/2020   B12 deficiency  05/31/2020   Protein-calorie malnutrition ([place]) 05/31/2020   IBD (inflammatory bowel disease) 05/31/2020   Rectal polyp 01/11/2020   Rectal bleeding 01/11/2020   Encounter for screening colonoscopy    Rectal mass    Osteoporosis 10/06/2019   Family history of ovarian cancer 07/26/2017   Vitamin D deficiency 05/07/2016   Sickle-cell trait (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;  Service: Gastroenterology;;   BREAST EXCISIONAL BIOPSY Left 2003   neg bx dr Jamal Collin   BREAST SURGERY Left 2004   biopsy, benign   COLONOSCOPY N/A 08/19/2020   Procedure: COLONOSCOPY;  Surgeon: Irving Copas., MD;  Location: [facility] ENDOSCOPY;  Service: Gastroenterology;  Laterality: N/A;   COLONOSCOPY W/ BIOPSIES  05/09/2020   [facility] - removed a "Cap Polyp" per patient   COLONOSCOPY WITH PROPOFOL N/A 11/29/2019   Procedure: COLONOSCOPY WITH PROPOFOL;  Surgeon: Virgel Manifold, MD;  Location: [facility] ENDOSCOPY;  Service: Endoscopy;  Laterality: N/A;   COLONOSCOPY WITH PROPOFOL N/A 02/26/2020  Procedure: COLONOSCOPY WITH PROPOFOL;  Surgeon: Mansouraty, Telford Nab., MD;  Location: [place];  Service: Gastroenterology;  Laterality: N/A;   CYST REMOVAL HAND Right 2005-06   ESOPHAGOGASTRODUODENOSCOPY (EGD) WITH PROPOFOL N/A 08/19/2020   Procedure: ESOPHAGOGASTRODUODENOSCOPY (EGD) WITH PROPOFOL;  Surgeon: Rush Landmark Telford Nab., MD;  Location: [place];  Service: Gastroenterology;  Laterality: N/A;   EUS  02/26/2020   Procedure: LOWER ENDOSCOPIC ULTRASOUND (EUS);  Surgeon: Irving Copas., MD;  Location: [place];   Service: Gastroenterology;;   Otho Darner SIGMOIDOSCOPY  05/17/20, 05/19/20   x 2 at [place] Right 10/16/2020   Procedure: DOUBLE OSTEOTOMY RIGHT;  Surgeon: [name], DPM;  Location: [place];  Service: Podiatry;  Laterality: Right;   HERNIA REPAIR  [id number]   umbilical   TONSILLECTOMY  1978    Prior to Admission medications   Medication Sig Start Date End Date Taking? Authorizing Provider  cyclobenzaprine (FLEXERIL) 10 MG tablet Take 1 tablet (10 mg total) by mouth 3 (three) times daily as needed. 11/24/21  Yes Tayvon Culley, Linden Dolin, [name]  methylPREDNISolone (MEDROL DOSEPAK) 4 MG TBPK tablet Take 6 pills on day one then decrease by 1 pill each day 11/24/21  Yes Mandee Pluta, Linden Dolin, [name]  oxyCODONE-acetaminophen (PERCOCET) 5-325 MG tablet Take 1 tablet by mouth every 4 (four) hours as needed for severe pain. 11/24/21 11/24/22 Yes Feven Alderfer, Linden Dolin, [name]  acetaminophen (TYLENOL) 325 MG tablet Take 325-650 mg by mouth every 6 (six) hours as needed for moderate pain.    [provider]  albuterol (VENTOLIN HFA) 108 (90 Base) MCG/ACT inhaler Inhale 2 puffs into the lungs every 6 (six) hours as needed for wheezing or shortness of breath.    [provider]  alendronate (FOSAMAX) 70 MG tablet Take 1 tablet (70 mg total) by mouth every 7 (seven) days. Take with a full glass of water on an empty stomach. 11/27/20   Delsa Grana, [name]  calcium carbonate (OSCAL) 1500 (600 Ca) MG TABS tablet Take 600 mg of elemental calcium by mouth 2 (two) times daily.    [provider]  Cholecalciferol (VITAMIN D3) 125 MCG (5000 UT) TABS Take 5,000 Units by mouth every Monday.     [provider]  cyanocobalamin 2000 MCG tablet Take 2,000 mcg by mouth daily.    [provider]  diphenhydrAMINE (BENADRYL) 25 MG tablet Take 25 mg by mouth daily as needed for allergies.    [provider]  ferrous gluconate (FERGON) 324 MG tablet TAKE 1 TABLET  BY MOUTH DAILY WITH BREAKFAST 10/21/20   Mansouraty, Telford Nab., MD  hydrocortisone 2.5 % cream Apply topically 2 (two) times daily. 09/26/21   [provider]  omeprazole (PRILOSEC) 20 MG capsule Take 1 capsule (20 mg total) by mouth daily for 30 days, THEN 1 capsule (20 mg total) daily. 08/20/20 10/19/20  Mansouraty, Telford Nab., MD  polyethylene glycol powder (GLYCOLAX/MIRALAX) 17 GM/SCOOP powder Take 17 g by mouth 2 (two) times daily as needed. 10/29/21   Steele Sizer, MD  promethazine (PHENERGAN) 25 MG tablet Take 0.5-1 tablets (12.5-25 mg total) by mouth every 6 (six) hours as needed for nausea or vomiting (headaches). 11/06/20   Delsa Grana, [name]  tiZANidine (ZANAFLEX) 4 MG tablet Take 1 tablet (4 mg total) by mouth every 8 (eight) hours as needed for muscle spasms. 07/08/21   Delsa Grana, [name]    Allergies Erythromycin base, Penicillins, and Scallops [shellfish allergy]  Family History  Problem Relation Age  of Onset   Hypertension Mother    Osteoporosis Mother    Cervical cancer Mother        Hysterectomy in 110s.   Stroke Mother    Crohn's disease Mother    Irritable bowel syndrome Mother    Sickle cell trait Father    Sickle cell trait Sister    Heart disease Maternal Aunt    Breast cancer Maternal Aunt        2 mat aunts   Heart disease Maternal Uncle    Hypertension Maternal Uncle    Ovarian cancer Paternal Aunt 70   Breast cancer Paternal Aunt        pat great aunt   Heart disease Maternal Grandmother    Hypertension Maternal Grandmother    Osteoporosis Maternal Grandmother    Rheum arthritis Maternal Grandfather    Hypertension Maternal Grandfather    Prostate cancer Maternal Grandfather    Leukemia Cousin    Colon cancer Neg Hx    Esophageal cancer Neg Hx    Pancreatic cancer Neg Hx    Stomach cancer Neg Hx    Inflammatory bowel disease Neg Hx    Liver disease Neg Hx    Rectal cancer Neg Hx    Colon polyps Neg Hx     Social History Social  History   Tobacco Use   Smoking status: Never   Smokeless tobacco: Never  Vaping Use   Vaping Use: Never used  Substance Use Topics   Alcohol use: Not Currently    Alcohol/week: 1.0 standard drink    Types: 1 Standard drinks or equivalent per week   Drug use: No    Review of Systems  Constitutional: No fever/chills Eyes: No visual changes. ENT: No sore throat. Respiratory: Denies cough Cardiovascular: Denies chest pain Gastrointestinal: Denies abdominal pain Genitourinary: Negative for dysuria. Musculoskeletal: Positive for upper back pain. Skin: Negative for rash. Psychiatric: no mood changes,     ____________________________________________   PHYSICAL EXAM:  VITAL SIGNS: ED Triage Vitals [11/24/21 1121]  Enc Vitals Group     BP (!) 136/98     Pulse Rate 66     Resp 16     Temp 97.8 F (36.6 C)     Temp Source Oral     SpO2 98 %     Weight 116 lb (52.6 kg)     Height 5\' 2"  (1.575 m)     Head Circumference      Peak Flow      Pain Score 10     Pain Loc      Pain Edu?      Excl. in [place]?     Constitutional: Alert and oriented. Well appearing and in no acute distress. Eyes: Conjunctivae are normal.  Head: Atraumatic. Nose: No congestion/rhinnorhea. Mouth/Throat: Mucous membranes are moist.   Neck:  supple no lymphadenopathy noted Cardiovascular: Normal rate, regular rhythm.  Respiratory: Normal respiratory effort.  No retractions,  GU: deferred Musculoskeletal: Spasm noted along the trapezius and into the levator scapula and in lats, scapular bursa is tender to palpation, joint of the right shoulder is not tender, C-spine is nontender, neurovascular is intact Neurologic:  Normal speech and language.  Skin:  Skin is warm, dry and intact. No rash noted. Psychiatric: Mood and affect are normal. Speech and behavior are normal.  ____________________________________________   LABS (all labs ordered are listed, but only abnormal results are  displayed)  Labs Reviewed - No data to display ____________________________________________   ____________________________________________  RADIOLOGY    ____________________________________________   PROCEDURES  Procedure(s) performed: No  Procedures    ____________________________________________   INITIAL IMPRESSION / ASSESSMENT AND PLAN / ED COURSE  Pertinent labs & imaging results that were available during my care of the patient were reviewed by me and considered in my medical decision making (see chart for details).   The patient is a 50 year old female presents emergency department with upper back pain left scapular pain.  See HPI.  Physical exam shows a trapezius, levator scapula, bursa of the scapula, and the lats to be tender and spasm.  Muscle would actually roll in a spasm underneath the scapula.  I did explain the findings to the patient and her husband.  I do not feel that she needs x-rays as this is more muscle spasm.  She was given a prescription for Flexeril, Medrol Dosepak, and Percocet for breakthrough pain.  She is not to take her regular muscle relaxer with the Flexeril.  She is to use wet heat and then apply ice to the bony prominences.  Follow-up with a massage therapist.  Follow-up with orthopedics if not improving in 1 week.  Patient is in agreement treatment plan.  Discharged in stable condition.     CHANTRELL APSEY was evaluated in Emergency Department on 11/24/2021 for the symptoms described in the history of present illness. She was evaluated in the context of the global COVID-19 pandemic, which necessitated consideration that the patient might be at risk for infection with the SARS-CoV-2 virus that causes COVID-19. Institutional protocols and algorithms that pertain to the evaluation of patients at risk for COVID-19 are in a state of rapid change based on information released by regulatory bodies including the CDC and federal and state organizations.  These policies and algorithms were followed during the patient's care in the ED.    As part of my medical decision making, I reviewed the following data within the [place] notes reviewed and incorporated, Old chart reviewed, Notes from prior ED visits, and [place] Controlled Substance Database  ____________________________________________   FINAL CLINICAL IMPRESSION(S) / ED DIAGNOSES  Final diagnoses:  Acute upper back pain  Muscle spasm      NEW MEDICATIONS STARTED DURING THIS VISIT:  New Prescriptions   CYCLOBENZAPRINE (FLEXERIL) 10 MG TABLET    Take 1 tablet (10 mg total) by mouth 3 (three) times daily as needed.   METHYLPREDNISOLONE (MEDROL DOSEPAK) 4 MG TBPK TABLET    Take 6 pills on day one then decrease by 1 pill each day   OXYCODONE-ACETAMINOPHEN (PERCOCET) 5-325 MG TABLET    Take 1 tablet by mouth every 4 (four) hours as needed for severe pain.     Note:  This document was prepared using Dragon voice recognition software and may include unintentional dictation errors.    Versie Starks, [name] 11/24/21 1430    Nena Polio, MD 11/24/21 [phone]

## 2021-11-24 NOTE — ED Triage Notes (Signed)
Pt c/o right shoulder pain since Saturday morning, states she is not sure if she injured it shopping on friday

## 2021-11-28 ENCOUNTER — Encounter: Payer: BC Managed Care – PPO | Admitting: Nurse Practitioner

## 2021-12-03 ENCOUNTER — Ambulatory Visit (INDEPENDENT_AMBULATORY_CARE_PROVIDER_SITE_OTHER): Payer: BC Managed Care – PPO | Admitting: Nurse Practitioner

## 2021-12-03 ENCOUNTER — Other Ambulatory Visit: Payer: Self-pay

## 2021-12-03 ENCOUNTER — Encounter: Payer: Self-pay | Admitting: Nurse Practitioner

## 2021-12-03 VITALS — BP 120/72 | HR 78 | Temp 98.0°F | Resp 16 | Ht 62.0 in | Wt 114.8 lb

## 2021-12-03 DIAGNOSIS — Z23 Encounter for immunization: Secondary | ICD-10-CM | POA: Diagnosis not present

## 2021-12-03 DIAGNOSIS — D649 Anemia, unspecified: Secondary | ICD-10-CM | POA: Diagnosis not present

## 2021-12-03 DIAGNOSIS — E7800 Pure hypercholesterolemia, unspecified: Secondary | ICD-10-CM

## 2021-12-03 DIAGNOSIS — Z0000 Encounter for general adult medical examination without abnormal findings: Secondary | ICD-10-CM | POA: Diagnosis not present

## 2021-12-03 DIAGNOSIS — M810 Age-related osteoporosis without current pathological fracture: Secondary | ICD-10-CM

## 2021-12-03 NOTE — Progress Notes (Signed)
Name: Angela Gordon   MRN: [id number]    DOB: 10-08-1971   Date:12/03/2021       Progress Note  Subjective  Chief Complaint  Chief Complaint  Patient presents with   Annual Exam    HPI  Patient presents for annual CPE, here alone  Diet: well balanced diet including vegetables, meat, candy, and dairy Exercise: walk/ jog, instacart delivery, discussed recommendations of 150 minutes of physical activity a week.   [place] [facility] Visit from 12/03/2021 in [facility]  AUDIT-C Score 0      Depression: Phq 9 is  negative Depression screen [facility] 2/9 12/03/2021 11/27/2020 11/06/2020 08/30/2020 08/13/2020  Decreased Interest 0 0 0 0 0  Down, Depressed, Hopeless 0 0 0 0 0  PHQ - 2 Score 0 0 0 0 0  Altered sleeping 0 - - - -  Tired, decreased energy 0 - - - -  Change in appetite 0 - - - -  Feeling bad or failure about yourself  0 - - - -  Trouble concentrating 0 - - - -  Moving slowly or fidgety/restless 0 - - - -  Suicidal thoughts 0 - - - -  PHQ-9 Score 0 - - - -  Difficult doing work/chores Not difficult at all - - - -   Hypertension: BP Readings from Last 3 Encounters:  12/03/21 120/72  11/24/21 (!) 136/98  04/09/21 116/72   Obesity: Wt Readings from Last 3 Encounters:  12/03/21 114 lb 12.8 oz (52.1 kg)  11/24/21 116 lb (52.6 kg)  04/09/21 115 lb (52.2 kg)   BMI Readings from Last 3 Encounters:  12/03/21 21.00 kg/m  11/24/21 21.22 kg/m  04/09/21 21.03 kg/m     Vaccines:  HPV: up to at age 33 , ask insurance if age between 5-45  Shingrix: 20-64 yo and ask insurance if covered when patient above 42 yo Pneumonia:  educated and discussed with patient. Flu:  educated and discussed with patient.  Hep C Screening: 11/27/20 STD testing and prevention (HIV/chl/gon/syphilis): declined, has had one in the past Intimate partner violence:none Sexual History : Married Menstrual History/LMP/Abnormal Bleeding: hysterectomy Incontinence Symptoms:  none  Breast cancer:  - Last Mammogram: 10/27/21 - BRCA gene screening: none  Osteoporosis: Discussed high calcium and vitamin D supplementation, weight bearing exercises, due for dexa scan, ordered  Cervical cancer screening: hysterectomy  Skin cancer: Discussed monitoring for atypical lesions  Colorectal cancer: 04/09/21   Lung cancer:  Low Dose CT Chest recommended if Age 71-80 years, 20 pack-year currently smoking OR have quit w/in 15years. Patient does not qualify.   ECG: none  Advanced Care Planning: A voluntary discussion about advance care planning including the explanation and discussion of advance directives.  Discussed health care proxy and Living will, and the patient was able to identify a health care proxy as husband.   Lipids: Lab Results  Component Value Date   CHOL 175 11/27/2020   CHOL 197 11/03/2019   CHOL 143 01/08/2016   Lab Results  Component Value Date   HDL 65 11/27/2020   HDL 74 11/03/2019   HDL 59 01/08/2016   Lab Results  Component Value Date   LDLCALC 92 11/27/2020   LDLCALC 108 (H) 11/03/2019   LDLCALC 71 01/08/2016   Lab Results  Component Value Date   TRIG 86 11/27/2020   TRIG 68 11/03/2019   TRIG 64 01/08/2016   Lab Results  Component Value Date   CHOLHDL 2.7 11/27/2020  CHOLHDL 2.7 11/03/2019   No results found for: LDLDIRECT  Glucose: Glucose, Bld  Date Value Ref Range Status  11/27/2020 93 65 - 99 mg/dL Final    Comment:    .            Fasting reference interval .   07/25/2020 88 70 - 99 mg/dL Final  05/30/2020 86 65 - 99 mg/dL Final    Comment:    .            Fasting reference interval .     Patient Active Problem List   Diagnosis Date Noted   Pure hypercholesterolemia 11/27/2020   Cap polyposis ([place]) 07/27/2020   Abnormal colonoscopy 07/27/2020   History of anemia 07/27/2020   Constipation 07/27/2020   Unintentional weight loss 07/27/2020   Intestinal malabsorption 05/31/2020   Hypocalcemia 05/31/2020    Anemia 05/31/2020   B12 deficiency 05/31/2020   Protein-calorie malnutrition ([place]) 05/31/2020   IBD (inflammatory bowel disease) 05/31/2020   Rectal polyp 01/11/2020   Rectal bleeding 01/11/2020   Encounter for screening colonoscopy    Rectal mass    Osteoporosis 10/06/2019   Family history of ovarian cancer 07/26/2017   Vitamin D deficiency 05/07/2016   Sickle-cell trait ([place]) 05/07/2016   Osteoporosis, 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    Past Surgical History:  Procedure Laterality Date   ABDOMINAL HYSTERECTOMY  2010   total -Menopausal symptoms, takes HRT   BIOPSY  02/26/2020   Procedure: BIOPSY;  Surgeon: Irving Copas., MD;  Location: [place];  Service: Gastroenterology;;   BIOPSY  08/19/2020   Procedure: BIOPSY;  Surgeon: Irving Copas., MD;  Location: [facility] ENDOSCOPY;  Service: Gastroenterology;;   BREAST EXCISIONAL BIOPSY Left 2003   neg bx dr Jamal Collin   BREAST SURGERY Left 2004   biopsy, benign   COLONOSCOPY N/A 08/19/2020   Procedure: COLONOSCOPY;  Surgeon: Irving Copas., MD;  Location: [facility] ENDOSCOPY;  Service: Gastroenterology;  Laterality: N/A;   COLONOSCOPY W/ BIOPSIES  05/09/2020   [facility] - removed a "Cap Polyp" per patient   COLONOSCOPY WITH PROPOFOL N/A 11/29/2019   Procedure: COLONOSCOPY WITH PROPOFOL;  Surgeon: Virgel Manifold, MD;  Location: [facility] ENDOSCOPY;  Service: Endoscopy;  Laterality: N/A;   COLONOSCOPY WITH PROPOFOL N/A 02/26/2020   Procedure: COLONOSCOPY WITH PROPOFOL;  Surgeon: Rush Landmark Telford Nab., MD;  Location: [place];  Service: Gastroenterology;  Laterality: N/A;   CYST REMOVAL HAND Right 2005-06   ESOPHAGOGASTRODUODENOSCOPY (EGD) WITH PROPOFOL N/A 08/19/2020   Procedure: ESOPHAGOGASTRODUODENOSCOPY (EGD) WITH PROPOFOL;  Surgeon: Rush Landmark Telford Nab., MD;  Location: [place];  Service: Gastroenterology;  Laterality: N/A;   EUS  02/26/2020   Procedure: LOWER ENDOSCOPIC ULTRASOUND (EUS);  Surgeon: Irving Copas., MD;  Location: [place];  Service: Gastroenterology;;   Otho Darner SIGMOIDOSCOPY  05/17/20, 05/19/20   x 2 at [place] Right 10/16/2020   Procedure: DOUBLE OSTEOTOMY RIGHT;  Surgeon: [name], DPM;  Location: [place];  Service: Podiatry;  Laterality: Right;   HERNIA REPAIR  1610   umbilical   TONSILLECTOMY  1978    Family History  Problem Relation Age of Onset   Hypertension Mother    Osteoporosis Mother    Cervical cancer Mother        Hysterectomy in 56s.   Stroke Mother    Crohn's disease Mother    Irritable bowel syndrome Mother    Sickle cell trait Father    Sickle cell trait Sister  Heart disease Maternal Aunt    Breast cancer Maternal Aunt        2 mat aunts   Heart disease Maternal Uncle    Hypertension Maternal Uncle    Ovarian cancer Paternal Aunt 47   Breast cancer Paternal Aunt        pat great aunt   Heart disease Maternal Grandmother    Hypertension Maternal Grandmother    Osteoporosis Maternal Grandmother    Rheum arthritis Maternal Grandfather    Hypertension Maternal Grandfather    Prostate cancer Maternal Grandfather    Leukemia Cousin    Colon cancer Neg Hx    Esophageal cancer Neg Hx    Pancreatic cancer Neg Hx    Stomach cancer Neg Hx    Inflammatory bowel disease Neg Hx    Liver disease Neg Hx    Rectal cancer Neg Hx    Colon polyps Neg Hx     Social History   Socioeconomic History   Marital status: Married    Spouse name: julius   Number of children: 3   Years of education: Not on file   Highest education level: Not on file  Occupational History   Not on file  Tobacco Use   Smoking status: Never   Smokeless tobacco: Never  Vaping Use   Vaping Use: Never used  Substance and Sexual Activity   Alcohol use: Not Currently    Alcohol/week: 1.0 standard drink    Types: 1 Standard drinks or equivalent per week   Drug use: No   Sexual activity: Yes    Birth control/protection:  Surgical    Comment: Hysterectomy  Other Topics Concern   Not on file  Social History Narrative   Not on file   Social Determinants of Health   Financial Resource Strain: Low Risk    Difficulty of Paying Living Expenses: Not hard at all  Food Insecurity: No Food Insecurity   Worried About Charity fundraiser in the Last Year: Never true   Ran Out of Food in the Last Year: Never true  Transportation Needs: No Transportation Needs   Lack of Transportation (Medical): No   Lack of Transportation (Non-Medical): No  Physical Activity: Insufficiently Active   Days of Exercise per Week: 1 day   Minutes of Exercise per Session: 10 min  Stress: No Stress Concern Present   Feeling of Stress : Only a little  Social Connections: Engineer, building services of Communication with Friends and Family: More than three times a week   Frequency of Social Gatherings with Friends and Family: Once a week   Attends Religious Services: 1 to 4 times per year   Active Member of [employer] or Organizations: Yes   Attends Archivist Meetings: 1 to 4 times per year   Marital Status: Married  Human resources officer Violence: Not At Risk   Fear of Current or Ex-Partner: No   Emotionally Abused: No   Physically Abused: No   Sexually Abused: No     Current Outpatient Medications:    acetaminophen (TYLENOL) 325 MG tablet, Take 325-650 mg by mouth every 6 (six) hours as needed for moderate pain., Disp: , Rfl:    albuterol (VENTOLIN HFA) 108 (90 Base) MCG/ACT inhaler, Inhale 2 puffs into the lungs every 6 (six) hours as needed for wheezing or shortness of breath., Disp: , Rfl:    alendronate (FOSAMAX) 70 MG tablet, Take 1 tablet (70 mg total) by mouth every 7 (seven) days. Take  with a full glass of water on an empty stomach., Disp: 12 tablet, Rfl: 0   calcium carbonate (OSCAL) 1500 (600 Ca) MG TABS tablet, Take 600 mg of elemental calcium by mouth 2 (two) times daily., Disp: , Rfl:    Cholecalciferol  (VITAMIN D3) 125 MCG (5000 UT) TABS, Take 5,000 Units by mouth every Monday. , Disp: , Rfl:    cyanocobalamin 2000 MCG tablet, Take 2,000 mcg by mouth daily., Disp: , Rfl:    cyclobenzaprine (FLEXERIL) 10 MG tablet, Take 1 tablet (10 mg total) by mouth 3 (three) times daily as needed., Disp: 30 tablet, Rfl: 0   diphenhydrAMINE (BENADRYL) 25 MG tablet, Take 25 mg by mouth daily as needed for allergies., Disp: , Rfl:    ferrous gluconate (FERGON) 324 MG tablet, TAKE 1 TABLET BY MOUTH DAILY WITH BREAKFAST, Disp: 90 tablet, Rfl: 1   hydrocortisone 2.5 % cream, Apply topically 2 (two) times daily., Disp: , Rfl:    oxyCODONE-acetaminophen (PERCOCET) 5-325 MG tablet, Take 1 tablet by mouth every 4 (four) hours as needed for severe pain., Disp: 12 tablet, Rfl: 0   polyethylene glycol powder (GLYCOLAX/MIRALAX) 17 GM/SCOOP powder, Take 17 g by mouth 2 (two) times daily as needed., Disp: 3350 g, Rfl: 0   promethazine (PHENERGAN) 25 MG tablet, Take 0.5-1 tablets (12.5-25 mg total) by mouth every 6 (six) hours as needed for nausea or vomiting (headaches)., Disp: 20 tablet, Rfl: 0   tiZANidine (ZANAFLEX) 4 MG tablet, Take 1 tablet (4 mg total) by mouth every 8 (eight) hours as needed for muscle spasms., Disp: 90 tablet, Rfl: 0   methylPREDNISolone (MEDROL DOSEPAK) 4 MG TBPK tablet, Take 6 pills on day one then decrease by 1 pill each day (Patient not taking: Reported on 12/03/2021), Disp: 21 tablet, Rfl: 0   omeprazole (PRILOSEC) 20 MG capsule, Take 1 capsule (20 mg total) by mouth daily for 30 days, THEN 1 capsule (20 mg total) daily., Disp: 90 capsule, Rfl: 3  Current Facility-Administered Medications:    0.9 %  sodium chloride infusion, 500 mL, Intravenous, Once, Mansouraty, Telford Nab., MD  Allergies  Allergen Reactions   Erythromycin Base Other (See Comments)    Several family members have severe side effects, pt has never taken but should avoid    Penicillins Diarrhea and Rash    Did it involve swelling  of the face/tongue/throat, SOB, or low BP? Unknown Did it involve sudden or severe rash/hives, skin peeling, or any reaction on the inside of your mouth or nose? Unknown Did you need to seek medical attention at a hospital or doctor's office? Unknown When did it last happen?    Childhood   If all above answers are NO, may proceed with cephalosporin use.   Scallops [Shellfish Allergy] Anaphylaxis     ROS  Constitutional: Negative for fever or weight change.  Respiratory: Negative for cough and shortness of breath.   Cardiovascular: Negative for chest pain or palpitations.  Gastrointestinal: Negative for abdominal pain, no bowel changes.  Musculoskeletal: Negative for gait problem or joint swelling.  Skin: Negative for rash.  Neurological: Negative for dizziness or headache.  No other specific complaints in a complete review of systems (except as listed in HPI above).   Objective  Vitals:   12/03/21 1454  BP: 120/72  Pulse: 78  Resp: 16  Temp: 98 F (36.7 C)  TempSrc: Oral  SpO2: 99%  Weight: 114 lb 12.8 oz (52.1 kg)  Height: 5' 2" (1.575 m)  Body mass index is 21 kg/m.  Physical Exam   Constitutional: Patient appears well-developed and well-nourished. No distress.  HENT: Head: Normocephalic and atraumatic. Ears: B TMs ok, no erythema or effusion; Nose: Nose normal. Mouth/Throat: not done Eyes: Conjunctivae and EOM are normal. Pupils are equal, round, and reactive to light. No scleral icterus.  Neck: Normal range of motion. Neck supple. No JVD present. No thyromegaly present.  Cardiovascular: Normal rate, regular rhythm and normal heart sounds.  No murmur heard. No BLE edema. Pulmonary/Chest: Effort normal and breath sounds normal. No respiratory distress. Abdominal: Soft. Bowel sounds are normal, no distension. There is no tenderness. no masses Breast: no lumps or masses, no nipple discharge or rashes FEMALE GENITALIA: not done RECTAL: not done Musculoskeletal:  Normal range of motion, no joint effusions. No gross deformities Neurological: he is alert and oriented to person, place, and time. No cranial nerve deficit. Coordination, balance, strength, speech and gait are normal.  Skin: Skin is warm and dry. No rash noted. No erythema.  Psychiatric: Patient has a normal mood and affect. behavior is normal. Judgment and thought content normal.   No results found for this or any previous visit (from the past 2160 hour(s)).   Fall Risk: Fall Risk  12/03/2021 11/27/2020 11/06/2020 08/30/2020 08/13/2020  Falls in the past year? 0 0 0 0 0  Number falls in past yr: 0 0 0 0 0  Injury with Fall? 0 0 0 0 0  Follow up Falls evaluation completed Falls evaluation completed Falls evaluation completed - Falls evaluation completed     Functional Status Survey: Is the patient deaf or have difficulty hearing?: No Does the patient have difficulty seeing, even when wearing glasses/contacts?: No Does the patient have difficulty concentrating, remembering, or making decisions?: No Does the patient have difficulty walking or climbing stairs?: No Does the patient have difficulty dressing or bathing?: No Does the patient have difficulty doing errands alone such as visiting a doctor's office or shopping?: No   Assessment & Plan  1. Annual physical exam  - DG Bone Density; Future - Lipid panel - CBC with Differential/Platelet - COMPLETE METABOLIC PANEL WITH GFR  2. Pure hypercholesterolemia  - Lipid panel - COMPLETE METABOLIC PANEL WITH GFR  3. Anemia, unspecified type  - CBC with Differential/Platelet  4. Osteoporosis, post-menopausal  - DG Bone Density; Future  5. Need for influenza vaccination  - Flu Vaccine QUAD 6+ mos PF IM (Fluarix Quad PF)  6. Need for pneumococcal vaccine  - Pneumococcal conjugate vaccine 20-valent (Prevnar 20)   -USPSTF grade A and B recommendations reviewed with patient; age-appropriate recommendations, preventive care,  screening tests, etc discussed and encouraged; healthy living encouraged; see AVS for patient education given to patient -Discussed importance of 150 minutes of physical activity weekly, eat two servings of fish weekly, eat one serving of tree nuts ( cashews, pistachios, pecans, almonds.Marland Kitchen) every other day, eat 6 servings of fruit/vegetables daily and drink plenty of water and avoid sweet beverages.

## 2021-12-04 LAB — CBC WITH DIFFERENTIAL/PLATELET
Absolute Monocytes: 791 cells/uL (ref 200–950)
Basophils Absolute: 93 cells/uL (ref 0–200)
Basophils Relative: 1 %
Eosinophils Absolute: 177 cells/uL (ref 15–500)
Eosinophils Relative: 1.9 %
HCT: 40 % (ref 35.0–45.0)
Hemoglobin: 13.4 g/dL (ref 11.7–15.5)
Lymphs Abs: 2623 cells/uL (ref 850–3900)
MCH: 31.8 pg (ref 27.0–33.0)
MCHC: 33.5 g/dL (ref 32.0–36.0)
MCV: 94.8 fL (ref 80.0–100.0)
MPV: 9.6 fL (ref 7.5–12.5)
Monocytes Relative: 8.5 %
Neutro Abs: 5617 cells/uL (ref 1500–7800)
Neutrophils Relative %: 60.4 %
Platelets: 294 10*3/uL (ref 140–400)
RBC: 4.22 10*6/uL (ref 3.80–5.10)
RDW: 12 % (ref 11.0–15.0)
Total Lymphocyte: 28.2 %
WBC: 9.3 10*3/uL (ref 3.8–10.8)

## 2021-12-04 LAB — COMPLETE METABOLIC PANEL WITH GFR
AG Ratio: 1.5 (calc) (ref 1.0–2.5)
ALT: 18 U/L (ref 6–29)
AST: 16 U/L (ref 10–35)
Albumin: 3.8 g/dL (ref 3.6–5.1)
Alkaline phosphatase (APISO): 55 U/L (ref 31–125)
BUN: 11 mg/dL (ref 7–25)
CO2: 27 mmol/L (ref 20–32)
Calcium: 9 mg/dL (ref 8.6–10.2)
Chloride: 103 mmol/L (ref 98–110)
Creat: 0.88 mg/dL (ref 0.50–0.99)
Globulin: 2.5 g/dL (calc) (ref 1.9–3.7)
Glucose, Bld: 77 mg/dL (ref 65–99)
Potassium: 3.9 mmol/L (ref 3.5–5.3)
Sodium: 139 mmol/L (ref 135–146)
Total Bilirubin: 0.6 mg/dL (ref 0.2–1.2)
Total Protein: 6.3 g/dL (ref 6.1–8.1)
eGFR: 81 mL/min/{1.73_m2} (ref >=60)

## 2021-12-04 LAB — LIPID PANEL
Cholesterol: 179 mg/dL (ref <200)
HDL: 68 mg/dL (ref >=50)
LDL Cholesterol (Calc): 86 mg/dL (calc)
Non-HDL Cholesterol (Calc): 111 mg/dL (calc) (ref <130)
Total CHOL/HDL Ratio: 2.6 (calc) (ref <5.0)
Triglycerides: 149 mg/dL (ref <150)

## 2021-12-11 ENCOUNTER — Ambulatory Visit: Payer: BC Managed Care – PPO | Admitting: Nurse Practitioner

## 2021-12-11 ENCOUNTER — Encounter: Payer: Self-pay | Admitting: Nurse Practitioner

## 2021-12-11 VITALS — BP 132/80 | HR 94 | Temp 97.7°F | Resp 16 | Ht 62.0 in | Wt 111.1 lb

## 2021-12-11 DIAGNOSIS — M549 Dorsalgia, unspecified: Secondary | ICD-10-CM | POA: Diagnosis not present

## 2021-12-11 DIAGNOSIS — M25511 Pain in right shoulder: Secondary | ICD-10-CM

## 2021-12-11 NOTE — Progress Notes (Signed)
BP 132/80    Pulse 94    Temp 97.7 F (36.5 C) (Oral)    Resp 16    Ht 5' 2"  (1.575 m)    Wt 111 lb 1.6 oz (50.4 kg)    SpO2 99%    BMI 20.32 kg/m    Subjective:    Patient ID: Angela Gordon, female    DOB: December 23, 1970, 51 y.o.   MRN: [id number]  HPI: Angela Gordon is a 51 y.o. female  Chief Complaint  Patient presents with   paperwork   Er Follow-up Right shoulder and upper back pain: She is here to have paperwork filled out for [employer].  She was seen at the ER on 11/24/2021.  She says her pain had started 11/22/2021.  She says she had gone grocery shopping and did a lot of dicing and chopping.  She took her muscle relaxer that she had at home, and she said it did not help.  She also took tylenol which also did not help. According to the er note their physical exam findings included trapezius, levator scapula, bursa of the scapula and the lats to be tender and spasming.  She was  give Flexeril, Medrol dosepak and percocet for break through pain.  She was also to use heat and ice therapy.  She says her symptoms improved on 12/02/2021.  She reports that she is feeling much better.   Relevant past medical, surgical, family and social history reviewed and updated as indicated. Interim medical history since our last visit reviewed. Allergies and medications reviewed and updated.  Review of Systems  Constitutional: Negative for fever or weight change.  Respiratory: Negative for cough and shortness of breath.   Cardiovascular: Negative for chest pain or palpitations.  Gastrointestinal: Negative for abdominal pain, no bowel changes.  Musculoskeletal: Negative for gait problem or joint swelling.  Skin: Negative for rash.  Neurological: Negative for dizziness or headache.  No other specific complaints in a complete review of systems (except as listed in HPI above).      Objective:    BP 132/80    Pulse 94    Temp 97.7 F (36.5 C) (Oral)    Resp 16    Ht 5' 2"  (1.575 m)    Wt  111 lb 1.6 oz (50.4 kg)    SpO2 99%    BMI 20.32 kg/m   Wt Readings from Last 3 Encounters:  12/11/21 111 lb 1.6 oz (50.4 kg)  12/03/21 114 lb 12.8 oz (52.1 kg)  11/24/21 116 lb (52.6 kg)    Physical Exam  Constitutional: Patient appears well-developed and well-nourished.  No distress.  HEENT: head atraumatic, normocephalic, pupils equal and reactive to light,neck supple Cardiovascular: Normal rate, regular rhythm and normal heart sounds.  No murmur heard. No BLE edema. Pulmonary/Chest: Effort normal and breath sounds normal. No respiratory distress. Abdominal: Soft.  There is no tenderness. Psychiatric: Patient has a normal mood and affect. behavior is normal. Judgment and thought content normal.   Results for orders placed or performed in visit on 12/03/21  Lipid panel  Result Value Ref Range   Cholesterol 179 <200 mg/dL   HDL 68 > OR = 50 mg/dL   Triglycerides 149 <150 mg/dL   LDL Cholesterol (Calc) 86 mg/dL (calc)   Total CHOL/HDL Ratio 2.6 <5.0 (calc)   Non-HDL Cholesterol (Calc) 111 <130 mg/dL (calc)  CBC with Differential/Platelet  Result Value Ref Range   WBC 9.3 3.8 - 10.8 Thousand/uL  RBC 4.22 3.80 - 5.10 Million/uL   Hemoglobin 13.4 11.7 - 15.5 g/dL   HCT 40.0 35.0 - 45.0 %   MCV 94.8 80.0 - 100.0 fL   MCH 31.8 27.0 - 33.0 pg   MCHC 33.5 32.0 - 36.0 g/dL   RDW 12.0 11.0 - 15.0 %   Platelets 294 140 - 400 Thousand/uL   MPV 9.6 7.5 - 12.5 fL   Neutro Abs 5,617 1,500 - 7,800 cells/uL   Lymphs Abs 2,623 850 - 3,900 cells/uL   Absolute Monocytes 791 200 - 950 cells/uL   Eosinophils Absolute 177 15 - 500 cells/uL   Basophils Absolute 93 0 - 200 cells/uL   Neutrophils Relative % 60.4 %   Total Lymphocyte 28.2 %   Monocytes Relative 8.5 %   Eosinophils Relative 1.9 %   Basophils Relative 1.0 %  COMPLETE METABOLIC PANEL WITH GFR  Result Value Ref Range   Glucose, Bld 77 65 - 99 mg/dL   BUN 11 7 - 25 mg/dL   Creat 0.88 0.50 - 0.99 mg/dL   eGFR 81 > OR = 60  mL/min/1.75m   BUN/Creatinine Ratio NOT APPLICABLE 6 - 22 (calc)   Sodium 139 135 - 146 mmol/L   Potassium 3.9 3.5 - 5.3 mmol/L   Chloride 103 98 - 110 mmol/L   CO2 27 20 - 32 mmol/L   Calcium 9.0 8.6 - 10.2 mg/dL   Total Protein 6.3 6.1 - 8.1 g/dL   Albumin 3.8 3.6 - 5.1 g/dL   Globulin 2.5 1.9 - 3.7 g/dL (calc)   AG Ratio 1.5 1.0 - 2.5 (calc)   Total Bilirubin 0.6 0.2 - 1.2 mg/dL   Alkaline phosphatase (APISO) 55 31 - 125 U/L   AST 16 10 - 35 U/L   ALT 18 6 - 29 U/L      Assessment & Plan:   Problem List Items Addressed This Visit   None    Follow up plan: No follow-ups on file.

## 2021-12-23 ENCOUNTER — Encounter: Payer: Self-pay | Admitting: Nurse Practitioner

## 2021-12-24 ENCOUNTER — Ambulatory Visit: Payer: BC Managed Care – PPO | Admitting: Nurse Practitioner

## 2022-01-14 ENCOUNTER — Telehealth: Payer: Self-pay

## 2022-01-14 NOTE — Telephone Encounter (Signed)
Copied from [place] [id number]. Topic: General - Other >> Jan 14, 2022  2:29 PM Leward Quan A wrote: Reason for CRM: Maxwell Marion with patients insurance called in asking to speak to Marella Chimes nurse. States that it is in reference to a form completed on 12/11/21. Please call Maxwell Marion at Ph# [phone] ext# [phone]

## 2022-01-15 NOTE — Telephone Encounter (Signed)
Left message for Angela Gordon to call back

## 2022-01-15 NOTE — Telephone Encounter (Signed)
Diane called back to speak with Bonnita Nasuti, please advise.

## 2022-01-16 NOTE — Telephone Encounter (Signed)
Patient brought by paperwork to be filled out. Paperwork has been done and faxed back to [employer]

## 2022-02-25 ENCOUNTER — Other Ambulatory Visit: Payer: Self-pay | Admitting: Family Medicine

## 2022-02-26 ENCOUNTER — Other Ambulatory Visit: Payer: Self-pay

## 2022-04-05 ENCOUNTER — Encounter: Payer: Self-pay | Admitting: Gastroenterology

## 2022-04-10 ENCOUNTER — Ambulatory Visit: Payer: Self-pay

## 2022-04-10 ENCOUNTER — Telehealth: Payer: BC Managed Care – PPO | Admitting: Physician Assistant

## 2022-04-10 DIAGNOSIS — H8110 Benign paroxysmal vertigo, unspecified ear: Secondary | ICD-10-CM

## 2022-04-10 MED ORDER — PROMETHAZINE HCL 25 MG PO TABS: 12.5000 mg | ORAL_TABLET | Freq: Four times a day (QID) | ORAL | 0 refills | Status: DC | PRN

## 2022-04-10 MED ORDER — MECLIZINE HCL 25 MG PO TABS: 12.0000 mg | ORAL_TABLET | Freq: Three times a day (TID) | ORAL | 0 refills | Status: AC | PRN

## 2022-04-10 NOTE — Telephone Encounter (Signed)
Chief Complaint: Dizziness ?Symptoms: Room spinning/tilting, feels tired ?Frequency: Onset earlier this week ?Pertinent Negatives: Patient denies other symptoms ?Disposition: '[]'$ ED /'[]'$ Urgent Care (no appt availability in office) / '[]'$ Appointment(In office/virtual)/ '[x]'$  [facility] [facility]/ '[]'$ Home Care/ '[]'$ Refused Recommended Disposition /'[]'$ [place] [facility]/ '[]'$  Follow-up with PCP ?Additional Notes: N/A ? ? ? ?Reason for Disposition ? [1] MODERATE dizziness (e.g., interferes with normal activities) AND [2] has NOT been evaluated by physician for this  (Exception: dizziness caused by heat exposure, sudden standing, or poor fluid intake) ? ?Answer Assessment - Initial Assessment Questions ?1. DESCRIPTION: "Describe your dizziness." ?    Feel like I'm moving ?2. LIGHTHEADED: "Do you feel lightheaded?" (e.g., somewhat faint, woozy, weak upon standing) ?    Yes ?3. VERTIGO: "Do you feel like either you or the room is spinning or tilting?" (i.e. vertigo) ?    Yes ?4. SEVERITY: "How bad is it?"  "Do you feel like you are going to faint?" "Can you stand and walk?" ?  - MILD: Feels slightly dizzy, but walking normally. ?  - MODERATE: Feels unsteady when walking, but not falling; interferes with normal activities (e.g., school, work). ?  - SEVERE: Unable to walk without falling, or requires assistance to walk without falling; feels like passing out now.  ?    Moderate ?5. ONSET:  "When did the dizziness begin?" ?    Earlier part of this week ?6. AGGRAVATING FACTORS: "Does anything make it worse?" (e.g., standing, change in head position) ?    Standing ?7. HEART RATE: "Can you tell me your heart rate?" "How many beats in 15 seconds?"  (Note: not all patients can do this)   ?    N/A ?8. CAUSE: "What do you think is causing the dizziness?" ?    Unsure ?9. RECURRENT SYMPTOM: "Have you had dizziness before?" If Yes, ask: "When was the last time?" "What happened that time?" ?    No ?10. OTHER SYMPTOMS: "Do you have any other  symptoms?" (e.g., fever, chest pain, vomiting, diarrhea, bleeding) ?      Tired ?11. PREGNANCY: "Is there any chance you are pregnant?" "When was your last menstrual period?" ?      N/A ? ?Protocols used: Dizziness - Lightheadedness-A-AH ? ?

## 2022-04-10 NOTE — Patient Instructions (Signed)
?Angela Gordon, thank you for joining Mar Daring, [name] for today's virtual visit.  While this provider is not your primary care provider (PCP), if your PCP is located in our provider database this encounter information will be shared with them immediately following your visit. ? ?Consent: ?(Patient) Angela Gordon provided verbal consent for this virtual visit at the beginning of the encounter. ? ?Current Medications: ? ?Current Outpatient Medications:  ?  meclizine (ANTIVERT) 25 MG tablet, Take 0.5-1 tablets (12.5-25 mg total) by mouth 3 (three) times daily as needed for dizziness., Disp: 30 tablet, Rfl: 0 ?  acetaminophen (TYLENOL) 325 MG tablet, Take 325-650 mg by mouth every 6 (six) hours as needed for moderate pain., Disp: , Rfl:  ?  albuterol (VENTOLIN HFA) 108 (90 Base) MCG/ACT inhaler, Inhale 2 puffs into the lungs every 6 (six) hours as needed for wheezing or shortness of breath., Disp: , Rfl:  ?  alendronate (FOSAMAX) 70 MG tablet, Take 1 tablet (70 mg total) by mouth every 7 (seven) days. Take with a full glass of water on an empty stomach., Disp: 12 tablet, Rfl: 0 ?  calcium carbonate (OSCAL) 1500 (600 Ca) MG TABS tablet, Take 600 mg of elemental calcium by mouth 2 (two) times daily., Disp: , Rfl:  ?  Cholecalciferol (VITAMIN D3) 125 MCG (5000 UT) TABS, Take 5,000 Units by mouth every Monday. , Disp: , Rfl:  ?  cyanocobalamin 2000 MCG tablet, Take 2,000 mcg by mouth daily., Disp: , Rfl:  ?  cyclobenzaprine (FLEXERIL) 10 MG tablet, Take 1 tablet (10 mg total) by mouth 3 (three) times daily as needed., Disp: 30 tablet, Rfl: 0 ?  diphenhydrAMINE (BENADRYL) 25 MG tablet, Take 25 mg by mouth daily as needed for allergies., Disp: , Rfl:  ?  ferrous gluconate (FERGON) 324 MG tablet, TAKE 1 TABLET BY MOUTH DAILY WITH BREAKFAST, Disp: 90 tablet, Rfl: 1 ?  hydrocortisone 2.5 % cream, Apply topically 2 (two) times daily., Disp: , Rfl:  ?  omeprazole (PRILOSEC) 20 MG capsule, Take 1 capsule (20 mg  total) by mouth daily for 30 days, THEN 1 capsule (20 mg total) daily., Disp: 90 capsule, Rfl: 3 ?  oxyCODONE-acetaminophen (PERCOCET) 5-325 MG tablet, Take 1 tablet by mouth every 4 (four) hours as needed for severe pain., Disp: 12 tablet, Rfl: 0 ?  polyethylene glycol powder (GLYCOLAX/MIRALAX) 17 GM/SCOOP powder, Take 17 g by mouth 2 (two) times daily as needed., Disp: 3350 g, Rfl: 0 ?  promethazine (PHENERGAN) 25 MG tablet, Take 0.5-1 tablets (12.5-25 mg total) by mouth every 6 (six) hours as needed for nausea or vomiting (headaches)., Disp: 20 tablet, Rfl: 0 ?  tiZANidine (ZANAFLEX) 4 MG tablet, Take 1 tablet (4 mg total) by mouth every 8 (eight) hours as needed for muscle spasms., Disp: 90 tablet, Rfl: 0 ? ?Current Facility-Administered Medications:  ?  0.9 %  sodium chloride infusion, 500 mL, Intravenous, Once, Mansouraty, Telford Nab., MD  ? ?Medications ordered in this encounter:  ?Meds ordered this encounter  ?Medications  ? promethazine (PHENERGAN) 25 MG tablet  ?  Sig: Take 0.5-1 tablets (12.5-25 mg total) by mouth every 6 (six) hours as needed for nausea or vomiting (headaches).  ?  Dispense:  20 tablet  ?  Refill:  0  ?  Order Specific Question:   Supervising Provider  ?  Answer:   Noemi Chapel [[id number]]  ? meclizine (ANTIVERT) 25 MG tablet  ?  Sig: Take 0.5-1 tablets (12.5-25 mg total)  by mouth 3 (three) times daily as needed for dizziness.  ?  Dispense:  30 tablet  ?  Refill:  0  ?  Order Specific Question:   Supervising Provider  ?  Answer:   Noemi Chapel [[id number]]  ?  ? ?*If you need refills on other medications prior to your next appointment, please contact your pharmacy* ? ?Follow-Up: ?Call back or seek an in-person evaluation if the symptoms worsen or if the condition fails to improve as anticipated. ? ?Other Instructions ?Benign Positional Vertigo ?Vertigo is the feeling that you or your surroundings are moving when they are not. Benign positional vertigo is the most common form of vertigo. This is  usually a harmless condition (benign). This condition is positional. This means that symptoms are triggered by certain movements and positions. ?This condition can be dangerous if it occurs while you are doing something that could cause harm to yourself or others. This includes activities such as driving or operating machinery. ?What are the causes? ?The inner ear has fluid-filled canals that help your brain sense movement and balance. When the fluid moves, the brain receives messages about your body's position. ?With benign positional vertigo, calcium crystals in the inner ear break free and disturb the inner ear area. This causes your brain to receive confusing messages about your body's position. ?What increases the risk? ?You are more likely to develop this condition if: ?You are a woman. ?You are 52 years of age or older. ?You have recently had a head injury. ?You have an inner ear disease. ?What are the signs or symptoms? ?Symptoms of this condition usually happen when you move your head or your eyes in different directions. Symptoms may start suddenly and usually last for less than a minute. They include: ?Loss of balance and falling. ?Feeling like you are spinning or moving. ?Feeling like your surroundings are spinning or moving. ?Nausea and vomiting. ?Blurred vision. ?Dizziness. ?Involuntary eye movement (nystagmus). ?Symptoms can be mild and cause only minor problems, or they can be severe and interfere with daily life. Episodes of benign positional vertigo may return (recur) over time. Symptoms may also improve over time. ?How is this diagnosed? ?This condition may be diagnosed based on: ?Your medical history. ?A physical exam of the head, neck, and ears. ?Positional tests to check for or stimulate vertigo. You may be asked to turn your head and change positions, such as going from sitting to lying down. A health care provider will watch for symptoms of vertigo. ?You may be referred to a health care  provider who specializes in ear, nose, and throat problems (ENT or otolaryngologist) or a provider who specializes in disorders of the nervous system (neurologist). ?How is this treated? ? ?This condition may be treated in a session in which your health care provider moves your head in specific positions to help the displaced crystals in your inner ear move. Treatment for this condition may take several sessions. Surgery may be needed in severe cases, but this is rare. ?In some cases, benign positional vertigo may resolve on its own in 2-4 weeks. ?Follow these instructions at home: ?Safety ?Move slowly. Avoid sudden body or head movements or certain positions, as told by your health care provider. ?Avoid driving or operating machinery until your health care provider says it is safe. ?Avoid doing any tasks that would be dangerous to you or others if vertigo occurs. ?If you have trouble walking or keeping your balance, try using a cane for stability. If you feel  dizzy or unstable, sit down right away. ?Return to your normal activities as told by your health care provider. Ask your health care provider what activities are safe for you. ?General instructions ?Take over-the-counter and prescription medicines only as told by your health care provider. ?Drink enough fluid to keep your urine pale yellow. ?Keep all follow-up visits. This is important. ?Contact a health care provider if: ?You have a fever. ?Your condition gets worse or you develop new symptoms. ?Your family or friends notice any behavioral changes. ?You have nausea or vomiting that gets worse. ?You have numbness or a prickling and tingling sensation. ?Get help right away if you: ?Have difficulty speaking or moving. ?Are always dizzy or faint. ?Develop severe headaches. ?Have weakness in your legs or arms. ?Have changes in your hearing or vision. ?Develop a stiff neck. ?Develop sensitivity to light. ?These symptoms may represent a serious problem that is an  emergency. Do not wait to see if the symptoms will go away. Get medical help right away. Call your local emergency services (911 in the [place] Do not drive yourself to the hospital. ?Summary ?Vertigo is the fe

## 2022-04-10 NOTE — Progress Notes (Signed)
?Virtual Visit Consent  ? ?[name], you are scheduled for a virtual visit with a [facility] provider today. Just as with appointments in the office, your consent must be obtained to participate. Your consent will be active for this visit and any virtual visit you may have with one of our providers in the next 365 days. If you have a MyChart account, a copy of this consent can be sent to you electronically. ? ?As this is a virtual visit, video technology does not allow for your provider to perform a traditional examination. This may limit your provider's ability to fully assess your condition. If your provider identifies any concerns that need to be evaluated in person or the need to arrange testing (such as labs, EKG, etc.), we will make arrangements to do so. Although advances in technology are sophisticated, we cannot ensure that it will always work on either your end or our end. If the connection with a video visit is poor, the visit may have to be switched to a telephone visit. With either a video or telephone visit, we are not always able to ensure that we have a secure connection. ? ?By engaging in this virtual visit, you consent to the provision of healthcare and authorize for your insurance to be billed (if applicable) for the services provided during this visit. Depending on your insurance coverage, you may receive a charge related to this service. ? ?I need to obtain your verbal consent now. Are you willing to proceed with your visit today? Angela Gordon has provided verbal consent on 04/10/2022 for a virtual visit (video or telephone). Mar Daring, [name] ? ?Date: 04/10/2022 5:44 PM ? ?Virtual Visit via Video Note  ? ?Angela Gordon, connected with  Angela Gordon  ([id number], Apr 12, 1971) on 04/10/22 at  5:30 PM EDT by a video-enabled telemedicine application and verified that I am speaking with the correct person using two identifiers. ? ?Location: ?Patient: Virtual Visit  Location Patient: Home ?Provider: Virtual Visit Location Provider: Home Office ?  ?I discussed the limitations of evaluation and management by telemedicine and the availability of in person appointments. The patient expressed understanding and agreed to proceed.   ? ?History of Present Illness: ?Angela Gordon is a 51 y.o. who identifies as a female who was assigned female at birth, and is being seen today for dizziness. ? ?HPI: Dizziness ?This is a new problem. The current episode started in the past 7 days (Started Monday). The problem occurs constantly. The problem has been unchanged. Associated symptoms include fatigue, neck pain, vertigo and a visual change (blurry\). Pertinent negatives include no arthralgias, chills, congestion, coughing, fever, headaches, myalgias, nausea, numbness, sore throat, vomiting or weakness. The symptoms are aggravated by bending (head movement, walking). She has tried rest for the symptoms. The treatment provided no relief.   ?BP 128/88 and 131/89 ? ?Problems:  ?Patient Active Problem List  ? Diagnosis Date Noted  ? Pure hypercholesterolemia 11/27/2020  ? Cap polyposis ([place]) 07/27/2020  ? Abnormal colonoscopy 07/27/2020  ? History of anemia 07/27/2020  ? Constipation 07/27/2020  ? Unintentional weight loss 07/27/2020  ? 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 Hypocalcemia 05/31/2020  ? Anemia 05/31/2020  ? B12 deficiency 05/31/2020  ? Protein-calorie malnutrition ([place]) 05/31/2020  ? IBD (inflammatory bowel disease) 05/31/2020  ? Rectal polyp 01/11/2020  ? Rectal bleeding 01/11/2020  ? Encounter for screening colonoscopy   ? Rectal mass   ? Osteoporosis 10/06/2019  ? Family history of  ovarian cancer 07/26/2017  ? Vitamin D deficiency 05/07/2016  ? Sickle-cell trait ([place]) 05/07/2016  ? 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  ?Allergies:  ?Allergies  ?Allergen Reactions  ? Erythromycin Base Other (See Comments)  ?  Several family members have severe side effects, pt has never  taken but should avoid   ? Penicillins Diarrhea and Rash  ?  Did it involve swelling of the face/tongue/throat, SOB, or low BP? Unknown ?Did it involve sudden or severe rash/hives, skin peeling, or any reaction on the inside of your mouth or nose? Unknown ?Did you need to seek medical attention at a hospital or doctor's office? Unknown ?When did it last happen?    Childhood   ?If all above answers are ?NO?, may proceed with cephalosporin use.  ? 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 ?Medications:  ?Current Outpatient Medications:  ?  meclizine (ANTIVERT) 25 MG tablet, Take 0.5-1 tablets (12.5-25 mg total) by mouth 3 (three) times daily as needed for dizziness., Disp: 30 tablet, Rfl: 0 ?  acetaminophen (TYLENOL) 325 MG tablet, Take 325-650 mg by mouth every 6 (six) hours as needed for moderate pain., Disp: , Rfl:  ?  albuterol (VENTOLIN HFA) 108 (90 Base) MCG/ACT inhaler, Inhale 2 puffs into the lungs every 6 (six) hours as needed for wheezing or shortness of breath., Disp: , Rfl:  ?  alendronate (FOSAMAX) 70 MG tablet, Take 1 tablet (70 mg total) by mouth every 7 (seven) days. Take with a full glass of water on an empty stomach., Disp: 12 tablet, Rfl: 0 ?  calcium carbonate (OSCAL) 1500 (600 Ca) MG TABS tablet, Take 600 mg of elemental calcium by mouth 2 (two) times daily., Disp: , Rfl:  ?  Cholecalciferol (VITAMIN D3) 125 MCG (5000 UT) TABS, Take 5,000 Units by mouth every Monday. , Disp: , Rfl:  ?  cyanocobalamin 2000 MCG tablet, Take 2,000 mcg by mouth daily., Disp: , Rfl:  ?  cyclobenzaprine (FLEXERIL) 10 MG tablet, Take 1 tablet (10 mg total) by mouth 3 (three) times daily as needed., Disp: 30 tablet, Rfl: 0 ?  diphenhydrAMINE (BENADRYL) 25 MG tablet, Take 25 mg by mouth daily as needed for allergies., Disp: , Rfl:  ?  ferrous gluconate (FERGON) 324 MG tablet, TAKE 1 TABLET BY MOUTH DAILY WITH BREAKFAST, Disp: 90 tablet, Rfl: 1 ?  hydrocortisone 2.5 % cream, Apply topically 2 (two) times daily., Disp: ,  Rfl:  ?  omeprazole (PRILOSEC) 20 MG capsule, Take 1 capsule (20 mg total) by mouth daily for 30 days, THEN 1 capsule (20 mg total) daily., Disp: 90 capsule, Rfl: 3 ?  oxyCODONE-acetaminophen (PERCOCET) 5-325 MG tablet, Take 1 tablet by mouth every 4 (four) hours as needed for severe pain., Disp: 12 tablet, Rfl: 0 ?  polyethylene glycol powder (GLYCOLAX/MIRALAX) 17 GM/SCOOP powder, Take 17 g by mouth 2 (two) times daily as needed., Disp: 3350 g, Rfl: 0 ?  promethazine (PHENERGAN) 25 MG tablet, Take 0.5-1 tablets (12.5-25 mg total) by mouth every 6 (six) hours as needed for nausea or vomiting (headaches)., Disp: 20 tablet, Rfl: 0 ?  tiZANidine (ZANAFLEX) 4 MG tablet, Take 1 tablet (4 mg total) by mouth every 8 (eight) hours as needed for muscle spasms., Disp: 90 tablet, Rfl: 0 ? ?Current Facility-Administered Medications:  ?  0.9 %  sodium chloride infusion, 500 mL, Intravenous, Once, Mansouraty, Telford Nab., MD ? ?Observations/Objective: ?Patient is well-developed, well-nourished in no acute distress.  ?Resting comfortably at home.  ?Head is normocephalic, atraumatic.  ?  No labored breathing.  ?Speech is clear and coherent with logical content.  ?Patient is alert and oriented at baseline.  ? ? ?Assessment and Plan: ?1. Benign paroxysmal positional vertigo, unspecified laterality ?- promethazine (PHENERGAN) 25 MG tablet; Take 0.5-1 tablets (12.5-25 mg total) by mouth every 6 (six) hours as needed for nausea or vomiting (headaches).  Dispense: 20 tablet; Refill: 0 ?- meclizine (ANTIVERT) 25 MG tablet; Take 0.5-1 tablets (12.5-25 mg total) by mouth 3 (three) times daily as needed for dizziness.  Dispense: 30 tablet; Refill: 0 ? ?- Meclizine prescribed ?- May take Sudafed '10mg'$  OTC with meclizine ?- Push fluids ?- Promethazine refilled for migraines/headaches ?- Epley maneuver and Nestor Lewandowsky exercises provided via AVS ?- If symptoms worsen or fail to improve seek in person evaluation ? ?Follow Up Instructions: ?I  discussed the assessment and treatment plan with the patient. The patient was provided an opportunity to ask questions and all were answered. The patient agreed with the plan and demonstrated an understanding

## 2022-04-13 NOTE — Telephone Encounter (Signed)
Pt did a virtual appt with the on call dr on Friday evening ?

## 2022-04-21 ENCOUNTER — Encounter: Payer: Self-pay | Admitting: Nurse Practitioner

## 2022-04-21 ENCOUNTER — Ambulatory Visit: Payer: BC Managed Care – PPO | Admitting: Nurse Practitioner

## 2022-04-21 VITALS — BP 110/64 | HR 71 | Ht 62.0 in | Wt 115.0 lb

## 2022-04-21 DIAGNOSIS — R197 Diarrhea, unspecified: Secondary | ICD-10-CM | POA: Diagnosis not present

## 2022-04-21 DIAGNOSIS — R109 Unspecified abdominal pain: Secondary | ICD-10-CM | POA: Diagnosis not present

## 2022-04-21 MED ORDER — DICYCLOMINE HCL 10 MG PO CAPS
10.0000 mg | ORAL_CAPSULE | Freq: Two times a day (BID) | ORAL | 1 refills | Status: AC | PRN
Start: 2022-04-21 — End: ?

## 2022-04-21 NOTE — Patient Instructions (Addendum)
If you are age 50 or younger, your body mass index should be between 19-25. Your Body mass index is 21.03 kg/m. If this is out of the aformentioned range listed, please consider follow up with your Primary Care Provider.  ________________________________________________________  The [facility] providers would like to encourage you to use [facility] to communicate with providers for non-urgent requests or questions.  Due to long hold times on the telephone, sending your provider a message by [facility] may be a faster and more efficient way to get a response.  Please allow 48 business hours for a response.  Please remember that this is for non-urgent requests.  _______________________________________________________   Stay hydrated Can take Imodium 2-3 times a day if needed Trial of Bentyl 10 mg twice daily as needed for cramps, # 30 , 1 refil Call our office early Friday if not improving and will check stool studies to evaluate out infectious cause of diarrhea  Follow up as needed.  Thank you for entrusting me with your care and choosing [facility].  Tye Savoy, NP-C

## 2022-04-21 NOTE — Progress Notes (Addendum)
Assessment   Patient Profile:  Angela Gordon is a 51 y.o. female known to Dr. 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   Additional medical history as listed in [place] .  Acute crampy diarrhea? Infectious. Doesn't sound like overflow  Plan   Stay hydrated Can take Imodium 2-3 times a day if needed Trial of Bentyl 10 mg twice daily as needed for cramps.  Call our office in  few days if not improving and will check stool studies to evaluate out infectious cause of diarrhea  HPI   Chief Complaint : diarrhea  Patient was previously evaluated by Dr. Rush Landmark for constipation, rectal bleeding and a large inflammatory rectal polyp. Please refer to the 07/25/20 office note and 04/19/21 colonoscopy. She is due for follow up colonoscopy in May 2024.   Angela Gordon is here with 6 days of diarrhea with some fecal leakage. She began having abdominal cramping followed by non-bloody diarrhea last week.  She is having 5-8 diarrheal episodes a day associated with some fecal leakage. This worries her because she had the leakage when found to have the arge rectal polyp. She often takes Miralax as needed but hasn't had any in two months as bowels had been moving well. No antibiotics in last few months. She hasn't made any recent changes to her medications or diet. Weight is stable. Went to [place] over the weekend but GI symptoms preceded this by several days. Today she does feel a little better like thing may be starting to improve. She has had hot flashes but no fever.    Previous GI Evaluation   *Most recent Endoscopic exam  March 2021 colonoscopy Colonoscopy Impression: - Previously biopsied Inflammatory CAP polyp was noted and had central ulceration in the distal rectum. Does not have typical appearance of adenomatous or  villous mass but after EUS, this lesion was biopsied to rule out malignancy. - Normal mucosa in the entire examined colon otherwise. - Non-bleeding non-thrombosed external and internal hemorrhoids.   March 2021  EUS No malignant appearing perirectal lymph nodes region and in the left iliac region. - The internal anal sphincter was visualized endosonographically and appeared normal. - A mural lesion was visualized endosonographically in the rectum. The origin of the lesion appeared to be within the luminal interface/superficial mucosa (Layer 1) and deep mucosa (Layer 2) but there was suggestion of lesion extending into the Submucosa (Layer 3 ) at the region of the ulceration in the middle of the lesion. - Endosonographic images of the perirectal space were unremarkable. - Palpable rectal lesion and non-thrombosed external hemorrhoids found on digital rectal exam.   Colonoscopy May 2022 Hemorrhoids found on digital rectal exam. - Polypoid lesion in the distal rectum. Biopsied. - Normal mucosa in the entire examined colon otherwise. - Non-bleeding non-thrombosed external and internal hemorrhoids.   Labs:     Latest Ref Rng & Units 12/03/2021    3:18 PM 06/05/2021   10:33 AM 11/27/2020   12:19 PM  CBC  WBC 3.8 - 10.8 Thousand/uL 9.3   7.0   6.8    Hemoglobin 11.7 - 15.5 g/dL 13.4   14.3   14.3    Hematocrit 35.0 - 45.0 % 40.0   41.9   41.7    Platelets 140 - 400 Thousand/uL 294   240.0   245  Latest Ref Rng & Units 12/03/2021    3:18 PM 11/27/2020   12:19 PM 07/25/2020   11:26 AM  Hepatic Function  Total Protein 6.1 - 8.1 g/dL 6.3   7.0   7.9    Albumin 3.5 - 5.2 g/dL   4.6    AST 10 - 35 U/L _0 ALT 6 - 29 U/L _1 Alk Phosphatase 39 - 117 U/L   50    Total Bilirubin 0.2 - 1.2 mg/dL 0.6   1.2   0.8       Past Medical History:  Diagnosis Date   Anemia    Asthma    Uses rescue inhaler occasionally.   Complication of anesthesia    slow to  wake up   Family history of adverse reaction to anesthesia    Sister- Cardiac arrest during emergency c-section   Headache    occasional - otc med prn   Heart murmur    was told she had MVP when she was around 24 years, she states she never followed up with that and has never had any issues from it   History of blood transfusion 1992   Blood Loss from Toxemia of Pregnancy   Hypoglycemia    Osteoporosis    Strong FHx of Osteoporosis.   PONV (postoperative nausea and vomiting)    Scoliosis    Seen Dr. Jefm Bryant   Sickle-cell trait [facility]    Stroke [facility]) 1992   Toxemia of Pregnancy     Past Surgical History:  Procedure Laterality Date   ABDOMINAL HYSTERECTOMY  2010   total -Menopausal symptoms, takes HRT   BIOPSY  02/26/2020   Procedure: BIOPSY;  Surgeon: Irving Copas., MD;  Location: [place];  Service: Gastroenterology;;   BIOPSY  08/19/2020   Procedure: BIOPSY;  Surgeon: Irving Copas., MD;  Location: [facility] ENDOSCOPY;  Service: Gastroenterology;;   BREAST EXCISIONAL BIOPSY Left 2003   neg bx dr Jamal Collin   BREAST SURGERY Left 2004   biopsy, benign   COLONOSCOPY N/A 08/19/2020   Procedure: COLONOSCOPY;  Surgeon: Irving Copas., MD;  Location: [place];  Service: Gastroenterology;  Laterality: N/A;   COLONOSCOPY W/ BIOPSIES  05/09/2020   [facility] - removed a "Cap Polyp" per patient   COLONOSCOPY WITH PROPOFOL N/A 11/29/2019   Procedure: COLONOSCOPY WITH PROPOFOL;  Surgeon: Virgel Manifold, MD;  Location: [facility] ENDOSCOPY;  Service: Endoscopy;  Laterality: N/A;   COLONOSCOPY WITH PROPOFOL N/A 02/26/2020   Procedure: COLONOSCOPY WITH PROPOFOL;  Surgeon: Rush Landmark Telford Nab., MD;  Location: [place];  Service: Gastroenterology;  Laterality: N/A;   CYST REMOVAL HAND Right 2005-06   ESOPHAGOGASTRODUODENOSCOPY (EGD) WITH PROPOFOL N/A 08/19/2020   Procedure: ESOPHAGOGASTRODUODENOSCOPY (EGD) WITH PROPOFOL;  Surgeon: Rush Landmark Telford Nab., MD;  Location:  [place];  Service: Gastroenterology;  Laterality: N/A;   EUS  02/26/2020   Procedure: LOWER ENDOSCOPIC ULTRASOUND (EUS);  Surgeon: Irving Copas., MD;  Location: [place];  Service: Gastroenterology;;   Otho Darner SIGMOIDOSCOPY  05/17/20, 05/19/20   x 2 at [place] Right 10/16/2020   Procedure: DOUBLE OSTEOTOMY RIGHT;  Surgeon: [name], DPM;  Location: [place];  Service: Podiatry;  Laterality: Right;   HERNIA REPAIR  1275   umbilical   TONSILLECTOMY  1978    Current Medications, Allergies, Family History and Social History were reviewed in [employer]  medical record.     Current Outpatient Medications  Medication Sig Dispense Refill   acetaminophen (TYLENOL) 325 MG tablet Take 325-650 mg by mouth every 6 (six) hours as needed for moderate pain.     albuterol (VENTOLIN HFA) 108 (90 Base) MCG/ACT inhaler Inhale 2 puffs into the lungs every 6 (six) hours as needed for wheezing or shortness of breath.     alendronate (FOSAMAX) 70 MG tablet Take 1 tablet (70 mg total) by mouth every 7 (seven) days. Take with a full glass of water on an empty stomach. 12 tablet 0   calcium carbonate (OSCAL) 1500 (600 Ca) MG TABS tablet Take 600 mg of elemental calcium by mouth 2 (two) times daily.     Cholecalciferol (VITAMIN D3) 125 MCG (5000 UT) TABS Take 5,000 Units by mouth every Monday.      cyanocobalamin 2000 MCG tablet Take 2,000 mcg by mouth daily.     cyclobenzaprine (FLEXERIL) 10 MG tablet Take 1 tablet (10 mg total) by mouth 3 (three) times daily as needed. 30 tablet 0   diphenhydrAMINE (BENADRYL) 25 MG tablet Take 25 mg by mouth daily as needed for allergies.     ferrous gluconate (FERGON) 324 MG tablet TAKE 1 TABLET BY MOUTH DAILY WITH BREAKFAST 90 tablet 1   hydrocortisone 2.5 % cream Apply topically 2 (two) times daily.     meclizine (ANTIVERT) 25 MG tablet Take 0.5-1 tablets (12.5-25 mg total) by mouth 3 (three) times  daily as needed for dizziness. 30 tablet 0   omeprazole (PRILOSEC) 20 MG capsule Take 1 capsule (20 mg total) by mouth daily for 30 days, THEN 1 capsule (20 mg total) daily. 90 capsule 3   oxyCODONE-acetaminophen (PERCOCET) 5-325 MG tablet Take 1 tablet by mouth every 4 (four) hours as needed for severe pain. 12 tablet 0   polyethylene glycol powder (GLYCOLAX/MIRALAX) 17 GM/SCOOP powder Take 17 g by mouth 2 (two) times daily as needed. 3350 g 0   promethazine (PHENERGAN) 25 MG tablet Take 0.5-1 tablets (12.5-25 mg total) by mouth every 6 (six) hours as needed for nausea or vomiting (headaches). 20 tablet 0   tiZANidine (ZANAFLEX) 4 MG tablet Take 1 tablet (4 mg total) by mouth every 8 (eight) hours as needed for muscle spasms. 90 tablet 0   Current Facility-Administered Medications  Medication Dose Route Frequency Provider Last Rate Last Admin   0.9 %  sodium chloride infusion  500 mL Intravenous Once Mansouraty, Telford Nab., MD        Review of Systems: No chest pain. No shortness of breath. No urinary complaints.    Physical Exam  Wt Readings from Last 3 Encounters:  12/11/21 111 lb 1.6 oz (50.4 kg)  12/03/21 114 lb 12.8 oz (52.1 kg)  11/24/21 116 lb (52.6 kg)    BP 110/64   Pulse 71   Ht _0  (1.575 m)   Wt 115 lb (52.2 kg)   BMI 21.03 kg/m  Constitutional:  Generally well appearing female in no acute distress. Psychiatric: Pleasant. Normal mood and affect. Behavior is normal. EENT: Pupils normal.  Conjunctivae are normal. No scleral icterus. Neck supple.  Cardiovascular: Normal rate, regular rhythm. No edema Pulmonary/chest: Effort normal and breath sounds normal. No wheezing, rales or rhonchi. Abdominal: Soft, nondistended, nontender. Bowel sounds active throughout. There are no masses palpable. No hepatomegaly. Neurological: Alert and oriented to person place and time. Skin: Skin is warm and dry. No rashes noted.  Tye Savoy, NP  04/21/2022, 8:50  AM

## 2022-04-21 NOTE — Progress Notes (Signed)
Attending Physician's Attestation   I have reviewed the chart.   I agree with the Advanced Practitioner's note, impression, and recommendations with any updates as below.    Clifton Safley Mansouraty, MD [facility] Office # [phone]  

## 2022-04-23 MED ORDER — HYDROCORTISONE ACETATE 25 MG RE SUPP: 25.0000 mg | Freq: Two times a day (BID) | RECTAL | 1 refills | Status: DC

## 2022-04-23 NOTE — Telephone Encounter (Signed)
Okay for prescription of Anusol/Anucort. Thanks. GM

## 2022-04-23 NOTE — Telephone Encounter (Signed)
Dr Rush Landmark we refill Anucort?

## 2022-04-28 ENCOUNTER — Ambulatory Visit: Payer: Self-pay | Admitting: *Deleted

## 2022-04-28 ENCOUNTER — Other Ambulatory Visit: Payer: Self-pay | Admitting: Nurse Practitioner

## 2022-04-28 ENCOUNTER — Encounter: Payer: Self-pay | Admitting: Family Medicine

## 2022-04-28 ENCOUNTER — Ambulatory Visit: Payer: BC Managed Care – PPO | Admitting: Family Medicine

## 2022-04-28 VITALS — BP 118/76 | HR 91 | Resp 16 | Ht 62.0 in | Wt 114.0 lb

## 2022-04-28 DIAGNOSIS — J329 Chronic sinusitis, unspecified: Secondary | ICD-10-CM | POA: Diagnosis not present

## 2022-04-28 DIAGNOSIS — J209 Acute bronchitis, unspecified: Secondary | ICD-10-CM | POA: Diagnosis not present

## 2022-04-28 DIAGNOSIS — J310 Chronic rhinitis: Secondary | ICD-10-CM

## 2022-04-28 MED ORDER — PREDNISONE 20 MG PO TABS: 40.0000 mg | ORAL_TABLET | Freq: Every day | ORAL | 0 refills | Status: AC

## 2022-04-28 MED ORDER — ALBUTEROL SULFATE HFA 108 (90 BASE) MCG/ACT IN AERS: 2.0000 | INHALATION_SPRAY | Freq: Four times a day (QID) | RESPIRATORY_TRACT | 2 refills | Status: DC | PRN

## 2022-04-28 MED ORDER — DOXYCYCLINE HYCLATE 100 MG PO TABS: 100.0000 mg | ORAL_TABLET | Freq: Two times a day (BID) | ORAL | 0 refills | Status: AC

## 2022-04-28 MED ORDER — LEVOCETIRIZINE DIHYDROCHLORIDE 5 MG PO TABS: 5.0000 mg | ORAL_TABLET | Freq: Every evening | ORAL | 1 refills | Status: DC

## 2022-04-28 NOTE — Telephone Encounter (Signed)
Angela Gordon the pt last saw Nevin Bloodgood.

## 2022-04-28 NOTE — Telephone Encounter (Signed)
Per agent: "The patient has experienced sinus discomfort for more than a week   The patient has a cough, irritated throat, yellow colored mucus   The patient has no fever, body aches and has tested negative for COVID multiple times   Please contact further "   Chief Complaint: Cough Symptoms: Productive cough, yellowish, sore throat,sinus discomfort,hoarse voice,loose stools Frequency: 5 days. Pertinent Negatives: Patient denies fever, SOB, wheezing Disposition: '[]'$ ED /'[]'$ Urgent Care (no appt availability in office) / '[x]'$ Appointment(In office/virtual)/ '[]'$  [place] [facility]/ '[]'$ Home Care/ '[]'$ Refused Recommended Disposition /'[]'$ [place] [facility]/ '[]'$  Follow-up with PCP Additional Notes: Care advise provided, verbalizes understanding.  Reason for Disposition  SEVERE coughing spells (e.g., whooping sound after coughing, vomiting after coughing)  Answer Assessment - Initial Assessment Questions 1. ONSET: "When did the cough begin?"      5 days ago 2. SEVERITY: "How bad is the cough today?"      Awake at night 3. SPUTUM: "Describe the color of your sputum" (none, dry cough; clear, white, yellow, green)     Yellowish 4. HEMOPTYSIS: "Are you coughing up any blood?" If so ask: "How much?" (flecks, streaks, tablespoons, etc.)     no 5. DIFFICULTY BREATHING: "Are you having difficulty breathing?" If Yes, ask: "How bad is it?" (e.g., mild, moderate, severe)    - MILD: No SOB at rest, mild SOB with walking, speaks normally in sentences, can lie down, no retractions, pulse < 100.    - MODERATE: SOB at rest, SOB with minimal exertion and prefers to sit, cannot lie down flat, speaks in phrases, mild retractions, audible wheezing, pulse 100-120.    - SEVERE: Very SOB at rest, speaks in single words, struggling to breathe, sitting hunched forward, retractions, pulse > 120      None 6. FEVER: "Do you have a fever?" If Yes, ask: "What is your temperature, how was it measured, and when did it  start?" no 10. OTHER SYMPTOMS: "Do you have any other symptoms?" (e.g., runny nose, wheezing, chest pain)       Sinus discomfort, nose runny, loose stools (GI follow up) voice hoarse.  Protocols used: Cough - Acute Productive-A-AH

## 2022-04-28 NOTE — Patient Instructions (Signed)
Try changing up the antihistamines  Like add claritin- D in am and try different antihistamine at night like xyzal and maybe try benadryl to help with sleep and drying you up more Start the steroids and use the inhaler as soon as you can today   Hold the antibiotic and only start if you get suddenly worse - severe sinus/facial pain and/or fever

## 2022-04-28 NOTE — Progress Notes (Signed)
Patient ID: Angela Gordon, female    DOB: 19-Apr-1971, 51 y.o.   MRN: [id number]  PCP: Delsa Grana, [name]  Chief Complaint  Patient presents with   Cough    X6 days   Sinusitis    Since the 22nd/23rd    Subjective:   Angela Gordon is a 51 y.o. female, presents to clinic with CC of the following:  Pt called in earlier today with the reported sx and is here for eval: The patient has experienced sinus discomfort for more than a week   The patient has a cough, irritated throat, yellow colored mucus   The patient has no fever, body aches and has tested negative for COVID multiple times   Chief Complaint: Cough Symptoms: Productive cough, yellowish, sore throat,sinus discomfort,hoarse voice,loose stools Frequency: 5 days. Pertinent Negatives: Patient denies fever, SOB, wheezing Disposition: _0 ED /_1 Urgent Care (no appt availability in office) / _2 Appointment(In office/virtual)/ _3  [facility]/ _4 Home Care/ _5 Refused Recommended Disposition /_6 [facility]/ _7  Follow-up with PCP Additional Notes: Care advise provided, verbalizes understanding.   Reason for Disposition  SEVERE coughing spells (e.g., whooping sound after coughing, vomiting after coughing)  Answer Assessment - Initial Assessment Questions 1. ONSET: "When did the cough begin?"      5 days ago 2. SEVERITY: "How bad is the cough today?"      Awake at night 3. SPUTUM: "Describe the color of your sputum" (none, dry cough; clear, white, yellow, green)     Yellowish 4. HEMOPTYSIS: "Are you coughing up any blood?" If so ask: "How much?" (flecks, streaks, tablespoons, etc.)     no 5. DIFFICULTY BREATHING: "Are you having difficulty breathing?" If Yes, ask: "How bad is it?" (e.g., mild, moderate, severe)    - MILD: No SOB at rest, mild SOB with walking, speaks normally in sentences, can lie down, no retractions, pulse < 100.    - MODERATE: SOB at rest, SOB with minimal exertion and prefers  to sit, cannot lie down flat, speaks in phrases, mild retractions, audible wheezing, pulse 100-120.    - SEVERE: Very SOB at rest, speaks in single words, struggling to breathe, sitting hunched forward, retractions, pulse > 120      None 6. FEVER: "Do you have a fever?" If Yes, ask: "What is your temperature, how was it measured, and when did it start?" no 10. OTHER SYMPTOMS: "Do you have any other symptoms?" (e.g., runny nose, wheezing, chest pain)       Sinus discomfort, nose runny, loose stools (GI follow up) voice hoarse.   She also previously did visit for vertigo and is still taking cough meds and meclizine  Sinusitis This is a new problem. The current episode started 1 to 4 weeks ago. The problem is unchanged. There has been no fever. The pain is mild. Associated symptoms include congestion, coughing, a hoarse voice, sinus pressure, sneezing and a sore throat. Pertinent negatives include no chills, diaphoresis, ear pain, headaches, neck pain, shortness of breath or swollen glands. Past treatments include oral decongestants and saline sprays. The treatment provided no relief.  URI  This is a new problem. The current episode started 1 to 4 weeks ago. The problem has been unchanged. There has been no fever. Associated symptoms include congestion, coughing, diarrhea, rhinorrhea, sinus pain, sneezing and a sore throat. Pertinent negatives include no abdominal pain, chest pain, dysuria, ear pain, headaches, joint pain, joint swelling, nausea, neck pain, plugged ear sensation, rash, swollen glands, vomiting  or wheezing. She has tried antihistamine and decongestant for the symptoms. The treatment provided no relief.   Cough is mostly dry, she has coughing fits No CP SOB  She is taking zyrtec and flonase, added sudafed, congestion and pressure constant but she isn't having severe HA or facial pain and no fever, mostly she feels ok and doesn't feel sick, no fatigue, body aches, malaise Her stool got  a little looser and she is going to f/up with GI  Patient Active Problem List   Diagnosis Date Noted   Pure hypercholesterolemia 11/27/2020   Cap polyposis ([place]) 07/27/2020   Abnormal colonoscopy 07/27/2020   History of anemia 07/27/2020   Constipation 07/27/2020   Unintentional weight loss 07/27/2020   Intestinal malabsorption 05/31/2020   Hypocalcemia 05/31/2020   Anemia 05/31/2020   B12 deficiency 05/31/2020   Protein-calorie malnutrition ([place]) 05/31/2020   IBD (inflammatory bowel disease) 05/31/2020   Rectal polyp 01/11/2020   Rectal bleeding 01/11/2020   Encounter for screening colonoscopy    Rectal mass    Osteoporosis 10/06/2019   Family history of ovarian cancer 07/26/2017   Vitamin D deficiency 05/07/2016   Sickle-cell trait ([place]) 05/07/2016   Osteoporosis, 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 , Disp: , Rfl:    cyanocobalamin 2000 MCG tablet, Take 2,000 mcg by mouth daily., Disp: , Rfl:    cyclobenzaprine (FLEXERIL) 10 MG tablet, Take 1 tablet (10 mg total) by mouth 3 (three) times daily as needed., Disp: 30 tablet, Rfl: 0   dicyclomine (BENTYL) 10 MG capsule, Take 1 capsule (10 mg total) by mouth 2 (two) times daily as needed (abdominal cramps)., Disp: 30 capsule, Rfl: 1   diphenhydrAMINE (BENADRYL) 25 MG tablet, Take 25 mg by mouth daily as needed for allergies., Disp: , Rfl:    hydrocortisone (ANUSOL-HC) 25 MG  suppository, Place 1 suppository (25 mg total) rectally every 12 (twelve) hours., Disp: 12 suppository, Rfl: 1   hydrocortisone 2.5 % cream, Apply topically 2 (two) times daily., Disp: , Rfl:    meclizine (ANTIVERT) 25 MG tablet, Take 0.5-1 tablets (12.5-25 mg total) by mouth 3 (three) times daily as needed for dizziness., Disp: 30 tablet, Rfl: 0   oxyCODONE-acetaminophen (PERCOCET) 5-325 MG tablet, Take 1 tablet by mouth every 4 (four) hours as needed for severe pain., Disp: 12 tablet, Rfl: 0   polyethylene glycol powder (GLYCOLAX/MIRALAX) 17 GM/SCOOP powder, Take 17 g by mouth 2 (two) times daily as needed., Disp: 3350 g, Rfl: 0   promethazine (PHENERGAN) 25 MG tablet, Take 0.5-1 tablets (12.5-25 mg total) by mouth every 6 (six) hours as needed for nausea or vomiting (headaches)., Disp: 20 tablet, Rfl: 0   tiZANidine (ZANAFLEX) 4 MG tablet, Take 1 tablet (4 mg total) by mouth every 8 (eight) hours as needed for muscle spasms., Disp: 90 tablet, Rfl: 0   omeprazole (PRILOSEC) 20 MG capsule, Take 1  capsule (20 mg total) by mouth daily for 30 days, THEN 1 capsule (20 mg total) daily. (Patient taking differently: Take 1 capsule (20 mg total) by mouth daily for 30 days, THEN 1 capsule (20 mg total) daily. Uses as needed), Disp: 90 capsule, Rfl: 3  Current Facility-Administered Medications:    0.9 %  sodium chloride infusion, 500 mL, Intravenous, Once, Mansouraty, Telford Nab., MD   Allergies  Allergen Reactions   Erythromycin Base Other (See Comments)    Several family members have severe side effects, pt has never taken but should avoid    Penicillins Diarrhea and Rash    Did it involve swelling of the face/tongue/throat, SOB, or low BP? Unknown Did it involve sudden or severe rash/hives, skin peeling, or any reaction on the inside of your mouth or nose? Unknown Did you need to seek medical attention at a hospital or doctor's office? Unknown When did it last happen?    Childhood   If all above  answers are "NO", may proceed with cephalosporin use.   Scallops [Shellfish Allergy] Anaphylaxis     Social History   Tobacco Use   Smoking status: Never   Smokeless tobacco: Never  Vaping Use   Vaping Use: Never used  Substance Use Topics   Alcohol use: Yes    Alcohol/week: 1.0 standard drink    Types: 1 Standard drinks or equivalent per week    Comment: rarely   Drug use: No      Chart Review Today: I personally reviewed active problem list, medication list, allergies, family history, social history, health maintenance, notes from last encounter, lab results, imaging with the patient/caregiver today.   Review of Systems  Constitutional: Negative.  Negative for chills and diaphoresis.  HENT:  Positive for congestion, hoarse voice, rhinorrhea, sinus pressure, sinus pain, sneezing and sore throat. Negative for ear pain.   Eyes: Negative.   Respiratory:  Positive for cough. Negative for shortness of breath and wheezing.   Cardiovascular: Negative.  Negative for chest pain.  Gastrointestinal:  Positive for diarrhea. Negative for abdominal pain, nausea and vomiting.  Endocrine: Negative.   Genitourinary: Negative.  Negative for dysuria.  Musculoskeletal: Negative.  Negative for joint pain and neck pain.  Skin: Negative.  Negative for rash.  Allergic/Immunologic: Negative.   Neurological: Negative.  Negative for headaches.  Hematological: Negative.   Psychiatric/Behavioral: Negative.    All other systems reviewed and are negative.     Objective:   Vitals:   04/28/22 1021  BP: 118/76  Pulse: 91  Resp: 16  SpO2: 98%  Weight: 114 lb (51.7 kg)  Height: _0  (1.575 m)    Body mass index is 20.85 kg/m.  Physical Exam Vitals and nursing note reviewed.  Constitutional:      General: She is not in acute distress.    Appearance: Normal appearance. She is well-developed. She is not ill-appearing, toxic-appearing or diaphoretic.  HENT:     Head: Normocephalic and  atraumatic.     Right Ear: Hearing, tympanic membrane, ear canal and external ear normal. There is no impacted cerumen.     Left Ear: Hearing, tympanic membrane, ear canal and external ear normal. There is no impacted cerumen.     Nose: Mucosal edema, congestion and rhinorrhea present. Rhinorrhea is clear.     Right Turbinates: Enlarged, swollen and pale.     Left Turbinates: Enlarged, swollen and pale.     Right Sinus: No maxillary sinus tenderness or frontal sinus tenderness.  Left Sinus: No maxillary sinus tenderness or frontal sinus tenderness.     Mouth/Throat:     Mouth: Mucous membranes are moist. Mucous membranes are not pale.     Pharynx: Oropharynx is clear. Uvula midline. No oropharyngeal exudate, posterior oropharyngeal erythema or uvula swelling.     Tonsils: No tonsillar abscesses.  Eyes:     General: No scleral icterus.       Right eye: No discharge.        Left eye: No discharge.     Conjunctiva/sclera: Conjunctivae normal.     Pupils: Pupils are equal, round, and reactive to light.  Neck:     Trachea: Trachea and phonation normal. No tracheal deviation.  Cardiovascular:     Rate and Rhythm: Normal rate and regular rhythm.     Pulses: Normal pulses.     Heart sounds: Normal heart sounds. No murmur heard.   No friction rub. No gallop.  Pulmonary:     Effort: Pulmonary effort is normal. No respiratory distress.     Breath sounds: Normal breath sounds. No stridor. No wheezing, rhonchi or rales.  Abdominal:     General: Bowel sounds are normal. There is no distension.     Palpations: Abdomen is soft.  Musculoskeletal:        General: Normal range of motion.     Cervical back: Normal range of motion and neck supple.  Lymphadenopathy:     Cervical: No cervical adenopathy.  Skin:    General: Skin is warm and dry.     Coloration: Skin is not pale.     Findings: No rash.  Neurological:     Mental Status: She is alert.     Motor: No abnormal muscle tone.      Coordination: Coordination normal.  Psychiatric:        Behavior: Behavior normal.     Results for orders placed or performed in visit on 12/03/21  Lipid panel  Result Value Ref Range   Cholesterol 179 <200 mg/dL   HDL 68 > OR = 50 mg/dL   Triglycerides 149 <150 mg/dL   LDL Cholesterol (Calc) 86 mg/dL (calc)   Total CHOL/HDL Ratio 2.6 <5.0 (calc)   Non-HDL Cholesterol (Calc) 111 <130 mg/dL (calc)  CBC with Differential/Platelet  Result Value Ref Range   WBC 9.3 3.8 - 10.8 Thousand/uL   RBC 4.22 3.80 - 5.10 Million/uL   Hemoglobin 13.4 11.7 - 15.5 g/dL   HCT 40.0 35.0 - 45.0 %   MCV 94.8 80.0 - 100.0 fL   MCH 31.8 27.0 - 33.0 pg   MCHC 33.5 32.0 - 36.0 g/dL   RDW 12.0 11.0 - 15.0 %   Platelets 294 140 - 400 Thousand/uL   MPV 9.6 7.5 - 12.5 fL   Neutro Abs 5,617 1,500 - 7,800 cells/uL   Lymphs Abs 2,623 850 - 3,900 cells/uL   Absolute Monocytes 791 200 - 950 cells/uL   Eosinophils Absolute 177 15 - 500 cells/uL   Basophils Absolute 93 0 - 200 cells/uL   Neutrophils Relative % 60.4 %   Total Lymphocyte 28.2 %   Monocytes Relative 8.5 %   Eosinophils Relative 1.9 %   Basophils Relative 1.0 %  COMPLETE METABOLIC PANEL WITH GFR  Result Value Ref Range   Glucose, Bld 77 65 - 99 mg/dL   BUN 11 7 - 25 mg/dL   Creat 0.88 0.50 - 0.99 mg/dL   eGFR 81 > OR = 60 mL/min/1.66m   BUN/Creatinine Ratio  NOT APPLICABLE 6 - 22 (calc)   Sodium 139 135 - 146 mmol/L   Potassium 3.9 3.5 - 5.3 mmol/L   Chloride 103 98 - 110 mmol/L   CO2 27 20 - 32 mmol/L   Calcium 9.0 8.6 - 10.2 mg/dL   Total Protein 6.3 6.1 - 8.1 g/dL   Albumin 3.8 3.6 - 5.1 g/dL   Globulin 2.5 1.9 - 3.7 g/dL (calc)   AG Ratio 1.5 1.0 - 2.5 (calc)   Total Bilirubin 0.6 0.2 - 1.2 mg/dL   Alkaline phosphatase (APISO) 55 31 - 125 U/L   AST 16 10 - 35 U/L   ALT 18 6 - 29 U/L       Assessment & Plan:   Pt presents with over 2 weeks of URI sx -overall she reports feeling pretty well and able to work and do her normal  day-to-day she has not had any fever sweats chills wheeze chest pain shortness of breath myalgias fatigue She does continue to have a nonproductive cough that is coming in big fits -sometimes worse at night she endorses continued mild sinus congestion and pressure    ICD-10-CM   1. Rhinosinusitis  J31.0 levocetirizine (XYZAL) 5 MG tablet   J32.9     2. Acute bronchitis, unspecified organism  J20.9 predniSONE (DELTASONE) 20 MG tablet    albuterol (VENTOLIN HFA) 108 (90 Base) MCG/ACT inhaler     My plan to the patient is as follows:  Try changing up the antihistamines  Like add claritin- D in am and try different antihistamine at night like xyzal and maybe try benadryl to help with sleep and drying you up more Start the steroids and use the inhaler as soon as you can today   Hold the antibiotic and only start if you get suddenly worse - severe sinus/facial pain and/or fever    Because she has no severe facial or sinus tenderness and she has not had a fever or acute worsening I would like to hold off on antibiotics for as long as she can I am concerned that they would irritate her GI system.  She overall is well appearing, exam unremarkable today, can continue supportive and sx measures for now for sinuses, we'll try steroid burst and inhaler for cough - sounds like acute bronchitis  Follow-up as needed     Delsa Grana, [name] 04/28/22 10:30 AM

## 2022-05-20 ENCOUNTER — Other Ambulatory Visit: Payer: Self-pay | Admitting: Family Medicine

## 2022-05-20 DIAGNOSIS — J310 Chronic rhinitis: Secondary | ICD-10-CM

## 2022-06-01 ENCOUNTER — Encounter: Payer: Self-pay | Admitting: Nurse Practitioner

## 2022-06-01 ENCOUNTER — Other Ambulatory Visit: Payer: Self-pay | Admitting: Nurse Practitioner

## 2022-06-01 ENCOUNTER — Ambulatory Visit: Payer: BC Managed Care – PPO | Admitting: Nurse Practitioner

## 2022-06-01 ENCOUNTER — Other Ambulatory Visit: Payer: Self-pay

## 2022-06-01 VITALS — BP 118/72 | HR 85 | Temp 98.5°F | Resp 18 | Ht 62.0 in | Wt 116.4 lb

## 2022-06-01 DIAGNOSIS — B0223 Postherpetic polyneuropathy: Secondary | ICD-10-CM

## 2022-06-01 DIAGNOSIS — N644 Mastodynia: Secondary | ICD-10-CM

## 2022-06-01 DIAGNOSIS — N6452 Nipple discharge: Secondary | ICD-10-CM

## 2022-06-01 MED ORDER — VALACYCLOVIR HCL 1 G PO TABS: 1000.0000 mg | ORAL_TABLET | Freq: Three times a day (TID) | ORAL | 0 refills | Status: AC

## 2022-06-01 MED ORDER — PRAMOXINE-CALAMINE 1-3 % EX LOTN: 1.0000 | TOPICAL_LOTION | Freq: Four times a day (QID) | CUTANEOUS | 1 refills | Status: DC

## 2022-06-01 MED ORDER — GABAPENTIN 300 MG PO CAPS: 300.0000 mg | ORAL_CAPSULE | Freq: Three times a day (TID) | ORAL | 0 refills | Status: DC

## 2022-06-01 NOTE — Progress Notes (Signed)
BP 118/72   Pulse 85   Temp 98.5 F (36.9 C) (Oral)   Resp 18   Ht 5' 2"  (1.575 m)   Wt 116 lb 6.4 oz (52.8 kg)   SpO2 98%   BMI 21.29 kg/m    Subjective:    Patient ID: Angela Gordon, female    DOB: August 20, 1971, 51 y.o.   MRN: [id number]  HPI: Angela Gordon is a 51 y.o. female  Chief Complaint  Patient presents with   Rash    Spot on abdomen and back   Shingles:  She noticed the rash a day ago.  She says she has a rash on her left back wrapping around to the front.  She says she is having tingling and nerve pain.  Her rash is in one dermatome.  Will send in valtrex, gabapentin and pramoxine-calamine lotion.  Patient is in agreement with plan.   Right nipple discharge: She says she has breast tenderness and nipple discharge.  She says she noticed it on Friday.  Patient says she does have a family history of breast cancer. Patient reports this has happened before but it was a while ago. Patient states her last mammogram was in November.  It showed there was a possible mass that warranted further evaluation.  The diagnostic mammogram of the right breast showed that the mass was  then resolved.   Relevant past medical, surgical, family and social history reviewed and updated as indicated. Interim medical history since our last visit reviewed. Allergies and medications reviewed and updated.  Review of Systems  Constitutional: Negative for fever or weight change.  Respiratory: Negative for cough and shortness of breath.   Cardiovascular: Negative for chest pain or palpitations.  Gastrointestinal: Negative for abdominal pain, no bowel changes.  Musculoskeletal: Negative for gait problem or joint swelling.  Skin: Negative for rash.  Neurological: Negative for dizziness or headache.  No other specific complaints in a complete review of systems (except as listed in HPI above).      Objective:    BP 118/72   Pulse 85   Temp 98.5 F (36.9 C) (Oral)   Resp 18   Ht 5' 2"   (1.575 m)   Wt 116 lb 6.4 oz (52.8 kg)   SpO2 98%   BMI 21.29 kg/m   Wt Readings from Last 3 Encounters:  06/01/22 116 lb 6.4 oz (52.8 kg)  04/28/22 114 lb (51.7 kg)  04/21/22 115 lb (52.2 kg)    Physical Exam  Constitutional: Patient appears well-developed and well-nourished.  No distress.  HEENT: head atraumatic, normocephalic, pupils equal and reactive to light,  neck supple Cardiovascular: Normal rate, regular rhythm and normal heart sounds.  No murmur heard. No BLE edema. Pulmonary/Chest: Effort normal and breath sounds normal. No respiratory distress. Breast: appear normal, no masses noted, discharge noted from right nipple, there is tenderness noted to right breast.  Abdominal: Soft.  There is no tenderness. Psychiatric: Patient has a normal mood and affect. behavior is normal. Judgment and thought content normal.  Results for orders placed or performed in visit on 12/03/21  Lipid panel  Result Value Ref Range   Cholesterol 179 <200 mg/dL   HDL 68 > OR = 50 mg/dL   Triglycerides 149 <150 mg/dL   LDL Cholesterol (Calc) 86 mg/dL (calc)   Total CHOL/HDL Ratio 2.6 <5.0 (calc)   Non-HDL Cholesterol (Calc) 111 <130 mg/dL (calc)  CBC with Differential/Platelet  Result Value Ref Range   WBC 9.3  3.8 - 10.8 Thousand/uL   RBC 4.22 3.80 - 5.10 Million/uL   Hemoglobin 13.4 11.7 - 15.5 g/dL   HCT 40.0 35.0 - 45.0 %   MCV 94.8 80.0 - 100.0 fL   MCH 31.8 27.0 - 33.0 pg   MCHC 33.5 32.0 - 36.0 g/dL   RDW 12.0 11.0 - 15.0 %   Platelets 294 140 - 400 Thousand/uL   MPV 9.6 7.5 - 12.5 fL   Neutro Abs 5,617 1,500 - 7,800 cells/uL   Lymphs Abs 2,623 850 - 3,900 cells/uL   Absolute Monocytes 791 200 - 950 cells/uL   Eosinophils Absolute 177 15 - 500 cells/uL   Basophils Absolute 93 0 - 200 cells/uL   Neutrophils Relative % 60.4 %   Total Lymphocyte 28.2 %   Monocytes Relative 8.5 %   Eosinophils Relative 1.9 %   Basophils Relative 1.0 %  COMPLETE METABOLIC PANEL WITH GFR  Result  Value Ref Range   Glucose, Bld 77 65 - 99 mg/dL   BUN 11 7 - 25 mg/dL   Creat 0.88 0.50 - 0.99 mg/dL   eGFR 81 > OR = 60 mL/min/1.36m   BUN/Creatinine Ratio NOT APPLICABLE 6 - 22 (calc)   Sodium 139 135 - 146 mmol/L   Potassium 3.9 3.5 - 5.3 mmol/L   Chloride 103 98 - 110 mmol/L   CO2 27 20 - 32 mmol/L   Calcium 9.0 8.6 - 10.2 mg/dL   Total Protein 6.3 6.1 - 8.1 g/dL   Albumin 3.8 3.6 - 5.1 g/dL   Globulin 2.5 1.9 - 3.7 g/dL (calc)   AG Ratio 1.5 1.0 - 2.5 (calc)   Total Bilirubin 0.6 0.2 - 1.2 mg/dL   Alkaline phosphatase (APISO) 55 31 - 125 U/L   AST 16 10 - 35 U/L   ALT 18 6 - 29 U/L      Assessment & Plan:   Problem List Items Addressed This Visit   None Visit Diagnoses     shingles outbreak    -  Primary   Relevant Medications   valACYclovir (VALTREX) 1000 MG tablet   gabapentin (NEURONTIN) 300 MG capsule   Pramoxine-Calamine 1-3 % LOTN   Nipple discharge       Relevant Orders   [place] UN[place] RIGHT INC AXILLA   MM DIAG BREAST TOMO UNI RIGHT   Breast tenderness       Relevant Orders   [place] L[place] UN[place] RIGHT INC AXILLA   MM DIAG BREAST TOMO UNI RIGHT        Follow up plan: Return if symptoms worsen or fail to improve.

## 2022-06-03 NOTE — Telephone Encounter (Signed)
Requested medication (s) are due for refill today: No  Requested medication (s) are on the active medication list: Yes  Last refill:  06/01/22  Future visit scheduled: Yes  Notes to clinic:  See pharmacy request.    Requested Prescriptions  Pending Prescriptions Disp Refills   [facility] CALAMINE PLUS 1-8 % LOTN [Pharmacy Med Name: [facility] CALAMINE PLUS 1%-8% LOTION] 118 mL 1    Sig: APPLY 1 APPLICATION TOPICALLY IN THE MORNING, AT NOON, IN THE EVENING, AND AT BEDTIME.     Off-Protocol Failed - 06/01/2022 12:22 PM      Failed - Medication not assigned to a protocol, review manually.      Passed - Valid encounter within last 12 months    Recent Outpatient Visits           2 days ago shingles outbreak   [name], [name]   1 month ago Rhinosinusitis   [name], [name]   5 months ago Acute pain of right shoulder   [name], [name]   6 months ago Annual physical exam   [name], [name]   1 year ago Adult general medical exam   [name], [name]       Future Appointments             In 6 months Angela Gordon, Myna Hidalgo, [facility], [facility]

## 2022-06-23 ENCOUNTER — Other Ambulatory Visit: Payer: Self-pay | Admitting: Family Medicine

## 2022-06-23 ENCOUNTER — Inpatient Hospital Stay: Admission: RE | Admit: 2022-06-23 | Payer: BC Managed Care – PPO | Source: Ambulatory Visit

## 2022-06-23 ENCOUNTER — Other Ambulatory Visit: Payer: BC Managed Care – PPO

## 2022-06-23 DIAGNOSIS — J329 Chronic sinusitis, unspecified: Secondary | ICD-10-CM

## 2022-06-29 ENCOUNTER — Ambulatory Visit
Admission: RE | Admit: 2022-06-29 | Discharge: 2022-06-29 | Disposition: A | Discharge status: Home / Self Care | Payer: BC Managed Care – PPO | Source: Ambulatory Visit | Attending: Nurse Practitioner | Admitting: Nurse Practitioner

## 2022-06-29 DIAGNOSIS — N644 Mastodynia: Secondary | ICD-10-CM | POA: Diagnosis present

## 2022-06-29 DIAGNOSIS — N6452 Nipple discharge: Secondary | ICD-10-CM | POA: Insufficient documentation

## 2022-07-10 ENCOUNTER — Other Ambulatory Visit: Payer: BC Managed Care – PPO

## 2022-07-16 ENCOUNTER — Other Ambulatory Visit: Payer: Self-pay | Admitting: Family Medicine

## 2022-07-16 DIAGNOSIS — J310 Chronic rhinitis: Secondary | ICD-10-CM

## 2022-09-23 ENCOUNTER — Encounter: Payer: Self-pay | Admitting: Family Medicine

## 2022-09-23 ENCOUNTER — Ambulatory Visit: Payer: Self-pay | Admitting: *Deleted

## 2022-09-23 ENCOUNTER — Other Ambulatory Visit: Payer: Self-pay | Admitting: Family Medicine

## 2022-09-23 ENCOUNTER — Telehealth (INDEPENDENT_AMBULATORY_CARE_PROVIDER_SITE_OTHER): Payer: BC Managed Care – PPO | Admitting: Family Medicine

## 2022-09-23 ENCOUNTER — Telehealth: Payer: Self-pay | Admitting: Family Medicine

## 2022-09-23 DIAGNOSIS — K909 Intestinal malabsorption, unspecified: Secondary | ICD-10-CM

## 2022-09-23 DIAGNOSIS — R519 Headache, unspecified: Secondary | ICD-10-CM

## 2022-09-23 DIAGNOSIS — R110 Nausea: Secondary | ICD-10-CM

## 2022-09-23 DIAGNOSIS — M810 Age-related osteoporosis without current pathological fracture: Secondary | ICD-10-CM

## 2022-09-23 DIAGNOSIS — E559 Vitamin D deficiency, unspecified: Secondary | ICD-10-CM

## 2022-09-23 DIAGNOSIS — K529 Noninfective gastroenteritis and colitis, unspecified: Secondary | ICD-10-CM

## 2022-09-23 DIAGNOSIS — Z1231 Encounter for screening mammogram for malignant neoplasm of breast: Secondary | ICD-10-CM

## 2022-09-23 DIAGNOSIS — E7800 Pure hypercholesterolemia, unspecified: Secondary | ICD-10-CM

## 2022-09-23 MED ORDER — BACLOFEN 5 MG PO TABS: 5.0000 mg | ORAL_TABLET | Freq: Three times a day (TID) | ORAL | 0 refills | Status: DC | PRN

## 2022-09-23 MED ORDER — PROMETHAZINE HCL 25 MG PO TABS: 12.5000 mg | ORAL_TABLET | Freq: Four times a day (QID) | ORAL | 1 refills | Status: DC | PRN

## 2022-09-23 MED ORDER — ONDANSETRON HCL 4 MG PO TABS: 4.0000 mg | ORAL_TABLET | Freq: Three times a day (TID) | ORAL | 1 refills | Status: DC | PRN

## 2022-09-23 NOTE — Telephone Encounter (Signed)
Disregard

## 2022-09-23 NOTE — Progress Notes (Unsigned)
Name: Angela Gordon   MRN: [id number]    DOB: 1970-12-07   Date:09/23/2022       Progress Note  Subjective:    Chief Complaint  Chief Complaint  Patient presents with   Medication Refill    Requesting Phenergan to have in hand whenever she feels nauseous and headaches. Unable to provide vital signs   Headache   Nausea    I connected with  [name] on 09/23/22 at  3:00 PM EDT by telephone and verified that I am speaking with the correct person using two identifiers.   I discussed the limitations, risks, security and privacy concerns of performing an evaluation and management service by telephone and the availability of in person appointments. Staff also discussed with the patient that there may be a patient responsible charge related to this service.  Patient verbalized understanding and agreed to proceed with encounter. Patient Location: car Provider Location: [facility] Additional Individuals present: none  More frequent HA's a few months ago she was getting them a few times a week, they got better, then this last week having a headache every week  Headache  This is a recurrent problem. Episode onset: more frequent over the past 1-2 years. The pain is located in the Occipital region. The pain quality is similar to prior headaches. The quality of the pain is described as throbbing. Associated symptoms include muscle aches and nausea. Pertinent negatives include no fever, hearing loss, insomnia, loss of balance, numbness, phonophobia, photophobia, rhinorrhea, scalp tenderness, seizures, sinus pressure, sore throat, visual change, vomiting, weakness or weight loss. Associated symptoms comments: Neck MSK tension. Nothing aggravates the symptoms. She has tried acetaminophen for the symptoms.       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    Social History   Tobacco Use   Smoking status: Never   Smokeless tobacco: Never  Substance Use Topics   Alcohol use: Yes    Alcohol/week: 1.0 standard drink of alcohol    Types: 1 Standard drinks or equivalent per week    Comment: rarely     Current Outpatient Medications:    acetaminophen (TYLENOL) 325 MG tablet, Take 325-650 mg by mouth every 6 (six) hours as needed for moderate pain., Disp: , Rfl:    albuterol (VENTOLIN HFA) 108 (90 Base) MCG/ACT inhaler, Inhale 2 puffs into the lungs every 6 (six) hours as needed for wheezing or shortness of breath., Disp: 18 g, Rfl: 2   calcium carbonate (OSCAL) 1500 (600 Ca) MG TABS tablet, Take 600 mg of elemental calcium by mouth 2 (two) times daily., Disp: , Rfl:    Cholecalciferol (VITAMIN D3) 125 MCG (5000 UT) TABS, Take 5,000 Units by mouth every Monday. , Disp: , Rfl:    [employer] CALAMINE PLUS 1-8 % LOTN, APPLY 1 APPLICATION TOPICALLY IN THE MORNING, AT NOON, IN THE EVENING, AND AT BEDTIME., Disp: 118 mL, Rfl: 1   cyanocobalamin 2000 MCG tablet, Take 2,000 mcg by mouth daily., Disp: , Rfl:    cyclobenzaprine (FLEXERIL) 10 MG tablet, Take 1 tablet (10 mg total) by mouth 3 (three) times daily as needed., Disp:  30 tablet, Rfl: 0   dicyclomine (BENTYL) 10 MG capsule, Take 1 capsule (10 mg total) by mouth 2 (two) times daily as needed (abdominal cramps)., Disp: 30 capsule, Rfl: 1   diphenhydrAMINE (BENADRYL) 25 MG tablet, Take 25 mg by mouth daily as needed for allergies., Disp: , Rfl:    gabapentin (NEURONTIN) 300 MG capsule, Take 1 capsule (300 mg total) by mouth 3 (three) times daily., Disp: 30 capsule, Rfl: 0   hydrocortisone (ANUSOL-HC) 25 MG suppository, Place 1  suppository (25 mg total) rectally every 12 (twelve) hours., Disp: 12 suppository, Rfl: 1   hydrocortisone 2.5 % cream, Apply topically 2 (two) times daily., Disp: , Rfl:    levocetirizine (XYZAL) 5 MG tablet, TAKE 1 TABLET BY MOUTH EVERY DAY IN THE EVENING, Disp: 30 tablet, Rfl: 1   meclizine (ANTIVERT) 25 MG tablet, Take 0.5-1 tablets (12.5-25 mg total) by mouth 3 (three) times daily as needed for dizziness., Disp: 30 tablet, Rfl: 0   oxyCODONE-acetaminophen (PERCOCET) 5-325 MG tablet, Take 1 tablet by mouth every 4 (four) hours as needed for severe pain., Disp: 12 tablet, Rfl: 0   polyethylene glycol powder (GLYCOLAX/MIRALAX) 17 GM/SCOOP powder, Take 17 g by mouth 2 (two) times daily as needed., Disp: 3350 g, Rfl: 0   promethazine (PHENERGAN) 25 MG tablet, Take 0.5-1 tablets (12.5-25 mg total) by mouth every 6 (six) hours as needed for nausea or vomiting (headaches)., Disp: 20 tablet, Rfl: 0   tiZANidine (ZANAFLEX) 4 MG tablet, Take 1 tablet (4 mg total) by mouth every 8 (eight) hours as needed for muscle spasms., Disp: 90 tablet, Rfl: 0   omeprazole (PRILOSEC) 20 MG capsule, Take 1 capsule (20 mg total) by mouth daily for 30 days, THEN 1 capsule (20 mg total) daily. (Patient taking differently: Take 1 capsule (20 mg total) by mouth daily for 30 days, THEN 1 capsule (20 mg total) daily. Uses as needed), Disp: 90 capsule, Rfl: 3  Current Facility-Administered Medications:    0.9 %  sodium chloride infusion, 500 mL, Intravenous, Once, Mansouraty, Telford Nab., MD  Allergies  Allergen Reactions   Erythromycin Base Other (See Comments)    Several family members have severe side effects, pt has never taken but should avoid    Penicillins Diarrhea and Rash    Did it involve swelling of the face/tongue/throat, SOB, or low BP? Unknown Did it involve sudden or severe rash/hives, skin peeling, or any reaction on the inside of your mouth or nose? Unknown Did you need to seek medical attention at a  hospital or doctor's office? Unknown When did it last happen?    Childhood   If all above answers are "NO", may proceed with cephalosporin use.   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   Review of Systems  Constitutional: Negative.  Negative for fever and weight loss.  HENT: Negative.  Negative for hearing loss, rhinorrhea, sinus pressure and sore throat.   Eyes: Negative.  Negative for photophobia.  Respiratory: Negative.    Cardiovascular: Negative.   Gastrointestinal:  Positive for nausea. Negative for vomiting.  Endocrine: Negative.   Genitourinary: Negative.   Musculoskeletal: Negative.   Skin: Negative.   Allergic/Immunologic: Negative.   Neurological:  Positive for headaches. Negative for seizures, weakness, numbness and loss of balance.  Hematological: Negative.   Psychiatric/Behavioral: Negative.  The  patient does not have insomnia.   All other systems reviewed and are negative.    Objective:    Virtual encounter, vitals limited, only able to obtain the following There were no vitals filed for this visit. There is no height or weight on file to calculate BMI. Nursing Note and Vital Signs reviewed.  Physical Exam Vitals reviewed.  Neck:     Trachea: Phonation normal.  Psychiatric:        Mood and Affect: Mood normal.     PE limited by telephone encounter  No results found for this or any previous visit (from the past 72 hour(s)).  Assessment and Plan:   1. Nonintractable episodic headache, unspecified headache type Pt having intemittent episodes of HA w/o prior migraine dx A few months ago they were frequent, this past month they have been less freq about 1 a week, sometimes goes away with OTC meds, seems to have some neck tension  triggering or related to her HA No concerning sx or red flags today obtaining verbal hx Encouraged in person visit for exam She can still use OTC meds, encouraged avoiding regularly to prevent medication overuse headache Encouraged her to use her muscle relaxers and heat therapy as well and to keep a headache journal and bring in with her I encouraged her to try Zofran with any headache induced nausea since there will likely be less side effects than with the Phenergan She does have tizanidine, Flexeril and baclofen on her chart she was instructed to only take 1 muscle relaxer when needed  - promethazine (PHENERGAN) 25 MG tablet; Take 0.5-1 tablets (12.5-25 mg total) by mouth every 6 (six) hours as needed for refractory nausea / vomiting (or with headaches).  Dispense: 20 tablet; Refill: 1 - ondansetron (ZOFRAN) 4 MG tablet; Take 1-1.5 tablets (4-6 mg total) by mouth every 8 (eight) hours as needed for nausea or vomiting.  Dispense: 20 tablet; Refill: 1 - Baclofen 5 MG TABS; Take 5 mg by mouth 3 (three) times daily as needed (msk pain or spasms).  Dispense: 30 tablet; Refill: 0  2. Nausea Encouraged her to use Zofran first-line and Phenergan for refractory nausea - promethazine (PHENERGAN) 25 MG tablet; Take 0.5-1 tablets (12.5-25 mg total) by mouth every 6 (six) hours as needed for refractory nausea / vomiting (or with headaches).  Dispense: 20 tablet; Refill: 1 - ondansetron (ZOFRAN) 4 MG tablet; Take 1-1.5 tablets (4-6 mg total) by mouth every 8 (eight) hours as needed for nausea or vomiting.  Dispense: 20 tablet; Refill: 1   -Red flags and when to present for emergency care or RTC including but not limited to new/worsening/un-resolving symptoms,  reviewed with patient at time of visit. Follow up and care instructions discussed and provided in AVS. - I discussed the assessment and treatment plan with the patient. The patient was provided an opportunity to ask questions and all were answered.  The patient agreed with the plan and demonstrated an understanding of the instructions.  - The patient was advised to call back or seek an in-person evaluation if the symptoms worsen or if the condition fails to improve as anticipated.  I provided 18 minutes of non-face-to-face time during this encounter.  Delsa Grana, [name] 09/23/22 2:55 PM

## 2022-09-23 NOTE — Telephone Encounter (Signed)
  Chief Complaint: medication request Symptoms: headache- nausea Frequency: intermittent - yesterday Pertinent Negatives: Patient denies headache.nausea/vomiting today Disposition: '[]'$ ED /'[]'$ Urgent Care (no appt availability in office) / '[]'$ Appointment(In office/virtual)/ '[]'$  [facility]/ '[]'$ Home Care/ '[]'$ Refused Recommended Disposition /'[]'$ [facility]/ '[x]'$  Follow-up with PCP Additional Notes: Patient is requesting refill of her phenergan to have on hand when she has headache with vomiting.

## 2022-09-23 NOTE — Patient Instructions (Signed)
Keep a headache journal - or put notes in phone with details around when you get headaches - how severe, location, duration Any things that could have triggered it, lack of sleep, stress, foods, dehydration etc  Try using a heating pad and one of your muscle relaxers to help sooth neck muscles that may be triggering your headaches You can continue to use tylenol or excedrine migraine over the counter - but try to avoid using them frequently or more than 8-10 x a month - this can start to CAUSE headaches  Come in person to be examined (neuro exam needed), bring headache journal/details, and we can recheck your labs and deficiencies when you are here.    Form - Headache Record There are many types and causes of headaches. A headache record can help guide your treatment plan. Use this form to record the details. Bring this form with you to your follow-up visits. Follow your health care provider's instructions on how to describe your headache. You may be asked to: Use a pain scale. This is a tool to rate the intensity of your headache using words or numbers. Describe what your headache feels like, such as dull, achy, throbbing, or sharp. Headache record Date: _______________ Time (from start to end): ____________________ Location of the headache: _________________________ Intensity of the headache: ____________________ Description of the headache: ______________________________________________________________ Hours of sleep the night before the headache: __________ Food or drinks before the headache started: ______________________________________________________________________________________ Events before the headache started: _______________________________________________________________________________________________ Symptoms before the headache started: __________________________________________________________________________________________ Symptoms during the headache:  __________________________________________________________________________________________________ Treatment: ________________________________________________________________________________________________________________ Effect of treatment: _________________________________________________________________________________________________________ Other comments: ___________________________________________________________________________________________________________ Date: _______________ Time (from start to end): ____________________ Location of the headache: _________________________ Intensity of the headache: ____________________ Description of the headache: ______________________________________________________________ Hours of sleep the night before the headache: __________ Food or drinks before the headache started: ______________________________________________________________________________________ Events before the headache started: ____________________________________________________________________________________________ Symptoms before the headache started: _________________________________________________________________________________________ Symptoms during the headache: _______________________________________________________________________________________________ Treatment: ________________________________________________________________________________________________________________ Effect of treatment: _________________________________________________________________________________________________________ Other comments: ___________________________________________________________________________________________________________ Date: _______________ Time (from start to end): ____________________ Location of the headache: _________________________ Intensity of the headache: ____________________ Description of the headache:  ______________________________________________________________ Hours of sleep the night before the headache: __________ Food or drinks before the headache started: ______________________________________________________________________________________ Events before the headache started: ____________________________________________________________________________________________ Symptoms before the headache started: _________________________________________________________________________________________ Symptoms during the headache: _______________________________________________________________________________________________ Treatment: ________________________________________________________________________________________________________________ Effect of treatment: _________________________________________________________________________________________________________ Other comments: ___________________________________________________________________________________________________________ Date: _______________ Time (from start to end): ____________________ Location of the headache: _________________________ Intensity of the headache: ____________________ Description of the headache: ______________________________________________________________ Hours of sleep the night before the headache: _________ Food or drinks before the headache started: ______________________________________________________________________________________ Events before the headache started: ____________________________________________________________________________________________ Symptoms before the headache started: _________________________________________________________________________________________ Symptoms during the headache: _______________________________________________________________________________________________ Treatment:  ________________________________________________________________________________________________________________ Effect of treatment: _________________________________________________________________________________________________________ Other comments: ___________________________________________________________________________________________________________ Date: _______________ Time (from start to end): ____________________ Location of the headache: _________________________ Intensity of the headache: ____________________ Description of the headache: ______________________________________________________________ Hours of sleep the night before the headache: _________ Food or drinks before the headache started: ______________________________________________________________________________________ Events before the headache started: ____________________________________________________________________________________________ Symptoms before the headache started: _________________________________________________________________________________________ Symptoms during the headache: _______________________________________________________________________________________________ Treatment: ________________________________________________________________________________________________________________ Effect of treatment: _________________________________________________________________________________________________________ Other comments: ___________________________________________________________________________________________________________ This information is not intended to replace advice given to you by your health care provider. Make sure you discuss any questions you have with your health care provider. Document Revised: 04/16/2021 Document Reviewed: 04/16/2021 Elsevier Patient Education  [place].   Tension Headache, Adult A tension headache is a feeling of pain, pressure, or  aching in the head. It is often felt over the front and sides of the head. Tension headaches can last from 30 minutes to several days. What are the causes? The cause of this condition is not known. Sometimes, tension headaches are brought on by stress, worry (anxiety), or depression. Other things that may set them off include: Alcohol. Too much caffeine or caffeine withdrawal. Colds, flu, or sinus infections. Dental problems. This can include clenching your teeth. Being tired. Holding your head and neck in the same position for a long time, such as while using a computer. Smoking. Arthritis in the neck. What are the signs or symptoms? Feeling pressure around the head. A dull ache in the head. Pain over the front and sides of the head. Feeling sore or tender in the muscles of the head, neck, and shoulders. How is this treated? This condition may be treated with lifestyle changes and with medicines that help relieve symptoms. Follow these instructions at home: Managing pain Take over-the-counter and prescription medicines only as told by your doctor. When you have a headache, lie down in a dark, quiet room. If told, put ice on your head and neck. To do this: Put ice in a plastic bag. Place a towel between your skin and the bag. Leave the ice on for 20 minutes, 2-3 times a day. Take off the ice if your skin turns bright red. This is very important. If you cannot feel pain, heat, or cold, you have a greater risk of damage to the area. If told, put heat on the back of your neck. Do this as often as told by your doctor. Use the heat source that your doctor recommends, such as a moist heat pack or a heating pad. Place a towel between your skin and the heat source. Leave the heat on for 20-30 minutes. Take off the heat if your skin turns bright red. This is very important. If you cannot feel pain, heat, or cold, you have a greater risk of getting burned. Eating and drinking Eat meals on a  regular schedule. If you drink alcohol: Limit how much you have to: 0-1 drink a day for women who are not pregnant. 0-2 drinks a day for men. Know how much alcohol is in your drink. In the [place] one drink equals one 12 oz bottle of beer (355 mL), one 5 oz glass of wine (148 mL), or one 1 oz glass of hard liquor (44 mL). Drink enough fluid to keep your pee (urine) pale yellow. Do not use a lot of caffeine, or stop using caffeine. Lifestyle Get 7-9 hours of sleep each night. Or get the amount of sleep that your doctor tells you to. At bedtime, keep computers, phones, and tablets out of your room. Find ways to lessen your stress. This may include: Exercise. Deep breathing. Yoga. Listening to music. Thinking positive thoughts. Sit up straight. Try to relax your muscles. Do not smoke or use any products that contain nicotine or tobacco. If you need help quitting, ask your doctor. General instructions  Avoid things that can bring on headaches. Keep a headache journal to see what may bring on headaches. For example, write down: What you eat and drink. How much sleep you get. Any change to your diet or medicines. Keep all follow-up visits. Contact a doctor if: Your headache does not get better. Your headache comes back. You have a headache, and sounds, light, or smells bother you. You feel like you may vomit, or you  vomit. Your stomach hurts. Get help right away if: You all of a sudden get a very bad headache with any of these things: A stiff neck. Feeling like you may vomit. Vomiting. Feeling mixed up (confused). Feeling weak in one part or one side of your body. Having trouble seeing or speaking, or both. Feeling short of breath. A rash. Feeling very sleepy. Pain in your eye or ear. Trouble walking or balancing. Feeling like you will faint, or you faint. Summary A tension headache is pain, pressure, or aching in your head. Tension headaches can last from 30 minutes to  several days. Lifestyle changes and medicines may help relieve pain. This information is not intended to replace advice given to you by your health care provider. Make sure you discuss any questions you have with your health care provider. Document Revised: 08/15/2020 Document Reviewed: 08/15/2020 Elsevier Patient Education  [place].

## 2022-09-23 NOTE — Telephone Encounter (Signed)
Pt notified needs an appointment transferred to [facility]

## 2022-09-23 NOTE — Telephone Encounter (Signed)
Summary: Migraines/vomiting Rx request   Has occasional migraines, unable to keep anything down. Headaches too bad. Last night she had an episode.   Medication Refill - Medication: promethazine (PHENERGAN) 25 MG tablet   Has the patient contacted their pharmacy? Yes.    (Agent: If no, request that the patient contact the pharmacy for the refill. If patient does not wish to contact the pharmacy document the reason why and proceed with request.)  (Agent: If yes, when and what did the pharmacy advise?)   Preferred Pharmacy (with phone number or street name):  [id number] [place], [place]- [id number] Phone: [phone]: [phone]  Has the patient been seen for an appointment in the last year OR does the patient have an upcoming appointment? Yes.     Agent: Please be advised that RX refills may take up to 3 business days. We ask that you follow-up with your pharmacy.      Reason for Disposition . Similar to previously diagnosed muscle-tension headaches . Caller requesting a CONTROLLED substance prescription refill (e.g., narcotics, ADHD medicines)  Answer Assessment - Initial Assessment Questions 1. LOCATION: "Where does it hurt?"      Started in temple- moved down neck 2. ONSET: "When did the headache start?" (Minutes, hours or days)      yesterday 3. PATTERN: "Does the pain come and go, or has it been constant since it started?"     Patient was able to take OTC pain medication and headache is gone 4. SEVERITY: "How bad is the pain?" and "What does it keep you from doing?"  (e.g., Scale 1-10; mild, moderate, or severe)   - MILD (1-3): doesn't interfere with normal activities    - MODERATE (4-7): interferes with normal activities or awakens from sleep    - SEVERE (8-10): excruciating pain, unable to do any normal activities        No pain now 5. RECURRENT SYMPTOM: "Have you ever had headaches before?" If Yes, ask: "When was the  last time?" and "What happened that time?"      yes 6. CAUSE: "What do you think is causing the headache?"     Tension headache 7. MIGRAINE: "Have you been diagnosed with migraine headaches?" If Yes, ask: "Is this headache similar?"      Yes- not in eyes 8. HEAD INJURY: "Has there been any recent injury to the head?"      no 9. OTHER SYMPTOMS: "Do you have any other symptoms?" (fever, stiff neck, eye pain, sore throat, cold symptoms)     Nausea and vomiting is better-now 10. PREGNANCY: "Is there any chance you are pregnant?" "When was your last menstrual period?"  Answer Assessment - Initial Assessment Questions 1. DRUG NAME: "What medicine do you need to have refilled?"     promethazine (PHENERGAN) 25 MG tablet 12.5-25 mg, Every 6 hours PRN  2. REFILLS REMAINING: "How many refills are remaining?" (Note: The label on the medicine or pill bottle will show how many refills are remaining. If there are no refills remaining, then a renewal may be needed.)     none 3. EXPIRATION DATE: "What is the expiration date?" (Note: The label states when the prescription will expire, and thus can no longer be refilled.)       4. PRESCRIBING HCP: "Who prescribed it?" Reason: If prescribed by specialist, call should be referred to that group.     PCP 5.  SYMPTOMS: "Do you have any symptoms?"     Headache/migraine with nausea/vomiting Patient is requesting refill- she had headache yesterday and took her last pill  Protocols used: Headache-A-AH, Medication Refill and Renewal Call-A-AH

## 2022-10-19 ENCOUNTER — Other Ambulatory Visit: Payer: Self-pay | Admitting: Family Medicine

## 2022-10-19 ENCOUNTER — Ambulatory Visit (INDEPENDENT_AMBULATORY_CARE_PROVIDER_SITE_OTHER): Payer: BC Managed Care – PPO | Admitting: Family Medicine

## 2022-10-19 ENCOUNTER — Encounter: Payer: Self-pay | Admitting: Family Medicine

## 2022-10-19 VITALS — BP 136/80 | HR 94 | Temp 98.1°F | Resp 16 | Ht 62.0 in | Wt 111.4 lb

## 2022-10-19 DIAGNOSIS — Z780 Asymptomatic menopausal state: Secondary | ICD-10-CM | POA: Diagnosis not present

## 2022-10-19 DIAGNOSIS — Z114 Encounter for screening for human immunodeficiency virus [HIV]: Secondary | ICD-10-CM

## 2022-10-19 DIAGNOSIS — R519 Headache, unspecified: Secondary | ICD-10-CM

## 2022-10-19 DIAGNOSIS — Z23 Encounter for immunization: Secondary | ICD-10-CM

## 2022-10-19 DIAGNOSIS — M542 Cervicalgia: Secondary | ICD-10-CM | POA: Diagnosis not present

## 2022-10-19 MED ORDER — BACLOFEN 5 MG PO TABS: 5.0000 mg | ORAL_TABLET | Freq: Three times a day (TID) | ORAL | 2 refills | Status: DC | PRN

## 2022-10-19 NOTE — Telephone Encounter (Unsigned)
Copied from [place] [id number]. Topic: General - Other >> Oct 19, 2022  4:46 PM Everette C wrote: Reason for CRM: Medication Refill - Medication: cyclobenzaprine (FLEXERIL) 10 MG tablet [[id number]]  Has the patient contacted their pharmacy? Yes.  The patient has been directed to contact their PCP  (Agent: If no, request that the patient contact the pharmacy for the refill. If patient does not wish to contact the pharmacy document the reason why and proceed with request.) (Agent: If yes, when and what did the pharmacy advise?)  Preferred Pharmacy (with phone number or street name): [id number] [place], [place]- [id number]: [phone]: [phone]: Not open 24 hours   Has the patient been seen for an appointment in the last year OR does the patient have an upcoming appointment? Yes.    Agent: Please be advised that RX refills may take up to 3 business days. We ask that you follow-up with your pharmacy.

## 2022-10-19 NOTE — Progress Notes (Unsigned)
Patient ID: Angela Gordon, female    DOB: 07-18-1971, 51 y.o.   MRN: [id number]  PCP: Delsa Grana, [name]  Chief Complaint  Patient presents with   Follow-up   Headache    Pt states it has not been as it was on previous visit.    Subjective:   Angela Gordon is a 51 y.o. female, presents to clinic with CC of the following:  HPI  Patient presents for follow-up on headaches after a virtual appointment about a month ago HA's for the past month have gradually improved, not as frequent or as severe as 1 month ago.    Prior hx: More frequent HA's a few months ago she was getting them a few times a week, they got better, then this last week having a headache every week   If her neck hurts she is using the baclofen - used 8 ran out If that doesn't work she will take the phenergan - used 10 total    Headache  This is a recurrent problem. Episode onset: more frequent over the past 1-2 years. The pain is located in the Occipital region. The pain quality is similar to prior headaches. The quality of the pain is described as throbbing. Associated symptoms include muscle aches and nausea. Pertinent negatives include no fever, hearing loss, insomnia, loss of balance, numbness, phonophobia, photophobia, rhinorrhea, scalp tenderness, seizures, sinus pressure, sore throat, visual change, vomiting, weakness or weight loss. Associated symptoms comments: Neck MSK tension. Nothing aggravates the symptoms. She has tried acetaminophen for the symptoms.   Neck tension/pain - massage, baclofen and heat does help  HA's occur sometim es in the middle of the night She used to have migraines that were severe and she would need to be in a dark room and rest and they went away     Patient Active Problem List   Diagnosis Date Noted   Pure hypercholesterolemia 11/27/2020   Cap polyposis ([place]) 07/27/2020   Unintentional weight loss 07/27/2020   Intestinal malabsorption 05/31/2020   Hypocalcemia  05/31/2020   Anemia 05/31/2020   B12 deficiency 05/31/2020   IBD (inflammatory bowel disease) 05/31/2020   Vitamin D deficiency 05/07/2016   Sickle-cell trait ([place]) 05/07/2016   Osteoporosis, 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      Current Outpatient Medications:    acetaminophen (TYLENOL) 325 MG tablet, Take 325-650 mg by mouth every 6 (six) hours as needed for moderate pain., Disp: , Rfl:    albuterol (VENTOLIN HFA) 108 (90 Base) MCG/ACT inhaler, Inhale 2 puffs into the lungs every 6 (six) hours as needed for wheezing or shortness of breath., Disp: 18 g, Rfl: 2   Baclofen 5 MG TABS, Take 5 mg by mouth 3 (three) times daily as needed (msk pain or spasms)., Disp: 30 tablet, Rfl: 0   calcium carbonate (OSCAL) 1500 (600 Ca) MG TABS tablet, Take 600 mg of elemental calcium by mouth 2 (two) times daily., Disp: , Rfl:    Cholecalciferol (VITAMIN D3) 125 MCG (5000 UT) TABS, Take 5,000 Units by mouth every Monday. , Disp: , Rfl:    [facility] CALAMINE PLUS 1-8 % LOTN, APPLY 1 APPLICATION TOPICALLY IN THE MORNING, AT NOON, IN THE EVENING, AND AT BEDTIME., Disp: 118 mL, Rfl: 1   cyanocobalamin 2000 MCG tablet, Take 2,000 mcg by mouth daily., Disp: , Rfl:    dicyclomine (BENTYL) 10 MG capsule, Take 1 capsule (10 mg total) by mouth 2 (two) times daily as needed (abdominal cramps)., Disp: 30 capsule, Rfl:  1   diphenhydrAMINE (BENADRYL) 25 MG tablet, Take 25 mg by mouth daily as needed for allergies., Disp: , Rfl:    gabapentin (NEURONTIN) 300 MG capsule, Take 1 capsule (300 mg total) by mouth 3 (three) times daily., Disp: 30 capsule, Rfl: 0   hydrocortisone (ANUSOL-HC) 25 MG suppository, Place 1 suppository (25 mg total) rectally every 12 (twelve) hours., Disp: 12 suppository, Rfl: 1   hydrocortisone 2.5 % cream, Apply topically 2 (two) times daily., Disp: , Rfl:    levocetirizine (XYZAL) 5 MG tablet, TAKE 1 TABLET BY MOUTH EVERY DAY IN THE EVENING, Disp: 30 tablet, Rfl: 1   meclizine (ANTIVERT) 25 MG tablet, Take  0.5-1 tablets (12.5-25 mg total) by mouth 3 (three) times daily as needed for dizziness., Disp: 30 tablet, Rfl: 0   ondansetron (ZOFRAN) 4 MG tablet, Take 1-1.5 tablets (4-6 mg total) by mouth every 8 (eight) hours as needed for nausea or vomiting., Disp: 20 tablet, Rfl: 1   oxyCODONE-acetaminophen (PERCOCET) 5-325 MG tablet, Take 1 tablet by mouth every 4 (four) hours as needed for severe pain., Disp: 12 tablet, Rfl: 0   polyethylene glycol powder (GLYCOLAX/MIRALAX) 17 GM/SCOOP powder, Take 17 g by mouth 2 (two) times daily as needed., Disp: 3350 g, Rfl: 0   promethazine (PHENERGAN) 25 MG tablet, Take 0.5-1 tablets (12.5-25 mg total) by mouth every 6 (six) hours as needed for refractory nausea / vomiting (or with headaches)., Disp: 20 tablet, Rfl: 1   omeprazole (PRILOSEC) 20 MG capsule, Take 1 capsule (20 mg total) by mouth daily for 30 days, THEN 1 capsule (20 mg total) daily. (Patient taking differently: Take 1 capsule (20 mg total) by mouth daily for 30 days, THEN 1 capsule (20 mg total) daily. Uses as needed), Disp: 90 capsule, Rfl: 3  Current Facility-Administered Medications:    0.9 %  sodium chloride infusion, 500 mL, Intravenous, Once, Mansouraty, Telford Nab., MD   Allergies  Allergen Reactions   Erythromycin Base Other (See Comments)    Several family members have severe side effects, pt has never taken but should avoid    Penicillins Diarrhea and Rash    Did it involve swelling of the face/tongue/throat, SOB, or low BP? Unknown Did it involve sudden or severe rash/hives, skin peeling, or any reaction on the inside of your mouth or nose? Unknown Did you need to seek medical attention at a hospital or doctor's office? Unknown When did it last happen?    Childhood   If all above answers are "NO", may proceed with cephalosporin use.   Scallops [Shellfish Allergy] Anaphylaxis     Social History   Tobacco Use   Smoking status: Never   Smokeless tobacco: Never  Vaping Use   Vaping  Use: Never used  Substance Use Topics   Alcohol use: Yes    Alcohol/week: 1.0 standard drink of alcohol    Types: 1 Standard drinks or equivalent per week    Comment: rarely   Drug use: No      Chart Review Today: I personally reviewed active problem list, medication list, allergies, family history, social history, health maintenance, notes from last encounter, lab results, imaging with the patient/caregiver today.   Review of Systems  Constitutional: Negative.   HENT: Negative.    Eyes: Negative.   Respiratory: Negative.    Cardiovascular: Negative.   Gastrointestinal: Negative.   Endocrine: Negative.   Genitourinary: Negative.   Musculoskeletal: Negative.   Skin: Negative.   Allergic/Immunologic: Negative.   Neurological: Negative.  Hematological: Negative.   Psychiatric/Behavioral: Negative.    All other systems reviewed and are negative.      Objective:   Vitals:   10/19/22 0950  BP: 136/80  Pulse: 94  Resp: 16  Temp: 98.1 F (36.7 C)  TempSrc: Oral  SpO2: 100%  Weight: 111 lb 6.4 oz (50.5 kg)  Height: _0  (1.575 m)    Body mass index is 20.38 kg/m.  Physical Exam Vitals and nursing note reviewed.  Constitutional:      General: She is not in acute distress.    Appearance: Normal appearance. She is well-developed. She is not ill-appearing, toxic-appearing or diaphoretic.  HENT:     Head: Normocephalic and atraumatic.     Right Ear: Tympanic membrane, ear canal and external ear normal. There is no impacted cerumen.     Left Ear: Tympanic membrane, ear canal and external ear normal. There is no impacted cerumen.     Nose: Nose normal. No congestion or rhinorrhea.     Mouth/Throat:     Mouth: Mucous membranes are moist.     Pharynx: Oropharynx is clear. No oropharyngeal exudate.  Eyes:     General: No scleral icterus.       Right eye: No discharge.        Left eye: No discharge.     Conjunctiva/sclera: Conjunctivae normal.     Pupils: Pupils are  equal, round, and reactive to light.  Neck:     Trachea: Trachea and phonation normal. No tracheal deviation.  Cardiovascular:     Rate and Rhythm: Normal rate and regular rhythm.     Pulses: Normal pulses.     Heart sounds: Normal heart sounds.  Pulmonary:     Effort: Pulmonary effort is normal. No respiratory distress.     Breath sounds: Normal breath sounds. No stridor.  Abdominal:     General: Bowel sounds are normal.     Palpations: Abdomen is soft.  Musculoskeletal:        General: Normal range of motion.     Cervical back: Normal range of motion and neck supple. No edema, erythema or rigidity. Muscular tenderness present. No pain with movement or spinous process tenderness. Normal range of motion.  Lymphadenopathy:     Cervical: No cervical adenopathy.  Skin:    General: Skin is warm and dry.     Findings: No rash.  Neurological:     Mental Status: She is alert. Mental status is at baseline.     Motor: No abnormal muscle tone.     Coordination: Coordination normal.     Gait: Gait normal.     Comments: MENTAL STATUS: AAOx3, memory intact, fund of knowledge appropriate  LANG/SPEECH: Naming and repetition intact, fluent, no dysarthria, follows 3-step commands, answers questions appropriately  CRANIAL NERVES:   II: Pupils equal and reactive, no RAPD   III, IV, VI: EOM intact, no gaze preference or deviation, no nystagmus.   V: normal sensation in V1, V2, and V3 segments bilaterally   VII: no asymmetry, no nasolabial fold flattening   VIII: normal hearing to speech   IX, X: normal palatal elevation, no uvular deviation   XI: 5/5 head turn and 5/5 shoulder shrug bilaterally   XII: midline tongue protrusion  MOTOR:  5/5 bilateral grip strength 5/5 strength dorsiflexion/plantarflexion b/l  SENSORY:  Normal to light touch Romberg absent  COORD: no tremor, no dysmetria  STATION: normal stance, no truncal ataxia  GAIT: Normal;     Psychiatric:  Mood and  Affect: Mood normal.        Behavior: Behavior normal.      Results for orders placed or performed in visit on 12/03/21  Lipid panel  Result Value Ref Range   Cholesterol 179 <200 mg/dL   HDL 68 > OR = 50 mg/dL   Triglycerides 149 <150 mg/dL   LDL Cholesterol (Calc) 86 mg/dL (calc)   Total CHOL/HDL Ratio 2.6 <5.0 (calc)   Non-HDL Cholesterol (Calc) 111 <130 mg/dL (calc)  CBC with Differential/Platelet  Result Value Ref Range   WBC 9.3 3.8 - 10.8 Thousand/uL   RBC 4.22 3.80 - 5.10 Million/uL   Hemoglobin 13.4 11.7 - 15.5 g/dL   HCT 40.0 35.0 - 45.0 %   MCV 94.8 80.0 - 100.0 fL   MCH 31.8 27.0 - 33.0 pg   MCHC 33.5 32.0 - 36.0 g/dL   RDW 12.0 11.0 - 15.0 %   Platelets 294 140 - 400 Thousand/uL   MPV 9.6 7.5 - 12.5 fL   Neutro Abs 5,617 1,500 - 7,800 cells/uL   Lymphs Abs 2,623 850 - 3,900 cells/uL   Absolute Monocytes 791 200 - 950 cells/uL   Eosinophils Absolute 177 15 - 500 cells/uL   Basophils Absolute 93 0 - 200 cells/uL   Neutrophils Relative % 60.4 %   Total Lymphocyte 28.2 %   Monocytes Relative 8.5 %   Eosinophils Relative 1.9 %   Basophils Relative 1.0 %  COMPLETE METABOLIC PANEL WITH GFR  Result Value Ref Range   Glucose, Bld 77 65 - 99 mg/dL   BUN 11 7 - 25 mg/dL   Creat 0.88 0.50 - 0.99 mg/dL   eGFR 81 > OR = 60 mL/min/1.24m   BUN/Creatinine Ratio NOT APPLICABLE 6 - 22 (calc)   Sodium 139 135 - 146 mmol/L   Potassium 3.9 3.5 - 5.3 mmol/L   Chloride 103 98 - 110 mmol/L   CO2 27 20 - 32 mmol/L   Calcium 9.0 8.6 - 10.2 mg/dL   Total Protein 6.3 6.1 - 8.1 g/dL   Albumin 3.8 3.6 - 5.1 g/dL   Globulin 2.5 1.9 - 3.7 g/dL (calc)   AG Ratio 1.5 1.0 - 2.5 (calc)   Total Bilirubin 0.6 0.2 - 1.2 mg/dL   Alkaline phosphatase (APISO) 55 31 - 125 U/L   AST 16 10 - 35 U/L   ALT 18 6 - 29 U/L       Assessment & Plan:   1. Nonintractable episodic headache, unspecified headache type Patient does have a history of migraines but this improved some years ago this  does not have the same pattern or severity she describes the headaches as a band around her head and sometimes triggered from neck tension She recently had worse sleep and this did not worsen her headaches so she does not think it migraines like she used to have She may possibly consider massage therapy or physical therapy to assess cervicalgia, neck tension, neck pain and headaches She has good range of motion of her neck on exam, very tense muscle compartments cervical paraspinal and upper trapezius muscles are very tense, this has improved with over-the-counter tension headache medication which [facility], she is also gotten some relief with the muscle relaxers -will send in a refill of muscle relaxers Consider screening x-rays or PT consult Neurology or headache specialist is also an option I did review with her that if she continues to have chronic neck  pain tension and tension headaches we may want to try nortriptyline or amitriptyline as an option but since she has had less headache frequency and severity at this time she will continue with her current treatment plan of over-the-counter medications, heat, massage and muscle relaxers - Baclofen 5 MG TABS; Take 5 mg by mouth 3 (three) times daily as needed (msk pain or spasms).  Dispense: 30 tablet; Refill: 2  2. Neck pain, musculoskeletal See the plan above at #1  3. Need for influenza vaccination  - Flu Vaccine QUAD 54moIM (Fluarix, Fluzone & Alfiuria Quad PF)  4. Screening for HIV without presence of risk factors Not done today  5. Postmenopausal estrogen deficiency Appears that she is due for bone density follow-up, ordered today - DG Bone Density; Future      LDelsa Grana [name] 10/19/22 10:13 AM

## 2022-10-20 ENCOUNTER — Other Ambulatory Visit: Payer: Self-pay | Admitting: Family Medicine

## 2022-10-20 ENCOUNTER — Encounter: Payer: Self-pay | Admitting: Family Medicine

## 2022-10-20 NOTE — Telephone Encounter (Signed)
Requested medications are due for refill today.  unsure  Requested medications are on the active medications list.  no  Last refill. unknown  Future visit scheduled.   yes  Notes to clinic.  Historical med/provider. Medication refill not delegated.    Requested Prescriptions  Pending Prescriptions Disp Refills   cyclobenzaprine (FLEXERIL) 10 MG tablet 30 tablet     Sig: Take 1 tablet (10 mg total) by mouth 3 (three) times daily as needed for muscle spasms.     Not Delegated - Analgesics:  Muscle Relaxants Failed - 10/19/2022  5:39 PM      Failed - This refill cannot be delegated      Passed - Valid encounter within last 6 months    Recent Outpatient Visits           Yesterday Nonintractable episodic headache, unspecified headache type   [facility] Delsa Grana, [name]   3 weeks ago Nonintractable episodic headache, unspecified headache type   [facility] Delsa Grana, [name]   4 months ago shingles outbreak   [facility] Bo Merino, [name]   5 months ago Rhinosinusitis   [facility] Delsa Grana, [name]   10 months ago Acute pain of right shoulder   [place], [name]       Future Appointments             In 1 month Reece Packer, Myna Hidalgo, [facility], [place]   In 1 month Delsa Grana, [name] [facility]            Signed Prescriptions Disp Refills   cyclobenzaprine (FLEXERIL) 10 MG tablet      Sig: Take 10 mg by mouth 3 (three) times daily as needed for muscle spasms.     There is no refill protocol information for this order

## 2022-10-21 NOTE — Patient Instructions (Signed)
Tension Headache, Adult A tension headache is a feeling of pain, pressure, or aching in the head. It is often felt over the front and sides of the head. Tension headaches can last from 30 minutes to several days. What are the causes? The cause of this condition is not known. Sometimes, tension headaches are brought on by stress, worry (anxiety), or depression. Other things that may set them off include: Alcohol. Too much caffeine or caffeine withdrawal. Colds, flu, or sinus infections. Dental problems. This can include clenching your teeth. Being tired. Holding your head and neck in the same position for a long time, such as while using a computer. Smoking. Arthritis in the neck. What are the signs or symptoms? Feeling pressure around the head. A dull ache in the head. Pain over the front and sides of the head. Feeling sore or tender in the muscles of the head, neck, and shoulders. How is this treated? This condition may be treated with lifestyle changes and with medicines that help relieve symptoms. Follow these instructions at home: Managing pain Take over-the-counter and prescription medicines only as told by your doctor. When you have a headache, lie down in a dark, quiet room. If told, put ice on your head and neck. To do this: Put ice in a plastic bag. Place a towel between your skin and the bag. Leave the ice on for 20 minutes, 2-3 times a day. Take off the ice if your skin turns bright red. This is very important. If you cannot feel pain, heat, or cold, you have a greater risk of damage to the area. If told, put heat on the back of your neck. Do this as often as told by your doctor. Use the heat source that your doctor recommends, such as a moist heat pack or a heating pad. Place a towel between your skin and the heat source. Leave the heat on for 20-30 minutes. Take off the heat if your skin turns bright red. This is very important. If you cannot feel pain, heat, or cold, you  have a greater risk of getting burned. Eating and drinking Eat meals on a regular schedule. If you drink alcohol: Limit how much you have to: 0-1 drink a day for women who are not pregnant. 0-2 drinks a day for men. Know how much alcohol is in your drink. In the [place] one drink equals one 12 oz bottle of beer (355 mL), one 5 oz glass of wine (148 mL), or one 1 oz glass of hard liquor (44 mL). Drink enough fluid to keep your pee (urine) pale yellow. Do not use a lot of caffeine, or stop using caffeine. Lifestyle Get 7-9 hours of sleep each night. Or get the amount of sleep that your doctor tells you to. At bedtime, keep computers, phones, and tablets out of your room. Find ways to lessen your stress. This may include: Exercise. Deep breathing. Yoga. Listening to music. Thinking positive thoughts. Sit up straight. Try to relax your muscles. Do not smoke or use any products that contain nicotine or tobacco. If you need help quitting, ask your doctor. General instructions  Avoid things that can bring on headaches. Keep a headache journal to see what may bring on headaches. For example, write down: What you eat and drink. How much sleep you get. Any change to your diet or medicines. Keep all follow-up visits. Contact a doctor if: Your headache does not get better. Your headache comes back. You have a headache, and sounds,   light, or smells bother you. You feel like you may vomit, or you vomit. Your stomach hurts. Get help right away if: You all of a sudden get a very bad headache with any of these things: A stiff neck. Feeling like you may vomit. Vomiting. Feeling mixed up (confused). Feeling weak in one part or one side of your body. Having trouble seeing or speaking, or both. Feeling short of breath. A rash. Feeling very sleepy. Pain in your eye or ear. Trouble walking or balancing. Feeling like you will faint, or you faint. Summary A tension headache is pain, pressure,  or aching in your head. Tension headaches can last from 30 minutes to several days. Lifestyle changes and medicines may help relieve pain. This information is not intended to replace advice given to you by your health care provider. Make sure you discuss any questions you have with your health care provider. Document Revised: 08/15/2020 Document Reviewed: 08/15/2020 Elsevier Patient Education  [place].  Musculoskeletal Pain Musculoskeletal pain refers to aches and pains in your bones, joints, muscles, and the tissues that surround them. This pain can occur in any part of the body. It can last for a short time (acute) or a long time (chronic). A physical exam, lab tests, and imaging studies may be done to find the cause of your musculoskeletal pain. Follow these instructions at home: Lifestyle Try to control or lower your stress levels. Stress increases muscle tension and can worsen musculoskeletal pain. It is important to recognize when you are anxious or stressed and learn ways to manage it. This may include: Meditation or yoga. Cognitive or behavioral therapy. Acupuncture or massage therapy. You may continue all activities unless the activities cause more pain. When the pain gets better, slowly resume your normal activities. Gradually increase the intensity and duration of your activities or exercise. Managing pain, stiffness, and swelling     Treatment may include medicines for pain and inflammation that are taken by mouth or applied to the skin. Take over-the-counter and prescription medicines only as told by your health care provider. When your pain is severe, bed rest may be helpful. Lie or sit in any position that is comfortable, but get out of bed and walk around at least every couple of hours. If directed, apply heat to the affected area as often as told by your health care provider. Use the heat source that your health care provider recommends, such as a moist heat pack or  a heating pad. Place a towel between your skin and the heat source. Leave the heat on for 20-30 minutes. Remove the heat if your skin turns bright red. This is especially important if you are unable to feel pain, heat, or cold. You may have a greater risk of getting burned. If directed, put ice on the painful area. To do this: Put ice in a plastic bag. Place a towel between your skin and the bag. Leave the ice on for 20 minutes, 2-3 times a day. Remove the ice if your skin turns bright red. This is very important. If you cannot feel pain, heat, or cold, you have a greater risk of damage to the area. General instructions Your health care provider may recommend that you see a physical therapist. This person can help you come up with a safe exercise program. If told by your health care provider, do physical therapy exercises to improve movement and strength in the affected area. Keep all follow-up visits. This is important. This includes  any physical therapy visits. Contact a health care provider if: Your pain gets worse. Medicines do not help ease your pain. You cannot use the part of your body that hurts, such as your arm, leg, or neck. You have trouble sleeping. You have trouble doing your normal activities. Get help right away if: You have a new injury and your pain is worse or different. You feel numb or you have tingling in the painful area. Summary Musculoskeletal pain refers to aches and pains in your bones, joints, muscles, and the tissues that surround them. This pain can occur in any part of the body. Your health care provider may recommend that you see a physical therapist. This person can help you come up with a safe exercise program. Do any exercises as told by your physical therapist. Lower your stress level. Stress can worsen musculoskeletal pain. Ways to lower stress may include meditation, yoga, cognitive or behavioral therapy, acupuncture, and massage therapy. This  information is not intended to replace advice given to you by your health care provider. Make sure you discuss any questions you have with your health care provider. Document Revised: 03/21/2020 Document Reviewed: 02/28/2020 Elsevier Patient Education  [place] therapy can help ease sore, stiff, injured, and tight muscles and joints. Heat relaxes your muscles. This may help ease your pain and muscle spasms. What are the risks? If you have any of the following conditions, do not use heat therapy unless your doctor says it is okay. These conditions include: New bruises. Open wounds. Any of these in the area being treated: Healing wounds. Infected skin. Scarred skin. Problems with how blood moves through your body (circulation). Loss of feeling (numbness) in the part of your body that is being treated. Unusual swelling of the part of the body that is being treated. Blood clots. Diabetes. Heart disease. Cancer. Not being able to communicate pain. This may include young children and people who have problems with their brain function (dementia). How to use heat therapy There are different kinds of heat therapy. These include: Moist heat pack. Hot water bottle. Electric heating pad. Heated gel pack. Heated wrap. Warm water bath. Your doctor will tell you how to use heat therapy. In general, you should: Place a towel between your skin and the heat source. Leave the heat on for 20-30 minutes. Your skin may turn pink. Take off the heat if your skin turns bright red. This is very important. If you cannot feel pain, heat, or cold, you have a greater risk of getting burned. Your doctor may also tell you to take a warm water bath. To do this: Put a non-slip pad in the bathtub to prevent a fall. Fill the bathtub with warm water. Check the water temperature. Soak in the water for 15-20 minutes, or as told by your doctor. Be careful when you stand up after the bath.  You may feel dizzy. Pat yourself dry after the bath. Do not rub your skin to dry it. General recommendations for heat therapy Be careful not to burn your skin when using heat therapy. High heat or using heat for a long time can cause burns. Do not sleep while using heat therapy. Only use heat therapy while you are awake. Check your skin during heat therapy. Do not use heat therapy if you have a new injury, especially if you have swelling on the injured area. Do not use heat therapy on areas of your skin that are already irritated, such  as with a rash or sunburn. Do not use heat therapy if your skin turns bright red. Contact a doctor if: You have blisters, redness, swelling, or loss of feeling in the area where you use heat therapy. You have new pain. You have pain that gets worse. Summary Heat therapy is the use of heat to help ease sore, stiff, injured, and tight muscles and joints. There are different types of heat therapy. Your doctor will tell you which one to use. Only use heat therapy while you are awake. Watch your skin to make sure you do not get burned while using heat therapy. This information is not intended to replace advice given to you by your health care provider. Make sure you discuss any questions you have with your health care provider. Document Revised: 09/18/2020 Document Reviewed: 09/18/2020 Elsevier Patient Education  [place].

## 2022-10-28 ENCOUNTER — Ambulatory Visit
Admission: RE | Admit: 2022-10-28 | Discharge: 2022-10-28 | Disposition: A | Discharge status: Home / Self Care | Payer: BC Managed Care – PPO | Source: Ambulatory Visit | Attending: Family Medicine | Admitting: Family Medicine

## 2022-10-28 ENCOUNTER — Other Ambulatory Visit: Payer: Self-pay | Admitting: Family Medicine

## 2022-10-28 DIAGNOSIS — Z1231 Encounter for screening mammogram for malignant neoplasm of breast: Secondary | ICD-10-CM | POA: Insufficient documentation

## 2022-10-28 DIAGNOSIS — N6489 Other specified disorders of breast: Secondary | ICD-10-CM

## 2022-10-28 DIAGNOSIS — R928 Other abnormal and inconclusive findings on diagnostic imaging of breast: Secondary | ICD-10-CM

## 2022-11-02 ENCOUNTER — Ambulatory Visit
Admission: RE | Admit: 2022-11-02 | Discharge: 2022-11-02 | Disposition: A | Discharge status: Home / Self Care | Payer: BC Managed Care – PPO | Source: Ambulatory Visit | Attending: Family Medicine | Admitting: Family Medicine

## 2022-11-02 DIAGNOSIS — N6489 Other specified disorders of breast: Secondary | ICD-10-CM | POA: Diagnosis present

## 2022-11-02 DIAGNOSIS — R928 Other abnormal and inconclusive findings on diagnostic imaging of breast: Secondary | ICD-10-CM

## 2022-12-04 ENCOUNTER — Encounter: Payer: Self-pay | Admitting: Nurse Practitioner

## 2022-12-04 ENCOUNTER — Other Ambulatory Visit: Payer: Self-pay

## 2022-12-04 ENCOUNTER — Ambulatory Visit (INDEPENDENT_AMBULATORY_CARE_PROVIDER_SITE_OTHER): Payer: BC Managed Care – PPO | Admitting: Nurse Practitioner

## 2022-12-04 VITALS — BP 112/72 | HR 86 | Temp 98.3°F | Resp 18 | Ht 62.0 in | Wt 114.7 lb

## 2022-12-04 DIAGNOSIS — Z0000 Encounter for general adult medical examination without abnormal findings: Secondary | ICD-10-CM | POA: Diagnosis not present

## 2022-12-04 DIAGNOSIS — E7800 Pure hypercholesterolemia, unspecified: Secondary | ICD-10-CM | POA: Diagnosis not present

## 2022-12-04 DIAGNOSIS — Z131 Encounter for screening for diabetes mellitus: Secondary | ICD-10-CM | POA: Diagnosis not present

## 2022-12-04 DIAGNOSIS — D649 Anemia, unspecified: Secondary | ICD-10-CM

## 2022-12-04 NOTE — Progress Notes (Signed)
Name: Angela Gordon   MRN: [id number]    DOB: Nov 28, 1971   Date:12/04/2022       Progress Note  Subjective  Chief Complaint  Chief Complaint  Patient presents with   Annual Exam    HPI  Patient presents for annual CPE.  Diet: well balanced diet Exercise: walks a lot, she does instacart  Sleep: 6 hours but wakes up through out the night Last dental exam:July 2023 Last eye exam: over due  [place] Visit from 12/04/2022 in [facility]  AUDIT-C Score 1      Depression: Phq 9 is  negative    12/04/2022    1:09 PM 10/19/2022    9:41 AM 09/23/2022    1:10 PM 06/01/2022   12:01 PM 04/28/2022   10:21 AM  Depression screen PHQ 2/9  Decreased Interest 0 0 0 0 0  Down, Depressed, Hopeless 0 0 0 0 0  PHQ - 2 Score 0 0 0 0 0  Altered sleeping  0 0    Tired, decreased energy  0 0    Change in appetite  0 0    Feeling bad or failure about yourself   0 0    Trouble concentrating  0 0    Moving slowly or fidgety/restless  0 0    Suicidal thoughts  0 0    PHQ-9 Score  0 0    Difficult doing work/chores  Not difficult at all Not difficult at all     Hypertension: BP Readings from Last 3 Encounters:  12/04/22 112/72  10/19/22 136/80  06/01/22 118/72   Obesity: Wt Readings from Last 3 Encounters:  12/04/22 114 lb 11.2 oz (52 kg)  10/19/22 111 lb 6.4 oz (50.5 kg)  06/01/22 116 lb 6.4 oz (52.8 kg)   BMI Readings from Last 3 Encounters:  12/04/22 20.98 kg/m  10/19/22 20.38 kg/m  06/01/22 21.29 kg/m     Vaccines:  HPV: up to at age 94 , ask insurance if age between 59-45  Shingrix: 67-64 yo and ask insurance if covered when patient above 54 yo Pneumonia:  educated and discussed with patient. Flu:  educated and discussed with patient.  Hep C Screening: 11/27/2020 STD testing and prevention (HIV/chl/gon/syphilis): 10/19/2022 Intimate partner violence:none Sexual History : yes Menstrual History/LMP/Abnormal Bleeding:  hysterectomy Incontinence Symptoms: none  Breast cancer:  - Last Mammogram: 10/28/2022 - BRCA gene screening: none  Osteoporosis: Discussed high calcium and vitamin D supplementation, weight bearing exercises  Cervical cancer screening: hysterectomy  Skin cancer: Discussed monitoring for atypical lesions  Colorectal cancer: 04/09/2021   Lung cancer:   Low Dose CT Chest recommended if Age 26-80 years, 20 pack-year currently smoking OR have quit w/in 15years. Patient does not qualify.   ECG: none  Advanced Care Planning: A voluntary discussion about advance care planning including the explanation and discussion of advance directives.  Discussed health care proxy and Living will, and the patient was able to identify a health care proxy as husband.  Patient does not have a living will at present time. If patient does have living will, I have requested they bring this to the clinic to be scanned in to their chart.  Lipids: Lab Results  Component Value Date   CHOL 179 12/03/2021   CHOL 175 11/27/2020   CHOL 197 11/03/2019   Lab Results  Component Value Date   HDL 68 12/03/2021   HDL 65 11/27/2020   HDL 74 11/03/2019   Lab  Results  Component Value Date   LDLCALC 86 12/03/2021   LDLCALC 92 11/27/2020   LDLCALC 108 (H) 11/03/2019   Lab Results  Component Value Date   TRIG 149 12/03/2021   TRIG 86 11/27/2020   TRIG 68 11/03/2019   Lab Results  Component Value Date   CHOLHDL 2.6 12/03/2021   CHOLHDL 2.7 11/27/2020   CHOLHDL 2.7 11/03/2019   No results found for: "LDLDIRECT"  Glucose: Glucose, Bld  Date Value Ref Range Status  12/03/2021 77 65 - 99 mg/dL Final    Comment:    .            Fasting reference interval .   11/27/2020 93 65 - 99 mg/dL Final    Comment:    .            Fasting reference interval .   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    Past Surgical History:  Procedure Laterality Date   ABDOMINAL HYSTERECTOMY  2010   total -Menopausal symptoms, takes HRT   BIOPSY  02/26/2020   Procedure: BIOPSY;  Surgeon: Irving Copas., MD;  Location: [place];  Service: Gastroenterology;;   BIOPSY  08/19/2020   Procedure: BIOPSY;  Surgeon: Irving Copas., MD;  Location: [facility] ENDOSCOPY;  Service: Gastroenterology;;   BREAST EXCISIONAL BIOPSY Left 2003   neg bx dr Jamal Collin   BREAST SURGERY Left 2004   biopsy, benign   COLONOSCOPY N/A 08/19/2020   Procedure: COLONOSCOPY;  Surgeon: Irving Copas., MD;  Location: [place];  Service: Gastroenterology;  Laterality: N/A;   COLONOSCOPY W/ BIOPSIES  05/09/2020   [facility] - removed a "Cap Polyp" per patient   COLONOSCOPY WITH PROPOFOL N/A 11/29/2019   Procedure: COLONOSCOPY WITH PROPOFOL;  Surgeon: Virgel Manifold, MD;  Location: [facility] ENDOSCOPY;  Service: Endoscopy;  Laterality: N/A;   COLONOSCOPY WITH PROPOFOL N/A 02/26/2020   Procedure: COLONOSCOPY WITH PROPOFOL;  Surgeon: Rush Landmark Telford Nab., MD;  Location: [place];  Service: Gastroenterology;  Laterality: N/A;   CYST REMOVAL HAND Right 2005-06   ESOPHAGOGASTRODUODENOSCOPY (EGD) WITH PROPOFOL N/A 08/19/2020   Procedure: ESOPHAGOGASTRODUODENOSCOPY (EGD) WITH PROPOFOL;  Surgeon: Rush Landmark Telford Nab., MD;  Location: [place];  Service: Gastroenterology;  Laterality: N/A;   EUS  02/26/2020   Procedure: LOWER ENDOSCOPIC ULTRASOUND (EUS);  Surgeon: Irving Copas., MD;  Location: [place];  Service: Gastroenterology;;   Otho Darner SIGMOIDOSCOPY  05/17/20, 05/19/20   x 2  at [place] Right 10/16/2020   Procedure: DOUBLE OSTEOTOMY RIGHT;  Surgeon: [name], DPM;  Location: [place];  Service: Podiatry;  Laterality: Right;   HERNIA REPAIR  [id number]   umbilical   TONSILLECTOMY  1978    Family History  Problem Relation Age of Onset   Hypertension Mother    Osteoporosis Mother    Cervical cancer Mother        Hysterectomy in 24s.   Stroke Mother    Crohn's disease Mother    Irritable bowel syndrome Mother    Sickle cell trait Father  Sickle cell trait Sister    Heart disease Maternal Aunt    Breast cancer Maternal Aunt        2 mat aunts   Heart disease Maternal Uncle    Hypertension Maternal Uncle    Ovarian cancer Paternal Aunt 40   Breast cancer Paternal Aunt        pat great aunt   Heart disease Maternal Grandmother    Hypertension Maternal Grandmother    Osteoporosis Maternal Grandmother    Rheum arthritis Maternal Grandfather    Hypertension Maternal Grandfather    Prostate cancer Maternal Grandfather    Leukemia Cousin    Colon cancer Neg Hx    Esophageal cancer Neg Hx    Pancreatic cancer Neg Hx    Stomach cancer Neg Hx    Inflammatory bowel disease Neg Hx    Liver disease Neg Hx    Rectal cancer Neg Hx    Colon polyps Neg Hx     Social History   Socioeconomic History   Marital status: Married    Spouse name: julius   Number of children: 3   Years of education: Not on file   Highest education level: Not on file  Occupational History   Not on file  Tobacco Use   Smoking status: Never   Smokeless tobacco: Never  Vaping Use   Vaping Use: Never used  Substance and Sexual Activity   Alcohol use: Yes    Alcohol/week: 1.0 standard drink of alcohol    Types: 1 Standard drinks or equivalent per week    Comment: rarely   Drug use: No   Sexual activity: Yes    Birth control/protection: Surgical    Comment: Hysterectomy  Other Topics Concern   Not on file  Social History Narrative    Not on file   Social Determinants of Health   Financial Resource Strain: Low Risk  (12/04/2022)   Overall Financial Resource Strain (CARDIA)    Difficulty of Paying Living Expenses: Not hard at all  Food Insecurity: No Food Insecurity (12/04/2022)   Hunger Vital Sign    Worried About Running Out of Food in the Last Year: Never true    Ran Out of Food in the Last Year: Never true  Transportation Needs: No Transportation Needs (12/03/2021)   PRAPARE - Hydrologist (Medical): No    Lack of Transportation (Non-Medical): No  Physical Activity: Insufficiently Active (12/03/2021)   Exercise Vital Sign    Days of Exercise per Week: 1 day    Minutes of Exercise per Session: 10 min  Stress: No Stress Concern Present (12/04/2022)   [place]    Feeling of Stress : Only a little  Social Connections: Socially Integrated (12/04/2022)   Social Connection and Isolation Panel [NHANES]    Frequency of Communication with Friends and Family: More than three times a week    Frequency of Social Gatherings with Friends and Family: More than three times a week    Attends Religious Services: 1 to 4 times per year    Active Member of [employer] or Organizations: No    Attends Music therapist: More than 4 times per year    Marital Status: Married  Human resources officer Violence: Not At Risk (12/04/2022)   Humiliation, Afraid, Rape, and Kick questionnaire    Fear of Current or Ex-Partner: No    Emotionally Abused: No    Physically Abused: No  Sexually Abused: No     Current Outpatient Medications:    acetaminophen (TYLENOL) 325 MG tablet, Take 325-650 mg by mouth every 6 (six) hours as needed for moderate pain., Disp: , Rfl:    albuterol (VENTOLIN HFA) 108 (90 Base) MCG/ACT inhaler, Inhale 2 puffs into the lungs every 6 (six) hours as needed for wheezing or shortness of breath., Disp: 18 g, Rfl: 2   Baclofen 5 MG  TABS, Take 5 mg by mouth 3 (three) times daily as needed (msk pain or spasms)., Disp: 30 tablet, Rfl: 2   calcium carbonate (OSCAL) 1500 (600 Ca) MG TABS tablet, Take 600 mg of elemental calcium by mouth 2 (two) times daily., Disp: , Rfl:    Cholecalciferol (VITAMIN D3) 125 MCG (5000 UT) TABS, Take 5,000 Units by mouth every Monday. , Disp: , Rfl:    [employer] CALAMINE PLUS 1-8 % LOTN, APPLY 1 APPLICATION TOPICALLY IN THE MORNING, AT NOON, IN THE EVENING, AND AT BEDTIME., Disp: 118 mL, Rfl: 1   cyanocobalamin 2000 MCG tablet, Take 2,000 mcg by mouth daily., Disp: , Rfl:    cyclobenzaprine (FLEXERIL) 10 MG tablet, Take 10 mg by mouth 3 (three) times daily as needed for muscle spasms., Disp: , Rfl:    dicyclomine (BENTYL) 10 MG capsule, Take 1 capsule (10 mg total) by mouth 2 (two) times daily as needed (abdominal cramps)., Disp: 30 capsule, Rfl: 1   diphenhydrAMINE (BENADRYL) 25 MG tablet, Take 25 mg by mouth daily as needed for allergies., Disp: , Rfl:    gabapentin (NEURONTIN) 300 MG capsule, Take 1 capsule (300 mg total) by mouth 3 (three) times daily., Disp: 30 capsule, Rfl: 0   hydrocortisone (ANUSOL-HC) 25 MG suppository, Place 1 suppository (25 mg total) rectally every 12 (twelve) hours., Disp: 12 suppository, Rfl: 1   hydrocortisone 2.5 % cream, Apply topically 2 (two) times daily., Disp: , Rfl:    levocetirizine (XYZAL) 5 MG tablet, TAKE 1 TABLET BY MOUTH EVERY DAY IN THE EVENING, Disp: 30 tablet, Rfl: 1   meclizine (ANTIVERT) 25 MG tablet, Take 0.5-1 tablets (12.5-25 mg total) by mouth 3 (three) times daily as needed for dizziness., Disp: 30 tablet, Rfl: 0   ondansetron (ZOFRAN) 4 MG tablet, Take 1-1.5 tablets (4-6 mg total) by mouth every 8 (eight) hours as needed for nausea or vomiting., Disp: 20 tablet, Rfl: 1   polyethylene glycol powder (GLYCOLAX/MIRALAX) 17 GM/SCOOP powder, Take 17 g by mouth 2 (two) times daily as needed., Disp: 3350 g, Rfl: 0   promethazine (PHENERGAN) 25 MG tablet, Take  0.5-1 tablets (12.5-25 mg total) by mouth every 6 (six) hours as needed for refractory nausea / vomiting (or with headaches)., Disp: 20 tablet, Rfl: 1   omeprazole (PRILOSEC) 20 MG capsule, Take 1 capsule (20 mg total) by mouth daily for 30 days, THEN 1 capsule (20 mg total) daily. (Patient taking differently: Take 1 capsule (20 mg total) by mouth daily for 30 days, THEN 1 capsule (20 mg total) daily. Uses as needed), Disp: 90 capsule, Rfl: 3  Current Facility-Administered Medications:    0.9 %  sodium chloride infusion, 500 mL, Intravenous, Once, Mansouraty, Telford Nab., MD  Allergies  Allergen Reactions   Erythromycin Base Other (See Comments)    Several family members have severe side effects, pt has never taken but should avoid    Penicillins Diarrhea and Rash    Did it involve swelling of the face/tongue/throat, SOB, or low BP? Unknown  Did it involve sudden or  severe rash/hives, skin peeling, or any reaction on the inside of your mouth or nose? Unknown  Did you need to seek medical attention at a hospital or doctor's office? Unknown  When did it last happen?    Childhood    If all above answers are "NO", may proceed with cephalosporin use.   [place] [Shellfish Allergy] Anaphylaxis     ROS  Constitutional: Negative for fever or weight change.  Respiratory: Negative for cough and shortness of breath.   Cardiovascular: Negative for chest pain or palpitations.  Gastrointestinal: Negative for abdominal pain, no bowel changes.  Musculoskeletal: Negative for gait problem or joint swelling.  Skin: Negative for rash.  Neurological: Negative for dizziness or headache.  No other specific complaints in a complete review of systems (except as listed in HPI above).   Objective  Vitals:   12/04/22 1305  BP: 112/72  Pulse: 86  Resp: 18  Temp: 98.3 F (36.8 C)  TempSrc: Oral  SpO2: 97%  Weight: 114 lb 11.2 oz (52 kg)  Height: '5\' 2"'$  (1.575 m)    Body mass index is 20.98  kg/m.  Physical Exam Constitutional: Patient appears well-developed and well-nourished. No distress.  HENT: Head: Normocephalic and atraumatic. Ears: B TMs ok, no erythema or effusion; Nose: Nose normal. Mouth/Throat: Oropharynx is clear and moist. No oropharyngeal exudate.  Eyes: Conjunctivae and EOM are normal. Pupils are equal, round, and reactive to light. No scleral icterus.  Neck: Normal range of motion. Neck supple. No JVD present. No thyromegaly present.  Cardiovascular: Normal rate, regular rhythm and normal heart sounds.  No murmur heard. No BLE edema. Pulmonary/Chest: Effort normal and breath sounds normal. No respiratory distress. Abdominal: Soft. Bowel sounds are normal, no distension. There is no tenderness. no masses Musculoskeletal: Normal range of motion, no joint effusions. No gross deformities Neurological: he is alert and oriented to person, place, and time. No cranial nerve deficit. Coordination, balance, strength, speech and gait are normal.  Skin: Skin is warm and dry. No rash noted. No erythema.  Psychiatric: Patient has a normal mood and affect. behavior is normal. Judgment and thought content normal.   No results found for this or any previous visit (from the past 2160 hour(s)).   Fall Risk:    12/04/2022    1:09 PM 10/19/2022    9:41 AM 09/23/2022    1:09 PM 06/01/2022   12:01 PM 04/28/2022   10:21 AM  Fall Risk   Falls in the past year? 0 0 0 0 0  Number falls in past yr: 0 0 0 0 0  Injury with Fall? 0 0 0 0 0  Risk for fall due to :  No Fall Risks No Fall Risks  No Fall Risks  Follow up Falls evaluation completed Falls prevention discussed;Education provided;Falls evaluation completed Falls prevention discussed;Education provided;Falls evaluation completed Falls evaluation completed Falls prevention discussed     Functional Status Survey: Is the patient deaf or have difficulty hearing?: No Does the patient have difficulty seeing, even when wearing  glasses/contacts?: No Does the patient have difficulty concentrating, remembering, or making decisions?: No Does the patient have difficulty walking or climbing stairs?: No Does the patient have difficulty dressing or bathing?: No Does the patient have difficulty doing errands alone such as visiting a doctor's office or shopping?: No   Assessment & Plan  1. Annual physical exam  - CBC with Differential/Platelet - COMPLETE METABOLIC PANEL WITH GFR - Lipid panel - Hemoglobin A1c  2. Anemia, unspecified  type  - CBC with Differential/Platelet  3. Pure hypercholesterolemia  - Lipid panel  4. Screening for diabetes mellitus  - COMPLETE METABOLIC PANEL WITH GFR - Hemoglobin A1c   -USPSTF grade A and B recommendations reviewed with patient; age-appropriate recommendations, preventive care, screening tests, etc discussed and encouraged; healthy living encouraged; see AVS for patient education given to patient -Discussed importance of 150 minutes of physical activity weekly, eat two servings of fish weekly, eat one serving of tree nuts ( cashews, pistachios, pecans, almonds.Marland Kitchen) every other day, eat 6 servings of fruit/vegetables daily and drink plenty of water and avoid sweet beverages.   -Reviewed Health Maintenance: due for immunizations, labs and dexa scan

## 2022-12-05 LAB — COMPLETE METABOLIC PANEL WITH GFR
AG Ratio: 1.6 (calc) (ref 1.0–2.5)
ALT: 16 U/L (ref 6–29)
AST: 19 U/L (ref 10–35)
Albumin: 4.2 g/dL (ref 3.6–5.1)
Alkaline phosphatase (APISO): 52 U/L (ref 37–153)
BUN: 13 mg/dL (ref 7–25)
CO2: 24 mmol/L (ref 20–32)
Calcium: 9 mg/dL (ref 8.6–10.4)
Chloride: 106 mmol/L (ref 98–110)
Creat: 0.94 mg/dL (ref 0.50–1.03)
Globulin: 2.7 g/dL (calc) (ref 1.9–3.7)
Glucose, Bld: 87 mg/dL (ref 65–99)
Potassium: 3.8 mmol/L (ref 3.5–5.3)
Sodium: 140 mmol/L (ref 135–146)
Total Bilirubin: 1.1 mg/dL (ref 0.2–1.2)
Total Protein: 6.9 g/dL (ref 6.1–8.1)
eGFR: 74 mL/min/{1.73_m2} (ref >=60)

## 2022-12-05 LAB — CBC WITH DIFFERENTIAL/PLATELET
Absolute Monocytes: 506 cells/uL (ref 200–950)
Basophils Absolute: 70 cells/uL (ref 0–200)
Basophils Relative: 1.1 %
Eosinophils Absolute: 179 cells/uL (ref 15–500)
Eosinophils Relative: 2.8 %
HCT: 38.4 % (ref 35.0–45.0)
Hemoglobin: 13.4 g/dL (ref 11.7–15.5)
Lymphs Abs: 1901 cells/uL (ref 850–3900)
MCH: 32.1 pg (ref 27.0–33.0)
MCHC: 34.9 g/dL (ref 32.0–36.0)
MCV: 92.1 fL (ref 80.0–100.0)
MPV: 10.2 fL (ref 7.5–12.5)
Monocytes Relative: 7.9 %
Neutro Abs: 3744 cells/uL (ref 1500–7800)
Neutrophils Relative %: 58.5 %
Platelets: 268 10*3/uL (ref 140–400)
RBC: 4.17 10*6/uL (ref 3.80–5.10)
RDW: 12 % (ref 11.0–15.0)
Total Lymphocyte: 29.7 %
WBC: 6.4 10*3/uL (ref 3.8–10.8)

## 2022-12-05 LAB — LIPID PANEL
Cholesterol: 198 mg/dL (ref <200)
HDL: 68 mg/dL (ref >=50)
LDL Cholesterol (Calc): 114 mg/dL (calc) — ABNORMAL HIGH
Non-HDL Cholesterol (Calc): 130 mg/dL (calc) — ABNORMAL HIGH (ref <130)
Total CHOL/HDL Ratio: 2.9 (calc) (ref <5.0)
Triglycerides: 67 mg/dL (ref <150)

## 2022-12-05 LAB — HEMOGLOBIN A1C
Hgb A1c MFr Bld: 5.4 % of total Hgb (ref <5.7)
Mean Plasma Glucose: 108 mg/dL
eAG (mmol/L): 6 mmol/L

## 2022-12-16 NOTE — Progress Notes (Signed)
Name: Angela Gordon   MRN: [id number]    DOB: August 20, 1971   Date:12/17/2022       Progress Note  Subjective  Chief Complaint  Chief Complaint  Patient presents with   Headache     Follow up, baclofen do not work well    I connected with  [name]  on 12/17/22 at 10:00 AM EST by a video enabled telemedicine application and verified that I am speaking with the correct person using two identifiers.  I discussed the limitations of evaluation and management by telemedicine and the availability of in person appointments. The patient expressed understanding and agreed to proceed with a virtual visit  Staff also discussed with the patient that there may be a patient responsible charge related to this service. Patient Location: home Provider Location: [facility] Additional Individuals present: alone  HPI  Headaches: patient was seen by Delsa Grana PA for headaches on 09/23/2022 and 10/19/2022. She has had frequent headaches for the past 2 years.  Typically located in the occipital region. She describes the pain as throbbing.  Associated symptoms include muscle aches and nausea. She has previously tried tylenol, muscle relaxer's. Patient does have a history of migraines. Patient says this pain is different than her migraines and is typically associated with neck pain. She says the pain wraps around her head. She was given a prescription for baclofen and patient reports that this did not help. Plan if the baclofen did not help. Will send in tizanidine to try.  She says the phenergan she was given does help but it knocks her out.  Discussed the plan that Kristeen Miss had for her about seeing neurology and physical therapy. She agrees and referrals were placed.   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    Social History   Tobacco Use   Smoking status: Never   Smokeless tobacco: Never  Substance Use Topics   Alcohol use: Yes    Alcohol/week: 1.0 standard drink of alcohol    Types: 1 Standard drinks or equivalent per week    Comment: rarely     Current Outpatient Medications:    acetaminophen (TYLENOL) 325 MG tablet, Take 325-650 mg by mouth every 6 (six) hours as needed for moderate pain., Disp: , Rfl:    albuterol (VENTOLIN HFA) 108 (90 Base) MCG/ACT inhaler, Inhale 2 puffs into the lungs every 6 (six) hours as needed for wheezing or shortness of breath., Disp: 18 g, Rfl: 2   calcium carbonate (OSCAL) 1500 (600 Ca) MG TABS tablet, Take 600 mg of elemental calcium by mouth 2 (two) times daily., Disp: , Rfl:    Cholecalciferol (VITAMIN D3) 125 MCG (5000 UT) TABS, Take 5,000 Units by mouth every Monday. , Disp: , Rfl:    [employer] CALAMINE PLUS 1-8 % LOTN, APPLY 1 APPLICATION TOPICALLY IN THE MORNING, AT NOON, IN THE EVENING, AND AT BEDTIME., Disp: 118 mL, Rfl: 1   cyanocobalamin 2000 MCG tablet, Take 2,000 mcg by mouth daily., Disp: , Rfl:    dicyclomine (BENTYL) 10 MG capsule, Take 1 capsule (10 mg total) by mouth 2 (two) times daily as needed (  abdominal cramps)., Disp: 30 capsule, Rfl: 1   diphenhydrAMINE (BENADRYL) 25 MG tablet, Take 25 mg by mouth daily as needed for allergies., Disp: , Rfl:    gabapentin (NEURONTIN) 300 MG capsule, Take 1 capsule (300 mg total) by mouth 3 (three) times daily., Disp: 30 capsule, Rfl: 0   hydrocortisone (ANUSOL-HC) 25 MG suppository, Place 1 suppository (25 mg total) rectally every 12 (twelve) hours., Disp: 12 suppository, Rfl: 1   hydrocortisone 2.5 % cream, Apply topically 2 (two) times daily., Disp: , Rfl:    levocetirizine (XYZAL) 5 MG tablet, TAKE 1  TABLET BY MOUTH EVERY DAY IN THE EVENING, Disp: 30 tablet, Rfl: 1   meclizine (ANTIVERT) 25 MG tablet, Take 0.5-1 tablets (12.5-25 mg total) by mouth 3 (three) times daily as needed for dizziness., Disp: 30 tablet, Rfl: 0   ondansetron (ZOFRAN) 4 MG tablet, Take 1-1.5 tablets (4-6 mg total) by mouth every 8 (eight) hours as needed for nausea or vomiting., Disp: 20 tablet, Rfl: 1   polyethylene glycol powder (GLYCOLAX/MIRALAX) 17 GM/SCOOP powder, Take 17 g by mouth 2 (two) times daily as needed., Disp: 3350 g, Rfl: 0   promethazine (PHENERGAN) 25 MG tablet, Take 0.5-1 tablets (12.5-25 mg total) by mouth every 6 (six) hours as needed for refractory nausea / vomiting (or with headaches)., Disp: 20 tablet, Rfl: 1   omeprazole (PRILOSEC) 20 MG capsule, Take 1 capsule (20 mg total) by mouth daily for 30 days, THEN 1 capsule (20 mg total) daily. (Patient taking differently: Take 1 capsule (20 mg total) by mouth daily for 30 days, THEN 1 capsule (20 mg total) daily. Uses as needed), Disp: 90 capsule, Rfl: 3   tiZANidine (ZANAFLEX) 4 MG tablet, Take 0.5-1.5 tablets (2-6 mg total) by mouth every 8 (eight) hours as needed for muscle spasms (muscle tightness)., Disp: 90 tablet, Rfl: 0  Current Facility-Administered Medications:    0.9 %  sodium chloride infusion, 500 mL, Intravenous, Once, Mansouraty, Telford Nab., MD  Allergies  Allergen Reactions   Erythromycin Base Other (See Comments)    Several family members have severe side effects, pt has never taken but should avoid    Penicillins Diarrhea and Rash    Did it involve swelling of the face/tongue/throat, SOB, or low BP? Unknown  Did it involve sudden or severe rash/hives, skin peeling, or any reaction on the inside of your mouth or nose? Unknown  Did you need to seek medical attention at a hospital or doctor's office? Unknown  When did it last happen?    Childhood    If all above answers are "NO", may proceed with cephalosporin use.   Scallops  [Shellfish Allergy] Anaphylaxis    I personally reviewed active problem list, medication list, allergies, notes from last encounter with the patient/caregiver today.  ROS  Constitutional: Negative for fever or weight change.  Respiratory: Negative for cough and shortness of breath.   Cardiovascular: Negative for chest pain or palpitations.  Gastrointestinal: Negative for abdominal pain, no bowel changes.  Musculoskeletal: Negative for gait problem or joint swelling.  Skin: Negative for rash.  Neurological: Negative for dizziness, positive for headache.  No other specific complaints in a complete review of systems (except as listed in HPI above).   Objective  Virtual encounter, vitals not obtained.  There is no height or weight on file to calculate BMI.  Nursing Note and Vital Signs reviewed.  Physical Exam  Awake, alert and oriented, speaking in complete sentences  No results found for this  or any previous visit (from the past 72 hour(s)).  Assessment & Plan  Problem List Items Addressed This Visit   None Visit Diagnoses     Nonintractable episodic headache, unspecified headache type    -  Primary   continued headaches, will send in prescription for tizandidine , baclofen did not help.  placed referral for PT and neurology   Relevant Medications   tiZANidine (ZANAFLEX) 4 MG tablet   Other Relevant Orders   Ambulatory referral to Physical Therapy   Ambulatory referral to Neurology   Neck pain, musculoskeletal       continued headaches, will send in prescription for tizandidine , baclofen did not help. placed referral for PT and neurology        -Red flags and when to present for emergency care or RTC including fever >101.15F, chest pain, shortness of breath, new/worsening/un-resolving symptoms,  reviewed with patient at time of visit. Follow up and care instructions discussed and provided in AVS. - I discussed the assessment and treatment plan with the patient. The  patient was provided an opportunity to ask questions and all were answered. The patient agreed with the plan and demonstrated an understanding of the instructions.  I provided 15 minutes of non-face-to-face time during this encounter.  Bo Merino, [name]

## 2022-12-17 ENCOUNTER — Other Ambulatory Visit: Payer: Self-pay

## 2022-12-17 ENCOUNTER — Telehealth (INDEPENDENT_AMBULATORY_CARE_PROVIDER_SITE_OTHER): Payer: BC Managed Care – PPO | Admitting: Nurse Practitioner

## 2022-12-17 ENCOUNTER — Encounter: Payer: Self-pay | Admitting: Nurse Practitioner

## 2022-12-17 DIAGNOSIS — M542 Cervicalgia: Secondary | ICD-10-CM

## 2022-12-17 DIAGNOSIS — R519 Headache, unspecified: Secondary | ICD-10-CM

## 2022-12-17 MED ORDER — TIZANIDINE HCL 4 MG PO TABS: 2.0000 mg | ORAL_TABLET | Freq: Three times a day (TID) | ORAL | 0 refills | Status: DC | PRN

## 2022-12-18 ENCOUNTER — Telehealth: Payer: BC Managed Care – PPO | Admitting: Family Medicine

## 2023-01-25 ENCOUNTER — Encounter: Payer: Self-pay | Admitting: Physical Therapy

## 2023-01-25 ENCOUNTER — Ambulatory Visit: Payer: BC Managed Care – PPO | Attending: Nurse Practitioner | Admitting: Physical Therapy

## 2023-01-25 DIAGNOSIS — R519 Headache, unspecified: Secondary | ICD-10-CM | POA: Insufficient documentation

## 2023-01-25 DIAGNOSIS — M542 Cervicalgia: Secondary | ICD-10-CM

## 2023-01-25 NOTE — Therapy (Signed)
OUTPATIENT PHYSICAL THERAPY CERVICAL EVALUATION   Patient Name: Angela Gordon MRN: [id number] DOB:04-11-1971, 52 y.o., female Today's Date: 01/26/2023  END OF SESSION:  PT End of Session - 01/25/23 1105     Visit Number 1    Number of Visits 17    Date for PT Re-Evaluation 02/22/23    PT Start Time 0915    PT Stop Time 1000    PT Time Calculation (min) 45 min    Activity Tolerance Patient tolerated treatment well    Behavior During Therapy [facility] for tasks assessed/performed             Past Medical History:  Diagnosis Date   Anemia    Asthma    Uses rescue inhaler occasionally.   Complication of anesthesia    slow to wake up   Family history of adverse reaction to anesthesia    Sister- Cardiac arrest during emergency c-section   Family history of ovarian cancer 07/26/2017   Headache    occasional - otc med prn   Heart murmur    was told she had MVP when she was around 24 years, she states she never followed up with that and has never had any issues from it   History of blood transfusion 1992   Blood Loss from Toxemia of Pregnancy   Hypoglycemia    Osteoporosis    Strong FHx of Osteoporosis.   Osteoporosis 10/06/2019   PONV (postoperative nausea and vomiting)    Rectal polyp 01/11/2020   Scoliosis    Seen Dr. Jefm Bryant   Shingles    Sickle-cell trait [facility])    Stroke [facility]) 1992   Toxemia of Pregnancy    Past Surgical History:  Procedure Laterality Date   ABDOMINAL HYSTERECTOMY  2010   total -Menopausal symptoms, takes HRT   BIOPSY  02/26/2020   Procedure: BIOPSY;  Surgeon: Irving Copas., MD;  Location: [place];  Service: Gastroenterology;;   BIOPSY  08/19/2020   Procedure: BIOPSY;  Surgeon: Irving Copas., MD;  Location: [facility] ENDOSCOPY;  Service: Gastroenterology;;   BREAST EXCISIONAL BIOPSY Left 2003   neg bx dr Jamal Collin   BREAST SURGERY Left 2004   biopsy, benign   COLONOSCOPY N/A 08/19/2020   Procedure: COLONOSCOPY;  Surgeon:  Irving Copas., MD;  Location: [place];  Service: Gastroenterology;  Laterality: N/A;   COLONOSCOPY W/ BIOPSIES  05/09/2020   [facility] - removed a "Cap Polyp" per patient   COLONOSCOPY WITH PROPOFOL N/A 11/29/2019   Procedure: COLONOSCOPY WITH PROPOFOL;  Surgeon: Virgel Manifold, MD;  Location: [facility] ENDOSCOPY;  Service: Endoscopy;  Laterality: N/A;   COLONOSCOPY WITH PROPOFOL N/A 02/26/2020   Procedure: COLONOSCOPY WITH PROPOFOL;  Surgeon: Rush Landmark Telford Nab., MD;  Location: [place];  Service: Gastroenterology;  Laterality: N/A;   CYST REMOVAL HAND Right 2005-06   ESOPHAGOGASTRODUODENOSCOPY (EGD) WITH PROPOFOL N/A 08/19/2020   Procedure: ESOPHAGOGASTRODUODENOSCOPY (EGD) WITH PROPOFOL;  Surgeon: Rush Landmark Telford Nab., MD;  Location: [place];  Service: Gastroenterology;  Laterality: N/A;   EUS  02/26/2020   Procedure: LOWER ENDOSCOPIC ULTRASOUND (EUS);  Surgeon: Irving Copas., MD;  Location: [place];  Service: Gastroenterology;;   Otho Darner SIGMOIDOSCOPY  05/17/20, 05/19/20   x 2 at [place] Right 10/16/2020   Procedure: DOUBLE OSTEOTOMY RIGHT;  Surgeon: [name], DPM;  Location: [place];  Service: Podiatry;  Laterality: Right;   HERNIA REPAIR  [id number]   umbilical   TONSILLECTOMY  1978  Patient Active Problem List   Diagnosis Date Noted   Pure hypercholesterolemia 11/27/2020   Cap polyposis ([place]) 07/27/2020   Unintentional weight loss 07/27/2020   Intestinal malabsorption 05/31/2020   Hypocalcemia 05/31/2020   Anemia 05/31/2020   B12 deficiency 05/31/2020   IBD (inflammatory bowel disease) 05/31/2020   Vitamin D deficiency 05/07/2016   Sickle-cell trait ([place]) 05/07/2016   Osteoporosis, 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 female who presents with chronic bilat cervicalgia that started in October of 2023. 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 Since onset the pt has experienced HAs that feel "different than any migraine she's had before". Pt reports her HA pain originates in the suboccipital region and stops in the upper thoracic region. Pt experiences intermittent cervical mechanical sx with rotation and lateral flexion occasionally. Frequency/intensity of HA pain is ISQ. No distal sx. No N/T. Imaging is not available at this time. Pt describes pain as an ongoing throb. Worst pain is 10/10 NPRS. Best pain is 2/10 NPRS. Currently pain is 5/10 NPRS. Pt reports finding relief with muscle relaxants, OTC tension HA medication, heat packs, rest, and her seat massager. Pt does not experience light sensitivity with HAs. ON/OFF: HA pain comes on quickly and does not subside for 1-2 days; must lye down and cannot do much for at least a day. Pt normally sleeps on her side or back and it takes about 1 hr to get back to sleep after taking her pain medications. Pt works as a Scientist, water quality at [employer], and works at her computer for most of the day. PMH: osteoporosis, scoliosis, total hysterectomy, and BPPV. Pt denies N/V, B&B changes, unexplained weight fluctuation, saddle paresthesia, fever, night sweats, or unrelenting night pain at this time.   PAIN:  Are you having pain? Yes: NPRS scale: C - 5/10; W - 10/10; B - 2/10 Pain location: Suboccipital region to upper thoracic region Pain description: Ongoing  throb Aggravating factors: Stress Relieving factors:  Heat, rest, seat massager, muscle relaxer, OTC tension HA medication  PRECAUTIONS: None  WEIGHT BEARING RESTRICTIONS: No  FALLS:  Has patient fallen in last 6 months? No  LIVING ENVIRONMENT: Lives with: lives with their family; 42 y.o. son, 8 y.o. son, and 35 y.o. 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  PATIENT SURVEYS:  FOTO - deferred to next session  COGNITION: Overall cognitive status: Within functional limits for tasks assessed  SENSATION: WFL  POSTURE: rounded shoulders, forward head, and flexed trunk   PALPATION: Tenderness in suboccipital muscles and UT/LS  CERVICAL ROM:   Active ROM A/PROM (deg) eval  Flexion 45  Extension 35  Right lateral flexion 45  Left lateral flexion 35  Right rotation 50  Left rotation 60   (Blank rows = not tested)  Passive ROM A/PROM (deg) eval  Flexion WNL; concordant pain  Extension WNL  Right lateral flexion WNL; concordant pain  Left lateral flexion WNL  Right rotation WNL  Left rotation WNL   (Blank rows = not tested)  CPAs/UPAs:  - C2-C3 concordant pain; not better with repeated motion - T1-T5 concordant pain; not better with repeated motion  UPAs: - C2-C3 unremarkable - T1-T5 concordant pain on RUE   MT: - Sub occipital release: relieved concordant pain - Cervical traction: relieved concordant pain   UPPER EXTREMITY ROM:  Active ROM Right eval Left eval  Shoulder flexion WNL WNL  Shoulder extension WNL WNL  Shoulder abduction WNL WNL  Shoulder adduction    Shoulder extension    Shoulder internal rotation Concordant t-spine pain  Concordant t-spine pain  Shoulder external rotation WNL WNL  Elbow flexion    Elbow extension    Wrist flexion    Wrist extension    Wrist ulnar deviation    Wrist radial deviation    Wrist pronation    Wrist supination     (Blank rows = not tested)  Functional shoulder ROM: WFL  UPPER EXTREMITY MMT:  MMT Right eval Left eval  Shoulder flexion 4+ 4+  Shoulder extension    Shoulder abduction 4+ 4+  Shoulder adduction    Shoulder extension    Shoulder internal rotation    Shoulder external rotation    Middle trapezius 3+ 3+  Lower trapezius 3+ 3+  Elbow flexion    Elbow extension    Wrist flexion    Wrist extension    Wrist ulnar deviation    Wrist radial deviation    Wrist pronation    Wrist supination    Grip strength     (Blank rows = not tested)  CERVICAL SPECIAL TESTS:  Neck flexor muscle endurance test: Positive; 20 secs Flexion-rotation test: Positive for cervicogenic headaches  FUNCTIONAL TESTS:  Not indicated  TODAY'S TREATMENT:  DATE: 01/25/23 Therex: - Cervical retraction mobilization with towel with 3-5 sec holds x 12 (2x/day) - Cervical extension mobilization with towel with 3-5 sec holds x 12 (2x/day) - Suboccipital release self mobilization with 30 sec hold (2x/day)   PATIENT EDUCATION:  Education details: Pt was educated on diagnosis, anatomy and pathology involved, prognosis, role of PT, and was given an HEP, demonstrating exercise with proper form and technique following demonstration, VC, and TC, and was given a paper hand out to continue exercise at home. Pt was educated on and agreed to [place]. Person educated: Patient Education method: Explanation, Demonstration, Tactile cues, Verbal cues, and Handouts Education comprehension: verbalized understanding and returned demonstration  HOME EXERCISE PROGRAM: Pt reviewed the  following HEP with pt and was able to demonstrate a set of the following with min cuing for correction needed. PT educated pt on parameters of therex (how/when to inc/decrease intensity, frequency, rep/set range, stretch hold time, and purpose of therex) with verbalized understanding.  Cervical retraction mobilization with towel with 3-5 sec holds x 12 (2x/day) Cervical extension mobilization with towel with 3-5 sec holds x 12 (2x/day) Suboccipital release self mobilization with 30 sec hold (2x/day)  ASSESSMENT:  CLINICAL IMPRESSION: Patient is a 52 y.o. female who was seen today for physical therapy evaluation and treatment for chronic bilat cervicalgia and secondary thoracic back pain. Pt presents with S&S that are in alignment with cervicogenic HAs. Impairments in decreased endurance, decreased bilat suboccipital strength, soft tissue tension, abnormal posture and pain. Activity limitations in sleeping, bathing, dressing and desk job duties; inhibiting full participation in ADLs. Pt would benefit from skilled PT to address aforementioned deficits and promote optimal return to PLOF and improve QoL.  OBJECTIVE IMPAIRMENTS: decreased endurance, decreased ROM, decreased strength, and pain.   ACTIVITY LIMITATIONS: sleeping, bathing, and dressing  PARTICIPATION LIMITATIONS: occupation  PERSONAL FACTORS: 1 comorbidity: osteoporosis  are also affecting patient's functional outcome.   REHAB POTENTIAL: Excellent  CLINICAL DECISION MAKING: Evolving/moderate complexity  EVALUATION COMPLEXITY: Moderate   GOALS: Goals reviewed with patient? Yes  SHORT TERM GOALS: Target date: 02/22/23  Pt will execute HEP independently with proper form and technique in order to return to PLOF at home Baseline: HEP given on 01/25/23 Goal status: INITIAL   LONG TERM GOALS: Target date: 03/22/23  Pt will increase FOTO score to - to demonstrate predicted increase in functional mobility to complete ADLs Baseline:  Deferred to next session Goal status: INITIAL  2.  Pt will decrease worst pain as reported on NPRS by at least 3 points in order to demonstrate clinically significant reduction in pain. Baseline: NPRS - 10/10 Goal status: INITIAL  3.  Pt will demonstrate periscapular strength of 4/5 in order to demonstrate strength needed for ADLs Baseline: 3+ Goal status: INITIAL  4.  Pt will complete DNF test of 29 secs to demonstrate a clinically significant increase in strength needed to complete ADLs Baseline: 20 secs Goal status: INITIAL   PLAN:  PT FREQUENCY: 2x/week  PT DURATION: 8 weeks  PLANNED INTERVENTIONS: Therapeutic exercises, Therapeutic activity, Neuromuscular re-education, Patient/Family education, Self Care, and Manual therapy  PLAN FOR NEXT SESSION: FOTO. Update HEP.    Durwin Reges, PT 01/26/2023, 10:42 AM

## 2023-01-26 NOTE — Addendum Note (Signed)
Addended by: Kelton Pillar on: 01/26/2023 11:14 AM   Modules accepted: Orders

## 2023-01-28 ENCOUNTER — Encounter: Payer: Self-pay | Admitting: Physical Therapy

## 2023-01-28 ENCOUNTER — Ambulatory Visit: Payer: BC Managed Care – PPO | Admitting: Physical Therapy

## 2023-01-28 DIAGNOSIS — M542 Cervicalgia: Secondary | ICD-10-CM

## 2023-01-28 NOTE — Therapy (Signed)
OUTPATIENT PHYSICAL THERAPY CERVICAL EVALUATION   Patient Name: Angela Gordon MRN: [id number] DOB:08-Sep-1971, 52 y.o., female Today's Date: 01/28/2023  END OF SESSION:  PT End of Session - 01/28/23 1436     Visit Number 2    Number of Visits 17    Date for PT Re-Evaluation 02/22/23    PT Start Time [id number]    PT Stop Time 1430    PT Time Calculation (min) 45 min    Activity Tolerance Patient tolerated treatment well    Behavior During Therapy [facility] for tasks assessed/performed              Past Medical History:  Diagnosis Date   Anemia    Asthma    Uses rescue inhaler occasionally.   Complication of anesthesia    slow to wake up   Family history of adverse reaction to anesthesia    Sister- Cardiac arrest during emergency c-section   Family history of ovarian cancer 07/26/2017   Headache    occasional - otc med prn   Heart murmur    was told she had MVP when she was around 24 years, she states she never followed up with that and has never had any issues from it   History of blood transfusion 1992   Blood Loss from Toxemia of Pregnancy   Hypoglycemia    Osteoporosis    Strong FHx of Osteoporosis.   Osteoporosis 10/06/2019   PONV (postoperative nausea and vomiting)    Rectal polyp 01/11/2020   Scoliosis    Seen Dr. Jefm Bryant   Shingles    Sickle-cell trait [facility])    Stroke [facility]) 1992   Toxemia of Pregnancy    Past Surgical History:  Procedure Laterality Date   ABDOMINAL HYSTERECTOMY  2010   total -Menopausal symptoms, takes HRT   BIOPSY  02/26/2020   Procedure: BIOPSY;  Surgeon: Irving Copas., MD;  Location: [place];  Service: Gastroenterology;;   BIOPSY  08/19/2020   Procedure: BIOPSY;  Surgeon: Irving Copas., MD;  Location: [facility] ENDOSCOPY;  Service: Gastroenterology;;   BREAST EXCISIONAL BIOPSY Left 2003   neg bx dr Jamal Collin   BREAST SURGERY Left 2004   biopsy, benign   COLONOSCOPY N/A 08/19/2020   Procedure: COLONOSCOPY;  Surgeon:  Irving Copas., MD;  Location: [place];  Service: Gastroenterology;  Laterality: N/A;   COLONOSCOPY W/ BIOPSIES  05/09/2020   [facility] - removed a "Cap Polyp" per patient   COLONOSCOPY WITH PROPOFOL N/A 11/29/2019   Procedure: COLONOSCOPY WITH PROPOFOL;  Surgeon: Virgel Manifold, MD;  Location: [facility] ENDOSCOPY;  Service: Endoscopy;  Laterality: N/A;   COLONOSCOPY WITH PROPOFOL N/A 02/26/2020   Procedure: COLONOSCOPY WITH PROPOFOL;  Surgeon: Rush Landmark Telford Nab., MD;  Location: [place];  Service: Gastroenterology;  Laterality: N/A;   CYST REMOVAL HAND Right 2005-06   ESOPHAGOGASTRODUODENOSCOPY (EGD) WITH PROPOFOL N/A 08/19/2020   Procedure: ESOPHAGOGASTRODUODENOSCOPY (EGD) WITH PROPOFOL;  Surgeon: Rush Landmark Telford Nab., MD;  Location: [place];  Service: Gastroenterology;  Laterality: N/A;   EUS  02/26/2020   Procedure: LOWER ENDOSCOPIC ULTRASOUND (EUS);  Surgeon: Irving Copas., MD;  Location: [place];  Service: Gastroenterology;;   Otho Darner SIGMOIDOSCOPY  05/17/20, 05/19/20   x 2 at [place] Right 10/16/2020   Procedure: DOUBLE OSTEOTOMY RIGHT;  Surgeon: [name], DPM;  Location: [place];  Service: Podiatry;  Laterality: Right;   HERNIA REPAIR  [id number]   umbilical   TONSILLECTOMY  1978  Patient Active Problem List   Diagnosis Date Noted   Pure hypercholesterolemia 11/27/2020   Cap polyposis ([place]) 07/27/2020   Unintentional weight loss 07/27/2020   Intestinal malabsorption 05/31/2020   Hypocalcemia 05/31/2020   Anemia 05/31/2020   B12 deficiency 05/31/2020   IBD (inflammatory bowel disease) 05/31/2020   Vitamin D deficiency 05/07/2016   Sickle-cell trait ([place]) 05/07/2016   Osteoporosis, 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 Neck pressure the last couple of nights has been a nagging throb. None of the sleep positions have helped to relieve pain. Has been driving a lot lately. Has noticed wet heat has packs. NPS: 2/10 more of a discomfort, nagging pain.   Has Chronic bilat cervicalgia  PERTINENT HISTORY:  Pt is a 52 y.o. female who presents with chronic bilat cervicalgia that started in October of 2023. 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 Since onset the pt has experienced HAs that feel "different than any migraine she's had before". Pt reports her HA pain originates in the suboccipital region and stops in the upper thoracic region. Pt experiences intermittent cervical mechanical sx with rotation and lateral flexion occasionally. Frequency/intensity of HA pain is ISQ. No distal sx. No N/T. Imaging is not available at this time. Pt describes pain as an ongoing throb. Worst pain is 10/10 NPRS. Best pain is 2/10 NPRS. Currently pain is 5/10 NPRS. Pt reports finding relief with muscle relaxants, OTC tension HA medication, heat packs, rest, and her seat massager. Pt does not experience light sensitivity with HAs. ON/OFF: HA pain comes on quickly and does not subside for 1-2 days; must lye down and cannot do much for at least a day. Pt normally sleeps on her side or back and it takes about 1 hr to get back to sleep after taking her pain medications. Pt works as a Scientist, water quality at [employer], and works at her computer for most of the day. PMH: osteoporosis, scoliosis, total hysterectomy, and BPPV. Pt denies N/V, B&B changes,  unexplained weight fluctuation, saddle paresthesia, fever, night sweats, or unrelenting night pain at this time.   PAIN:  Are you having pain? Yes: NPRS scale: C - 5/10; W - 10/10; B - 2/10 Pain location: Suboccipital region to upper thoracic region Pain description: Ongoing throb Aggravating factors: Stress Relieving factors: Heat, rest, seat massager, muscle relaxer, OTC tension HA medication  PRECAUTIONS: None  WEIGHT BEARING RESTRICTIONS: No  FALLS:  Has patient fallen in last 6 months? No  LIVING ENVIRONMENT: Lives with: lives with their family; 75 y.o. son, 60 y.o. son, and 70 y.o. 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  PATIENT SURVEYS:  FOTO - deferred to next session  COGNITION: Overall cognitive status: Within functional limits for tasks assessed  SENSATION: WFL  POSTURE: rounded shoulders, forward head, and flexed trunk   PALPATION: Tenderness in suboccipital muscles and UT/LS  CERVICAL ROM:   Active ROM A/PROM (deg) eval  Flexion 45  Extension 35  Right lateral flexion 45  Left lateral flexion 35  Right rotation 50  Left rotation 60   (Blank rows = not tested)  Passive ROM A/PROM (deg) eval  Flexion WNL; concordant pain  Extension WNL  Right lateral flexion WNL; concordant pain  Left lateral flexion WNL  Right rotation WNL  Left rotation WNL   (Blank rows = not tested)  CPAs/UPAs:  - C2-C3 concordant pain; not better with repeated motion - T1-T5 concordant pain; not better with repeated motion  UPAs: - C2-C3 unremarkable - T1-T5 concordant pain on RUE   MT: - Sub occipital release: relieved concordant pain - Cervical  traction: relieved concordant pain   UPPER EXTREMITY ROM:  Active ROM Right eval Left eval  Shoulder flexion WNL WNL  Shoulder extension WNL WNL  Shoulder abduction WNL WNL  Shoulder adduction    Shoulder extension    Shoulder internal rotation Concordant t-spine pain Concordant t-spine pain  Shoulder external rotation WNL WNL  Elbow flexion    Elbow extension    Wrist flexion    Wrist extension    Wrist ulnar deviation    Wrist radial deviation    Wrist pronation    Wrist supination     (Blank rows = not tested)  Functional shoulder ROM: WFL  UPPER EXTREMITY MMT:  MMT Right eval Left eval  Shoulder flexion 4+ 4+  Shoulder extension    Shoulder abduction 4+ 4+  Shoulder adduction    Shoulder extension    Shoulder internal rotation    Shoulder external rotation    Middle trapezius 3+ 3+  Lower trapezius 3+ 3+  Elbow flexion    Elbow extension    Wrist flexion    Wrist extension    Wrist ulnar deviation    Wrist radial deviation    Wrist pronation    Wrist supination    Grip strength     (Blank rows = not tested)  CERVICAL SPECIAL TESTS:  Neck flexor muscle endurance test: Positive; 20 secs Flexion-rotation test: Positive for cervicogenic headaches  FUNCTIONAL TESTS:  Not indicated  TODAY'S TREATMENT:  DATE: 01/28/23 Therex: - Cervical retractions into pillow with 5 sec holds x 12 - Cervical retraction with head lift for  3 x 10 secs - Prone low row and scaption 2# DB x 12 (bilaterally) - Bird dog alternating UE lifts x 12 (bilaterally) - Shoulder shrugs with 10# DB 2x x 12 (bilaterally)  - Suboccipital stretch 2 x 30 sec holds  MT:  - Suboccipital release 2 x 20 secs - Cervical traction 2 x 20 secs - Cervical PROM (flx/ext/lat flx/rotation), ceased with full ROM and no pain  01/25/23 Therex: - Cervical retraction  mobilization with towel with 3-5 sec holds x 12 (2x/day) - Cervical extension mobilization with towel with 3-5 sec holds x 12 (2x/day) - Suboccipital release self mobilization with 30 sec hold (2x/day)   PATIENT EDUCATION:  Education details: Pt was educated on diagnosis, anatomy and pathology involved, prognosis, role of PT, and was given an HEP, demonstrating exercise with proper form and technique following demonstration, VC, and TC, and was given a paper hand out to continue exercise at home. Pt was educated on and agreed to [place]. Person educated: Patient Education method: Explanation, Demonstration, Tactile cues, Verbal cues, and Handouts Education comprehension: verbalized understanding and returned demonstration  HOME EXERCISE PROGRAM: Pt reviewed the following HEP with pt and was able to demonstrate a set of the following with min cuing for correction needed. PT educated pt on parameters of therex (how/when to inc/decrease intensity, frequency, rep/set range, stretch hold time, and purpose of therex) with verbalized understanding.  Cervical retraction mobilization with towel with 3-5 sec holds x 12 (2x/day) Cervical extension mobilization with towel with 3-5 sec holds x 12 (2x/day) Suboccipital release self mobilization with 30 sec hold (2x/day)  ASSESSMENT:  CLINICAL IMPRESSION: PT utilized manual techniques with therex to decrease soft tissue tension and pain, and improve cervicothoracic mobility with success. PT initiated therex progression to increase periscapular strength with success as well. Pt is able to comply with all cuing for proper form and technique of therex. Pt responds well to manual techniques with decreased pain by 1 point on NPRS following. Pt exhibited muscular fatigue as session went on and gave excellent effort throughout the session. NPRS: 0-1/10 at the end of the session. PT will continue progression as able. Pt would benefit from skilled PT to address  aforementioned deficits and promote optimal return to PLOF and improve QoL.   OBJECTIVE IMPAIRMENTS: decreased endurance, decreased ROM, decreased strength, and pain.   ACTIVITY LIMITATIONS: sleeping, bathing, and dressing  PARTICIPATION LIMITATIONS: occupation  PERSONAL FACTORS: 1 comorbidity: osteoporosis  are also affecting patient's functional outcome.   REHAB POTENTIAL: Excellent  CLINICAL DECISION MAKING: Evolving/moderate complexity  EVALUATION COMPLEXITY: Moderate   GOALS: Goals reviewed with patient? Yes  SHORT TERM GOALS: Target date: 02/22/23  Pt will execute HEP independently with proper form and technique in order to return to PLOF at home Baseline: HEP given on 01/25/23 Goal status: INITIAL   LONG TERM GOALS: Target date: 03/22/23  Pt will increase FOTO score to - to demonstrate predicted increase in functional mobility to complete ADLs Baseline: Deferred to next session Goal status: INITIAL  2.  Pt will decrease worst pain as reported on NPRS by at least 3 points in order to demonstrate clinically significant reduction in pain. Baseline: NPRS - 10/10 Goal status: INITIAL  3.  Pt will demonstrate periscapular strength of 4/5 in order to demonstrate strength needed for ADLs Baseline: 3+ Goal status: INITIAL  4.  Pt will  complete DNF test of 29 secs to demonstrate a clinically significant increase in strength needed to complete ADLs Baseline: 20 secs Goal status: INITIAL   PLAN:  PT FREQUENCY: 2x/week  PT DURATION: 8 weeks  PLANNED INTERVENTIONS: Therapeutic exercises, Therapeutic activity, Neuromuscular re-education, Patient/Family education, Self Care, and Manual therapy  PLAN FOR NEXT SESSION: FOTO. Update HEP.    Stanford Scotland, Student-PT 01/28/2023, 2:37 PM

## 2023-02-01 ENCOUNTER — Encounter: Payer: Self-pay | Admitting: Physical Therapy

## 2023-02-01 ENCOUNTER — Ambulatory Visit: Payer: BC Managed Care – PPO | Attending: Nurse Practitioner | Admitting: Physical Therapy

## 2023-02-01 DIAGNOSIS — M542 Cervicalgia: Secondary | ICD-10-CM | POA: Insufficient documentation
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
## 2023-02-03 ENCOUNTER — Ambulatory Visit: Payer: BC Managed Care – PPO | Admitting: Physical Therapy

## 2023-02-04 ENCOUNTER — Encounter: Payer: Self-pay | Admitting: Physical Therapy

## 2023-02-04 ENCOUNTER — Ambulatory Visit: Payer: BC Managed Care – PPO | Admitting: Physical Therapy

## 2023-02-04 DIAGNOSIS — M542 Cervicalgia: Secondary | ICD-10-CM

## 2023-02-04 NOTE — Therapy (Signed)
OUTPATIENT PHYSICAL THERAPY CERVICAL EVALUATION   Patient Name: Angela Gordon MRN: [id number] DOB:05-08-1971, 52 y.o., female Today's Date: 02/04/2023  END OF SESSION:  PT End of Session - 02/04/23 1339     Visit Number 4    Number of Visits 17    Date for PT Re-Evaluation 02/22/23    Authorization - Visit Number 4    Authorization - Number of Visits 10    Progress Note Due on Visit 10    PT Start Time [id number]    PT Stop Time 1425    PT Time Calculation (min) 40 min    Activity Tolerance Patient tolerated treatment well    Behavior During Therapy WFL for tasks assessed/performed                Past Medical History:  Diagnosis Date   Anemia    Asthma    Uses rescue inhaler occasionally.   Complication of anesthesia    slow to wake up   Family history of adverse reaction to anesthesia    Sister- Cardiac arrest during emergency c-section   Family history of ovarian cancer 07/26/2017   Headache    occasional - otc med prn   Heart murmur    was told she had MVP when she was around 24 years, she states she never followed up with that and has never had any issues from it   History of blood transfusion 1992   Blood Loss from Toxemia of Pregnancy   Hypoglycemia    Osteoporosis    Strong FHx of Osteoporosis.   Osteoporosis 10/06/2019   PONV (postoperative nausea and vomiting)    Rectal polyp 01/11/2020   Scoliosis    Seen Dr. Jefm Bryant   Shingles    Sickle-cell trait [facility])    Stroke [facility]) 1992   Toxemia of Pregnancy    Past Surgical History:  Procedure Laterality Date   ABDOMINAL HYSTERECTOMY  2010   total -Menopausal symptoms, takes HRT   BIOPSY  02/26/2020   Procedure: BIOPSY;  Surgeon: Irving Copas., MD;  Location: [place];  Service: Gastroenterology;;   BIOPSY  08/19/2020   Procedure: BIOPSY;  Surgeon: Irving Copas., MD;  Location: [facility] ENDOSCOPY;  Service: Gastroenterology;;   BREAST EXCISIONAL BIOPSY Left 2003   neg bx dr Jamal Collin    BREAST SURGERY Left 2004   biopsy, benign   COLONOSCOPY N/A 08/19/2020   Procedure: COLONOSCOPY;  Surgeon: Irving Copas., MD;  Location: [place];  Service: Gastroenterology;  Laterality: N/A;   COLONOSCOPY W/ BIOPSIES  05/09/2020   [facility] - removed a "Cap Polyp" per patient   COLONOSCOPY WITH PROPOFOL N/A 11/29/2019   Procedure: COLONOSCOPY WITH PROPOFOL;  Surgeon: Virgel Manifold, MD;  Location: [facility] ENDOSCOPY;  Service: Endoscopy;  Laterality: N/A;   COLONOSCOPY WITH PROPOFOL N/A 02/26/2020   Procedure: COLONOSCOPY WITH PROPOFOL;  Surgeon: Rush Landmark Telford Nab., MD;  Location: [place];  Service: Gastroenterology;  Laterality: N/A;   CYST REMOVAL HAND Right 2005-06   ESOPHAGOGASTRODUODENOSCOPY (EGD) WITH PROPOFOL N/A 08/19/2020   Procedure: ESOPHAGOGASTRODUODENOSCOPY (EGD) WITH PROPOFOL;  Surgeon: Rush Landmark Telford Nab., MD;  Location: [place];  Service: Gastroenterology;  Laterality: N/A;   EUS  02/26/2020   Procedure: LOWER ENDOSCOPIC ULTRASOUND (EUS);  Surgeon: Irving Copas., MD;  Location: [place];  Service: Gastroenterology;;   Otho Darner SIGMOIDOSCOPY  05/17/20, 05/19/20   x 2 at [place] Right 10/16/2020   Procedure: DOUBLE OSTEOTOMY RIGHT;  Surgeon: Vickki Muff,  Larkin Ina, DPM;  Location: [place];  Service: Podiatry;  Laterality: Right;   HERNIA REPAIR  [id number]   umbilical   TONSILLECTOMY  1978   Patient Active Problem List   Diagnosis Date Noted   Pure hypercholesterolemia 11/27/2020   Cap polyposis ([name]) 07/27/2020   Unintentional weight loss 07/27/2020   Intestinal malabsorption 05/31/2020   Hypocalcemia 05/31/2020   Anemia 05/31/2020   B12 deficiency 05/31/2020   IBD (inflammatory bowel disease) 05/31/2020   Vitamin D deficiency 05/07/2016   Sickle-cell trait ([place]) 05/07/2016   Osteoporosis, 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 Reports her towel exercise is very helpful.   PERTINENT HISTORY:  Pt is a 53 y.o. female who presents with chronic bilat cervicalgia that started in October of 2023. 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 Since onset the pt has experienced HAs that feel "different than any migraine she's had before". Pt reports her HA pain originates in the suboccipital region and stops in the upper thoracic region. Pt experiences intermittent cervical mechanical sx with rotation and lateral flexion occasionally. Frequency/intensity of HA pain is ISQ. No distal sx. No N/T. Imaging is not available at this time. Pt describes pain as an ongoing throb. Worst pain is 10/10 NPRS. Best pain is 2/10 NPRS. Currently pain is 5/10 NPRS. Pt reports finding relief with muscle relaxants, OTC tension HA medication, heat packs, rest, and her seat massager. Pt does not experience light sensitivity with HAs. ON/OFF: HA pain comes on quickly and does not subside for 1-2 days; must lye down and cannot do much for at least a day. Pt normally sleeps on her side or back and it takes about 1 hr to get back to sleep after taking her pain medications. Pt works as a Scientist, water quality at [employer], and works at her computer for most of the day. PMH: osteoporosis, scoliosis, total hysterectomy, and BPPV. Pt denies N/V, B&B changes, unexplained weight fluctuation, saddle paresthesia, fever, night  sweats, or unrelenting night pain at this  time.   PAIN:  Are you having pain? Yes: NPRS scale: C - 5/10; W - 10/10; B - 2/10 Pain location: Suboccipital region to upper thoracic region Pain description: Ongoing throb Aggravating factors: Stress Relieving factors: Heat, rest, seat massager, muscle relaxer, OTC tension HA medication  PRECAUTIONS: None  WEIGHT BEARING RESTRICTIONS: No  FALLS:  Has patient fallen in last 6 months? No  LIVING ENVIRONMENT: Lives with: lives with their family; 44 y.o. son, 41 y.o. son, and 86 y.o. 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  PATIENT SURVEYS:  FOTO - deferred to next session  COGNITION: Overall cognitive status: Within functional limits for tasks assessed  SENSATION: WFL  POSTURE: rounded shoulders, forward head, and flexed trunk   PALPATION: Tenderness in suboccipital muscles and UT/LS  CERVICAL ROM:   Active ROM A/PROM (deg) eval  Flexion 45  Extension 35  Right lateral flexion 45  Left lateral flexion 35  Right rotation 50  Left rotation 60   (Blank rows = not tested)  Passive ROM A/PROM (deg) eval  Flexion WNL; concordant pain  Extension WNL  Right lateral flexion WNL; concordant pain  Left lateral flexion WNL  Right rotation WNL  Left rotation WNL   (Blank rows = not tested)  CPAs/UPAs:  - C2-C3 concordant pain; not better with repeated motion - T1-T5 concordant pain; not better with repeated motion  UPAs: - C2-C3 unremarkable - T1-T5 concordant pain on RUE   MT: - Sub occipital release: relieved concordant pain - Cervical traction: relieved concordant pain   UPPER EXTREMITY ROM:  Active ROM Right eval Left eval  Shoulder  flexion WNL WNL  Shoulder extension WNL WNL  Shoulder abduction WNL WNL  Shoulder adduction    Shoulder extension    Shoulder internal rotation Concordant t-spine pain Concordant t-spine pain  Shoulder external rotation WNL WNL  Elbow flexion    Elbow extension    Wrist flexion    Wrist extension    Wrist ulnar deviation    Wrist radial deviation    Wrist pronation    Wrist supination     (Blank rows = not tested)  Functional shoulder ROM: WFL  UPPER EXTREMITY MMT:  MMT Right eval Left eval  Shoulder flexion 4+ 4+  Shoulder extension    Shoulder abduction 4+ 4+  Shoulder adduction    Shoulder extension    Shoulder internal rotation    Shoulder external rotation    Middle trapezius 3+ 3+  Lower trapezius 3+ 3+  Elbow flexion    Elbow extension    Wrist flexion    Wrist extension    Wrist ulnar deviation    Wrist radial deviation    Wrist pronation    Wrist supination    Grip strength     (Blank rows = not tested)  CERVICAL SPECIAL TESTS:  Neck flexor muscle endurance test: Positive; 20 secs Flexion-rotation test: Positive for cervicogenic headaches  FUNCTIONAL TESTS:  Not indicated  TODAY'S TREATMENT:  DATE:02/01/23 Therex: - Nustep seat 8 UE 11 L3 33mns for scapular re/protraction with cuing for neutral cspine with decent carry over Seated thoracic ext with towel roll in chair x12 with concordant cervical ext Seated thoracic rotation with pink Tball overhead x12 with good carry of technique Seated bilat ER GTB 2x 10 with min cuing for eccentric control with good carry over Y on ball 2x 10 with min cuing for initial set up/technique with good carry over OMEGA seated row 25# 2x 12 with min cuing for set up with good carry over Suboccipital stretch x30 sec holds    PATIENT EDUCATION:  Education details: Pt was educated on diagnosis,  anatomy and pathology involved, prognosis, role of PT, and was given an HEP, demonstrating exercise with proper form and technique following demonstration, VC, and TC, and was given a paper hand out to continue exercise at home. Pt was educated on and agreed to PEast Lynne Person educated: Patient Education method: Explanation, Demonstration, Tactile cues, Verbal cues, and Handouts Education comprehension: verbalized understanding and returned demonstration  HOME EXERCISE PROGRAM: Pt reviewed the following HEP with pt and was able to demonstrate a set of the following with min cuing for correction needed. PT educated pt on parameters of therex (how/when to inc/decrease intensity, frequency, rep/set range, stretch hold time, and purpose of therex) with verbalized understanding.  Cervical retraction mobilization with towel with 3-5 sec holds x 12 (2x/day) Cervical extension mobilization with towel with 3-5 sec holds x 12 (2x/day) Suboccipital release self mobilization with 30 sec hold (2x/day)  ASSESSMENT:  CLINICAL IMPRESSION: In lieu of pain PT continued therex progression to increase periscapular strength with neutral cspine with success as well. Pt is able to comply with all cuing for proper form and technique of therex. PT will continue progression as able. Pt would benefit from skilled PT to address aforementioned deficits and promote optimal return to PLOF and improve QoL.   OBJECTIVE IMPAIRMENTS: decreased endurance, decreased ROM, decreased strength, and pain.   ACTIVITY LIMITATIONS: sleeping, bathing, and dressing  PARTICIPATION LIMITATIONS: occupation  PERSONAL FACTORS: 1 comorbidity: osteoporosis  are also affecting patient's functional outcome.   REHAB POTENTIAL: Excellent  CLINICAL DECISION MAKING: Evolving/moderate complexity  EVALUATION COMPLEXITY: Moderate   GOALS: Goals reviewed with patient? Yes  SHORT TERM GOALS: Target date: 02/22/23  Pt will execute HEP independently  with proper form and technique in order to return to PLOF at home Baseline: HEP given on 01/25/23 Goal status: INITIAL   LONG TERM GOALS: Target date: 03/22/23  Pt will increase FOTO score to - to demonstrate predicted increase in functional mobility to complete ADLs Baseline: Deferred to next session Goal status: INITIAL  2.  Pt will decrease worst pain as reported on NPRS by at least 3 points in order to demonstrate clinically significant reduction in pain. Baseline: NPRS - 10/10 Goal status: INITIAL  3.  Pt will demonstrate periscapular strength of 4/5 in order to demonstrate strength needed for ADLs Baseline: 3+ Goal status: INITIAL  4.  Pt will complete DNF test of 29 secs to demonstrate a clinically significant increase in strength needed to complete ADLs Baseline: 20 secs Goal status: INITIAL   PLAN:  PT FREQUENCY: 2x/week  PT DURATION: 8 weeks  PLANNED INTERVENTIONS: Therapeutic exercises, Therapeutic activity, Neuromuscular re-education, Patient/Family education, Self Care, and Manual therapy  PLAN FOR NEXT SESSION: FOTO. Update HEP.    CDurwin Reges PT 02/04/2023, 2:35 PM

## 2023-02-08 ENCOUNTER — Ambulatory Visit: Payer: BC Managed Care – PPO | Admitting: Physical Therapy

## 2023-02-08 ENCOUNTER — Telehealth: Payer: Self-pay

## 2023-02-08 NOTE — Telephone Encounter (Signed)
Pt did not show for 02/08/23 1515 hrs appointment. Author contacted patient who reports she previously asked this appointment be canceled and rescheduled, which is why she has an appointment scheduled currently for 02/09/23. Author will assume this was a mistake made by clinic. Pt made aware of next scheduled appointment.   3:42 PM, 02/08/23 Etta Grandchild, PT, DPT Physical Therapist - [place] [phone] (Office)

## 2023-02-09 ENCOUNTER — Encounter: Payer: Self-pay | Admitting: Physical Therapy

## 2023-02-09 ENCOUNTER — Ambulatory Visit: Payer: BC Managed Care – PPO | Admitting: Physical Therapy

## 2023-02-09 DIAGNOSIS — M542 Cervicalgia: Secondary | ICD-10-CM | POA: Diagnosis not present

## 2023-02-09 NOTE — Therapy (Signed)
OUTPATIENT PHYSICAL THERAPY CERVICAL EVALUATION   Patient Name: Angela Gordon MRN: [id number] DOB:21-Dec-1970, 52 y.o., female Today's Date: 02/09/2023  END OF SESSION:  PT End of Session - 02/09/23 1423     Visit Number 5    Number of Visits 17    Date for PT Re-Evaluation 02/22/23    Authorization - Visit Number 5    Authorization - Number of Visits 10    Progress Note Due on Visit 10    PT Start Time [id number]    PT Stop Time 1510    PT Time Calculation (min) 39 min    Activity Tolerance Patient tolerated treatment well    Behavior During Therapy WFL for tasks assessed/performed                Past Medical History:  Diagnosis Date   Anemia    Asthma    Uses rescue inhaler occasionally.   Complication of anesthesia    slow to wake up   Family history of adverse reaction to anesthesia    Sister- Cardiac arrest during emergency c-section   Family history of ovarian cancer 07/26/2017   Headache    occasional - otc med prn   Heart murmur    was told she had MVP when she was around 24 years, she states she never followed up with that and has never had any issues from it   History of blood transfusion 1992   Blood Loss from Toxemia of Pregnancy   Hypoglycemia    Osteoporosis    Strong FHx of Osteoporosis.   Osteoporosis 10/06/2019   PONV (postoperative nausea and vomiting)    Rectal polyp 01/11/2020   Scoliosis    Seen Dr. Jefm Bryant   Shingles    Sickle-cell trait [facility])    Stroke [facility]) 1992   Toxemia of Pregnancy    Past Surgical History:  Procedure Laterality Date   ABDOMINAL HYSTERECTOMY  2010   total -Menopausal symptoms, takes HRT   BIOPSY  02/26/2020   Procedure: BIOPSY;  Surgeon: Irving Copas., MD;  Location: [place];  Service: Gastroenterology;;   BIOPSY  08/19/2020   Procedure: BIOPSY;  Surgeon: Irving Copas., MD;  Location: [facility] ENDOSCOPY;  Service: Gastroenterology;;   BREAST EXCISIONAL BIOPSY Left 2003   neg bx dr Jamal Collin    BREAST SURGERY Left 2004   biopsy, benign   COLONOSCOPY N/A 08/19/2020   Procedure: COLONOSCOPY;  Surgeon: Irving Copas., MD;  Location: [place];  Service: Gastroenterology;  Laterality: N/A;   COLONOSCOPY W/ BIOPSIES  05/09/2020   [facility] - removed a "Cap Polyp" per patient   COLONOSCOPY WITH PROPOFOL N/A 11/29/2019   Procedure: COLONOSCOPY WITH PROPOFOL;  Surgeon: Virgel Manifold, MD;  Location: [facility] ENDOSCOPY;  Service: Endoscopy;  Laterality: N/A;   COLONOSCOPY WITH PROPOFOL N/A 02/26/2020   Procedure: COLONOSCOPY WITH PROPOFOL;  Surgeon: Rush Landmark Telford Nab., MD;  Location: [place];  Service: Gastroenterology;  Laterality: N/A;   CYST REMOVAL HAND Right 2005-06   ESOPHAGOGASTRODUODENOSCOPY (EGD) WITH PROPOFOL N/A 08/19/2020   Procedure: ESOPHAGOGASTRODUODENOSCOPY (EGD) WITH PROPOFOL;  Surgeon: Rush Landmark Telford Nab., MD;  Location: [place];  Service: Gastroenterology;  Laterality: N/A;   EUS  02/26/2020   Procedure: LOWER ENDOSCOPIC ULTRASOUND (EUS);  Surgeon: Irving Copas., MD;  Location: [place];  Service: Gastroenterology;;   Otho Darner SIGMOIDOSCOPY  05/17/20, 05/19/20   x 2 at [place] Right 10/16/2020   Procedure: DOUBLE OSTEOTOMY RIGHT;  Surgeon: Vickki Muff,  Larkin Ina, DPM;  Location: [place];  Service: Podiatry;  Laterality: Right;   HERNIA REPAIR  [id number]   umbilical   TONSILLECTOMY  1978   Patient Active Problem List   Diagnosis Date Noted   Pure hypercholesterolemia 11/27/2020   Cap polyposis ([place]) 07/27/2020   Unintentional weight loss 07/27/2020   Intestinal malabsorption 05/31/2020   Hypocalcemia 05/31/2020   Anemia 05/31/2020   B12 deficiency 05/31/2020   IBD (inflammatory bowel disease) 05/31/2020   Vitamin D deficiency 05/07/2016   Sickle-cell trait ([place]) 05/07/2016   Osteoporosis, 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 No headaches or neck pain since last session.   PERTINENT HISTORY:  Pt is a 52 y.o. female who presents with chronic bilat cervicalgia that started in October of 2023. 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 Since onset the pt has experienced HAs that feel "different than any migraine she's had before". Pt reports her HA pain originates in the suboccipital region and stops in the upper thoracic region. Pt experiences intermittent cervical mechanical sx with rotation and lateral flexion occasionally. Frequency/intensity of HA pain is ISQ. No distal sx. No N/T. Imaging is not available at this time. Pt describes pain as an ongoing throb. Worst pain is 10/10 NPRS. Best pain is 2/10 NPRS. Currently pain is 5/10 NPRS. Pt reports finding relief with muscle relaxants, OTC tension HA medication, heat packs, rest, and her seat massager. Pt does not experience light sensitivity with HAs. ON/OFF: HA pain comes on quickly and does not subside for 1-2 days; must lye down and cannot do much for at least a day. Pt normally sleeps on her side or back and it takes about 1 hr to get back to sleep after taking her pain medications. Pt works as a Scientist, water quality at [employer], and works at her computer for most of the day. PMH: osteoporosis, scoliosis, total hysterectomy, and BPPV. Pt denies N/V, B&B changes, unexplained weight fluctuation, saddle paresthesia, fever, night sweats, or unrelenting night  pain at this time.   PAIN:  Are  you having pain? Yes: NPRS scale: C - 5/10; W - 10/10; B - 2/10 Pain location: Suboccipital region to upper thoracic region Pain description: Ongoing throb Aggravating factors: Stress Relieving factors: Heat, rest, seat massager, muscle relaxer, OTC tension HA medication  PRECAUTIONS: None  WEIGHT BEARING RESTRICTIONS: No  FALLS:  Has patient fallen in last 6 months? No  LIVING ENVIRONMENT: Lives with: lives with their family; 69 y.o. son, 24 y.o. son, and 59 y.o. 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  PATIENT SURVEYS:  FOTO - deferred to next session  COGNITION: Overall cognitive status: Within functional limits for tasks assessed  SENSATION: WFL  POSTURE: rounded shoulders, forward head, and flexed trunk   PALPATION: Tenderness in suboccipital muscles and UT/LS  CERVICAL ROM:   Active ROM A/PROM (deg) eval  Flexion 45  Extension 35  Right lateral flexion 45  Left lateral flexion 35  Right rotation 50  Left rotation 60   (Blank rows = not tested)  Passive ROM A/PROM (deg) eval  Flexion WNL; concordant pain  Extension WNL  Right lateral flexion WNL; concordant pain  Left lateral flexion WNL  Right rotation WNL  Left rotation WNL   (Blank rows = not tested)  CPAs/UPAs:  - C2-C3 concordant pain; not better with repeated motion - T1-T5 concordant pain; not better with repeated motion  UPAs: - C2-C3 unremarkable - T1-T5 concordant pain on RUE   MT: - Sub occipital release: relieved concordant pain - Cervical traction: relieved concordant pain   UPPER EXTREMITY ROM:  Active ROM Right eval Left eval  Shoulder flexion WNL WNL  Shoulder  extension WNL WNL  Shoulder abduction WNL WNL  Shoulder adduction    Shoulder extension    Shoulder internal rotation Concordant t-spine pain Concordant t-spine pain  Shoulder external rotation WNL WNL  Elbow flexion    Elbow extension    Wrist flexion    Wrist extension    Wrist ulnar deviation    Wrist radial deviation    Wrist pronation    Wrist supination     (Blank rows = not tested)  Functional shoulder ROM: WFL  UPPER EXTREMITY MMT:  MMT Right eval Left eval  Shoulder flexion 4+ 4+  Shoulder extension    Shoulder abduction 4+ 4+  Shoulder adduction    Shoulder extension    Shoulder internal rotation    Shoulder external rotation    Middle trapezius 3+ 3+  Lower trapezius 3+ 3+  Elbow flexion    Elbow extension    Wrist flexion    Wrist extension    Wrist ulnar deviation    Wrist radial deviation    Wrist pronation    Wrist supination    Grip strength     (Blank rows = not tested)  CERVICAL SPECIAL TESTS:  Neck flexor muscle endurance test: Positive; 20 secs Flexion-rotation test: Positive for cervicogenic headaches  FUNCTIONAL TESTS:  Not indicated  TODAY'S TREATMENT:  DATE:02/09/23 Therex: - Nustep seat 8 UE 11 L3 63mns for scapular re/protraction with cuing for neutral cspine with decent carry over - Prone hands behind head chest lift/tspine ext 2x 12 with good carry over following demo  - Good mornings PVC 2x 12 with cuing to maintain PVC pipe height with scapular retraction with good carry over - Y on ball 2x 10 with min cuing for initial set up/technique with good carry over - Seated band pull aparts GTB 2x 10 with cuing for initial technique with good carry over Seated thoracic ext with towel roll in chair x12 with concordant cervical ext Seated thoracic rotation with pink Tball overhead x12 with good carry of  technique Suboccipital stretch x30 sec holds    PATIENT EDUCATION:  Education details: Pt was educated on diagnosis, anatomy and pathology involved, prognosis, role of PT, and was given an HEP, demonstrating exercise with proper form and technique following demonstration, VC, and TC, and was given a paper hand out to continue exercise at home. [place] educated [place] and agreed to [place] educated: Patient Education method: Explanation, Demonstration, Tactile cues, Verbal cues, and Handouts Education comprehension: verbalized understanding and returned demonstration  HOME EXERCISE PROGRAM: Pt reviewed the following HEP with pt and was able to demonstrate a set of the following with min cuing for correction needed. PT educated pt on parameters of therex (how/when to inc/decrease intensity, frequency, rep/set range, stretch hold time, and purpose of therex) with verbalized understanding.  Cervical retraction mobilization with towel with 3-5 sec holds x 12 (2x/day) Cervical extension mobilization with towel with 3-5 sec holds x 12 (2x/day) Suboccipital release self mobilization with 30 sec hold (2x/day)  ASSESSMENT:  CLINICAL IMPRESSION: In lieu of pain PT continued therex progression to increase periscapular strength with neutral cspine with success as well. Pt is able to comply with all cuing for proper form and technique of therex. PT will continue progression as able. Pt would benefit from skilled PT to address aforementioned deficits and promote optimal return to PLOF and improve QoL.   OBJECTIVE IMPAIRMENTS: decreased endurance, decreased ROM, decreased strength, and pain.   ACTIVITY LIMITATIONS: sleeping, bathing, and dressing  PARTICIPATION LIMITATIONS: occupation  PERSONAL FACTORS: 1 comorbidity: osteoporosis  are also affecting patient's functional outcome.   REHAB POTENTIAL: Excellent  CLINICAL DECISION MAKING: Evolving/moderate complexity  EVALUATION COMPLEXITY:  Moderate   GOALS: Goals reviewed with patient? Yes  SHORT TERM GOALS: Target date: 02/22/23  Pt will execute HEP independently with proper form and technique in order to return to PLOF at home Baseline: HEP given on 01/25/23 Goal status: INITIAL   LONG TERM GOALS: Target date: 03/22/23  Pt will increase FOTO score to - to demonstrate predicted increase in functional mobility to complete ADLs Baseline: Deferred to next session Goal status: INITIAL  2.  Pt will decrease worst pain as reported on NPRS by at least 3 points in order to demonstrate clinically significant reduction in pain. Baseline: NPRS - 10/10 Goal status: INITIAL  3.  Pt will demonstrate periscapular strength of 4/5 in order to demonstrate strength needed for ADLs Baseline: 3+ Goal status: INITIAL  4.  Pt will complete DNF test of 29 secs to demonstrate a clinically significant increase in strength needed to complete ADLs Baseline: 20 secs Goal status: INITIAL   PLAN:  PT FREQUENCY: 2x/week  PT DURATION: 8 weeks  PLANNED INTERVENTIONS: Therapeutic exercises, Therapeutic activity, Neuromuscular re-education, Patient/Family education, Self Care, and Manual therapy  PLAN FOR NEXT SESSION: FOTO.  Update HEP.    Durwin Reges, PT 02/09/2023, 3:10 PM

## 2023-02-16 ENCOUNTER — Ambulatory Visit: Payer: BC Managed Care – PPO | Admitting: Physical Therapy

## 2023-02-18 ENCOUNTER — Ambulatory Visit: Payer: BC Managed Care – PPO | Admitting: Physical Therapy

## 2023-02-18 ENCOUNTER — Encounter: Payer: Self-pay | Admitting: Physical Therapy

## 2023-02-18 DIAGNOSIS — M542 Cervicalgia: Secondary | ICD-10-CM | POA: Diagnosis not present

## 2023-02-18 NOTE — Therapy (Signed)
OUTPATIENT PHYSICAL THERAPY CERVICAL TREATMENT/DC SUMMARY Reporting Period 01/26/23 - 02/18/23   Patient Name: Angela Gordon MRN: [id number] DOB:[birth year], 52 y.o., female Today's Date: 02/18/2023  END OF SESSION:  PT End of Session - 02/18/23 1426     Visit Number 6    Number of Visits 17    Date for PT Re-Evaluation 03/30/23    Authorization - Visit Number 6    Authorization - Number of Visits 10    Progress Note Due on Visit 10    PT Start Time 1430    PT Stop Time 1455    PT Time Calculation (min) 25 min    Activity Tolerance Patient tolerated treatment well    Behavior During Therapy WFL for tasks assessed/performed                 Past Medical History:  Diagnosis Date   Anemia    Asthma    Uses rescue inhaler occasionally.   Complication of anesthesia    slow to wake up   Family history of adverse reaction to anesthesia    Sister- Cardiac arrest during emergency c-section   Family history of ovarian cancer 07/26/2017   Headache    occasional - otc med prn   Heart murmur    was told she had MVP when she was around 24 years, she states she never followed up with that and has never had any issues from it   History of blood transfusion 1992   Blood Loss from Toxemia of Pregnancy   Hypoglycemia    Osteoporosis    Strong FHx of Osteoporosis.   Osteoporosis 10/06/2019   PONV (postoperative nausea and vomiting)    Rectal polyp 01/11/2020   Scoliosis    Seen Dr. Jefm Bryant   Shingles    Sickle-cell trait [facility])    Stroke [facility]) 1992   Toxemia of Pregnancy    Past Surgical History:  Procedure Laterality Date   ABDOMINAL HYSTERECTOMY  2010   total -Menopausal symptoms, takes HRT   BIOPSY  02/26/2020   Procedure: BIOPSY;  Surgeon: Irving Copas., MD;  Location: [place];  Service: Gastroenterology;;   BIOPSY  08/19/2020   Procedure: BIOPSY;  Surgeon: Irving Copas., MD;  Location: [facility] ENDOSCOPY;  Service: Gastroenterology;;   BREAST  EXCISIONAL BIOPSY Left 2003   neg bx dr Jamal Collin   BREAST SURGERY Left 2004   biopsy, benign   COLONOSCOPY N/A 08/19/2020   Procedure: COLONOSCOPY;  Surgeon: Irving Copas., MD;  Location: [place];  Service: Gastroenterology;  Laterality: N/A;   COLONOSCOPY W/ BIOPSIES  05/09/2020   [facility] - removed a "Cap Polyp" per patient   COLONOSCOPY WITH PROPOFOL N/A 11/29/2019   Procedure: COLONOSCOPY WITH PROPOFOL;  Surgeon: Virgel Manifold, MD;  Location: [facility] ENDOSCOPY;  Service: Endoscopy;  Laterality: N/A;   COLONOSCOPY WITH PROPOFOL N/A 02/26/2020   Procedure: COLONOSCOPY WITH PROPOFOL;  Surgeon: Rush Landmark Telford Nab., MD;  Location: [place];  Service: Gastroenterology;  Laterality: N/A;   CYST REMOVAL HAND Right 2005-06   ESOPHAGOGASTRODUODENOSCOPY (EGD) WITH PROPOFOL N/A 08/19/2020   Procedure: ESOPHAGOGASTRODUODENOSCOPY (EGD) WITH PROPOFOL;  Surgeon: Rush Landmark Telford Nab., MD;  Location: [place];  Service: Gastroenterology;  Laterality: N/A;   EUS  02/26/2020   Procedure: LOWER ENDOSCOPIC ULTRASOUND (EUS);  Surgeon: Irving Copas., MD;  Location: [place];  Service: Gastroenterology;;   Otho Darner SIGMOIDOSCOPY  05/17/20, 05/19/20   x 2 at [place] Right 10/16/2020  Procedure: DOUBLE OSTEOTOMY RIGHT;  Surgeon: [name], DPM;  Location: [place];  Service: Podiatry;  Laterality: Right;   HERNIA REPAIR  [id number]   umbilical   TONSILLECTOMY  1978   Patient Active Problem List   Diagnosis Date Noted   Pure hypercholesterolemia 11/27/2020   Cap polyposis ([place]) 07/27/2020   Unintentional weight loss 07/27/2020   Intestinal malabsorption 05/31/2020   Hypocalcemia 05/31/2020   Anemia 05/31/2020   B12 deficiency 05/31/2020   IBD (inflammatory bowel disease) 05/31/2020   Vitamin D deficiency 05/07/2016   Sickle-cell trait ([place]) 05/07/2016   Osteoporosis, 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 Feels like she can manage her pain ind to d/c PT.   PERTINENT HISTORY:  Pt is a 52 y.o. female who presents with chronic bilat cervicalgia that started in October of 2023. 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 Since onset the pt has experienced HAs that feel "different than any migraine she's had before". Pt reports her HA pain originates in the suboccipital region and stops in the upper thoracic region. Pt experiences intermittent cervical mechanical sx with rotation and lateral flexion occasionally. Frequency/intensity of HA pain is ISQ. No distal sx. No N/T. Imaging is not available at this time. Pt describes pain as an ongoing throb. Worst pain is 10/10 NPRS. Best pain is 2/10 NPRS. Currently pain is 5/10 NPRS. Pt reports finding relief with muscle relaxants, OTC tension HA medication, heat packs, rest, and her seat massager. Pt does not experience light sensitivity with HAs. ON/OFF: HA pain comes on quickly and does not subside for 1-2 days; must lye down and cannot do much for at least a day. Pt normally sleeps on her side or back and it takes about 1 hr to get back to sleep after taking her pain medications. Pt works as a Scientist, water quality at [employer], and works at her computer for most of the day. PMH: osteoporosis, scoliosis, total hysterectomy, and BPPV. Pt denies N/V, B&B changes, unexplained  weight fluctuation, saddle paresthesia, fever, night  sweats, or unrelenting night pain at this time.   PAIN:  Are you having pain? Yes: NPRS scale: C - 5/10; W - 10/10; B - 2/10 Pain location: Suboccipital region to upper thoracic region Pain description: Ongoing throb Aggravating factors: Stress Relieving factors: Heat, rest, seat massager, muscle relaxer, OTC tension HA medication  PRECAUTIONS: None  WEIGHT BEARING RESTRICTIONS: No  FALLS:  Has patient fallen in last 6 months? No  LIVING ENVIRONMENT: Lives with: lives with their family; 68 y.o. son, 23 y.o. son, and 81 y.o. 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: Mitigate symptoms; minimizing and controlling frequency/severity of headaches  NEXT MD VISIT:  OBJECTIVE:   DIAGNOSTIC FINDINGS:  Imaging is unavailable at this time.  PATIENT SURVEYS:  FOTO - deferred to next session  COGNITION: Overall cognitive status: Within functional limits for tasks assessed  SENSATION: WFL  POSTURE: rounded shoulders, forward head, and flexed trunk   PALPATION: Tenderness in suboccipital muscles and UT/LS  CERVICAL ROM:   Active ROM A/PROM (deg) eval  Flexion 45  Extension 35  Right lateral flexion 45  Left lateral flexion 35  Right rotation 50  Left rotation 60   (Blank rows = not tested)  Passive ROM A/PROM (deg) eval  Flexion WNL; concordant pain  Extension WNL  Right lateral flexion WNL; concordant pain  Left lateral flexion WNL  Right rotation WNL  Left rotation WNL   (Blank rows = not tested)  CPAs/UPAs:  - C2-C3 concordant pain; not better with repeated motion - T1-T5 concordant pain; not better with repeated motion  UPAs: - C2-C3 unremarkable - T1-T5 concordant pain on RUE   MT: - Sub occipital release: relieved concordant pain - Cervical traction: relieved concordant pain   UPPER EXTREMITY ROM:  Active  ROM Right eval Left eval  Shoulder flexion WNL WNL  Shoulder extension WNL WNL  Shoulder abduction WNL WNL  Shoulder adduction    Shoulder extension    Shoulder internal rotation Concordant t-spine pain Concordant t-spine pain  Shoulder external rotation WNL WNL  Elbow flexion    Elbow extension    Wrist flexion    Wrist extension    Wrist ulnar deviation    Wrist radial deviation    Wrist pronation    Wrist supination     (Blank rows = not tested)  Functional shoulder ROM: WFL  UPPER EXTREMITY MMT:  MMT Right eval Left eval  Shoulder flexion 4+ 4+  Shoulder extension    Shoulder abduction 4+ 4+  Shoulder adduction    Shoulder extension    Shoulder internal rotation    Shoulder external rotation    Middle trapezius 3+ 3+  Lower trapezius 3+ 3+  Elbow flexion    Elbow extension    Wrist flexion    Wrist extension    Wrist ulnar deviation    Wrist radial deviation    Wrist pronation    Wrist supination    Grip strength     (Blank rows = not tested)  CERVICAL SPECIAL TESTS:  Neck flexor muscle endurance test: Positive; 20 secs Flexion-rotation test: Positive for cervicogenic headaches  FUNCTIONAL TESTS:  Not indicated  TODAY'S TREATMENT:  DATE:02/09/23 Therex: - Nustep seat 8 UE 11 L3 78mins for scapular re/protraction with cuing for neutral cspine with decent carry over  PT reviewed the following HEP with patient with patient able to demonstrate a set of the following with min cuing for correction needed. PT educated patient on parameters of therex (how/when to inc/decrease intensity, frequency, rep/set range, stretch hold time, and purpose of therex) with verbalized understanding.  Access Code: [email or web address] - Upper Cervical Extension SNAG with Strap  - 1-3 x daily - 7 x weekly - 12-20 reps - 2-5sec hold - Seated Assisted Cervical Rotation  with Towel  - 1-3 x daily - 7 x weekly - 12-20 reps - 2-5sec hold - Sub-Occipital Cervical Stretch  - 1-3 x daily - 7 x weekly - 30-60sec hold - Standing Good Morning with Barbell  - 1-2 x weekly - 2-3 sets - 8-12 reps - Bent Over Single Arm Shoulder Row with Dumbbell  - 1-2 x weekly - 2-3 sets - 8-12 reps - Seated Row Cable Machine  - 1-2 x weekly - 2-3 sets - 8-12 reps - Lat Pull Down  - 1-2 x weekly - 2-3 sets - 8-12 reps    PATIENT EDUCATION:  Education details: Pt was educated on diagnosis, anatomy and pathology involved, prognosis, role of PT, and was given an HEP, demonstrating exercise with proper form and technique following demonstration, VC, and TC, and was given a paper hand out to continue exercise at home. Pt was educated on and agreed to [place]. Person educated: Patient Education method: Explanation, Demonstration, Tactile cues, Verbal cues, and Handouts Education comprehension: verbalized understanding and returned demonstration  HOME EXERCISE PROGRAM: Pt reviewed the following HEP with pt and was able to demonstrate a set of the following with min cuing for correction needed. PT educated pt on parameters of therex (how/when to inc/decrease intensity, frequency, rep/set range, stretch hold time, and purpose of therex) with verbalized understanding.  Cervical retraction mobilization with towel with 3-5 sec holds x 12 (2x/day) Cervical extension mobilization with towel with 3-5 sec holds x 12 (2x/day) Suboccipital release self mobilization with 30 sec hold (2x/day)  ASSESSMENT:  CLINICAL IMPRESSION: PT reassessed goals this session where patient has met all goals to safely d/c formal PT. Patient is able to demonstrate and verbalize understanding of all HEP recommendations with minimal corrections needed. Pt given clinic contact info should further questions or concerns arise. Pt to d/c PT.     OBJECTIVE IMPAIRMENTS: decreased endurance, decreased ROM, decreased strength, and  pain.   ACTIVITY LIMITATIONS: sleeping, bathing, and dressing  PARTICIPATION LIMITATIONS: occupation  PERSONAL FACTORS: 1 comorbidity: osteoporosis  are also affecting patient's functional outcome.   REHAB POTENTIAL: Excellent  CLINICAL DECISION MAKING: Evolving/moderate complexity  EVALUATION COMPLEXITY: Moderate   GOALS: Goals reviewed with patient? Yes  SHORT TERM GOALS: Target date: 02/22/23  Pt will execute HEP independently with proper form and technique in order to return to PLOF at home Baseline: HEP given on 01/25/23; 02/18/23 HEP updated Goal status: revised   LONG TERM GOALS: Target date: 03/22/23  Pt will increase FOTO score to - to demonstrate predicted increase in functional mobility to complete ADLs Baseline: Deferred to next session; 02/18/23 forgot to complete Goal status: INITIAL  2.  Pt will decrease worst pain as reported on NPRS by at least 3 points in order to demonstrate clinically significant reduction in pain. Baseline: NPRS - 10/10; 02/18/23 1/10 Goal status: MET  3.  Pt will demonstrate periscapular strength of  4/5 in order to demonstrate strength needed for ADLs Baseline: 3+ gross; 02/18/23 4/5 Y lower trap 4+/5 mid trap/periscapular musculature Goal status: MET  4.  Pt will complete DNF test of 29 secs to demonstrate a clinically significant increase in strength needed to complete ADLs Baseline: 20 secs; 02/18/23 ceased at 30sec Goal status: MET   PLAN:  PT FREQUENCY: 2x/week  PT DURATION: 8 weeks  PLANNED INTERVENTIONS: Therapeutic exercises, Therapeutic activity, Neuromuscular re-education, Patient/Family education, Self Care, and Manual therapy  PLAN FOR NEXT SESSION: Update HEP.   Durwin Reges DPT Durwin Reges, PT 02/18/2023, 2:56 PM

## 2023-02-23 ENCOUNTER — Ambulatory Visit: Payer: BC Managed Care – PPO | Admitting: Physical Therapy

## 2023-03-01 ENCOUNTER — Encounter: Payer: BC Managed Care – PPO | Admitting: Physical Therapy

## 2023-03-04 ENCOUNTER — Encounter: Payer: BC Managed Care – PPO | Admitting: Physical Therapy

## 2023-03-09 ENCOUNTER — Encounter: Payer: BC Managed Care – PPO | Admitting: Physical Therapy

## 2023-03-11 ENCOUNTER — Encounter: Payer: BC Managed Care – PPO | Admitting: Physical Therapy

## 2023-03-26 ENCOUNTER — Ambulatory Visit
Admission: RE | Admit: 2023-03-26 | Discharge: 2023-03-26 | Disposition: A | Discharge status: Home / Self Care | Payer: BC Managed Care – PPO | Source: Ambulatory Visit | Attending: Family Medicine | Admitting: Family Medicine

## 2023-03-26 DIAGNOSIS — Z780 Asymptomatic menopausal state: Secondary | ICD-10-CM | POA: Diagnosis present

## 2023-03-31 ENCOUNTER — Encounter: Payer: Self-pay | Admitting: Gastroenterology

## 2023-04-02 ENCOUNTER — Ambulatory Visit (INDEPENDENT_AMBULATORY_CARE_PROVIDER_SITE_OTHER): Payer: BC Managed Care – PPO | Admitting: Family Medicine

## 2023-04-02 ENCOUNTER — Encounter: Payer: Self-pay | Admitting: Family Medicine

## 2023-04-02 VITALS — BP 124/80 | HR 91 | Temp 97.8°F | Resp 16 | Ht 62.0 in | Wt 113.6 lb

## 2023-04-02 DIAGNOSIS — M810 Age-related osteoporosis without current pathological fracture: Secondary | ICD-10-CM | POA: Diagnosis not present

## 2023-04-02 DIAGNOSIS — K909 Intestinal malabsorption, unspecified: Secondary | ICD-10-CM | POA: Diagnosis not present

## 2023-04-02 DIAGNOSIS — E559 Vitamin D deficiency, unspecified: Secondary | ICD-10-CM | POA: Diagnosis not present

## 2023-04-02 NOTE — Patient Instructions (Signed)
Health Maintenance  Topic Date Due   COVID-19 Vaccine (4 - 2023-24 season) 04/18/2023*   Zoster (Shingles) Vaccine (1 of 2) 07/03/2023*   Colon Cancer Screening  04/10/2023   Flu Shot  07/01/2023   Mammogram  10/29/2023   DEXA scan (bone density measurement)  03/25/2025   DTaP/Tdap/Td vaccine (2 - Td or Tdap) 05/08/2028   Hepatitis C Screening: USPSTF Recommendation to screen - Ages 5-51 yo.  Completed   HIV Screening  Completed   HPV Vaccine  Aged Out   Pap Smear  Discontinued  *Topic was postponed. The date shown is not the original due date.

## 2023-04-02 NOTE — Progress Notes (Signed)
Patient ID: [name], female    DOB: Oct 12, 1971, 52 y.o.   MRN: [id number]  PCP: Danelle Berry, [name]  Chief Complaint  Patient presents with   Results    Subjective:   Angela Gordon is a 52 y.o. female, presents to clinic with CC of the following:  HPI  Discussion of Dexa results and prior tx and future tx options  Dx of osteoporosis 5+ years ago [facility] 01/30/2016 [facility]  I started seeing pt about 4 years ago -    Patient denies history of fracture.The cause of osteoporosis is felt to be due to postmenopausal estrogen deficiency and hereditary factors. She is currently being treated with calcium and vitamin D supplementation. She is currently being treated with bisphosphonates -patient states that she has been on bisphosphonates for the past 3 years but has not really seen anyone to manage it. She has had changing PCPs here at this clinic over the past several years. She mentioned that she has seen rheumatology before for arthritis and bone health and that was several years ago and he told her that she had the bones of a 52 year old woman. She notes compliance with Fosamax she denies any dysphagia  Oldest documentation I can see in this EMR and care everywhere was Dr. Sherryll Burger Rx fosamax 04/2016 OV with Dr. Sherryll Burger 01/2016: Pt. Is here to discuss the DEXA Scan obtained in March 2014, which showed Osteoporosis in her lumbar spine with a T-score of -3.2. She was told by Dr. Saverio Danker that she should not be on medication because she was 'too young' to receive any therapy.  Assessment & Plan   1. Osteoporosis Based on patient's history and presentation, no contraindication exists for bisphosphonate therapy if she has osteoporosis. We'll repeat DEXA scan and follow-up. - DG Bone Density; Future  Dexa was done 01/2016, f/up with Dr. Sherryll Burger in June 2017 and at that time started fosamax Took that until 2021-2022  Recent dexa:  EXAM: DUAL X-RAY ABSORPTIOMETRY (DXA) FOR BONE  MINERAL DENSITY   IMPRESSION: Your patient Angela Gordon completed a BMD test on 03/26/2023 using the [employer] iDXA DXA System (software version: 14.10) manufactured by [employer]. The following summarizes the results of our evaluation. Technologist: SCE PATIENT BIOGRAPHICAL: Name: Angela, Gordon Patient ID: [id number] Birth Date: 1971/03/21 Height: 61.5 in. Gender: Female Exam Date: 03/26/2023 Weight: 115.6 lbs. Indications: Vitamin D Deficiency, Height Loss, High Risk Meds, Surgical Induced Menopause, Hysterectomy, Osteoporotic, Oophorectomy Bilateral Fractures: finger Treatments: Calcium, Gabapentin, hormonal pellet therapy, Multi-Vitamin, Omeprazole, Vitamin D DENSITOMETRY RESULTS: Site      Region     Measured Date Measured Age WHO Classification Young Adult T-score BMD         %Change vs. Previous Significant Change (*) AP Spine L1-L4 03/26/2023 51.0 Osteopenia -1.3 1.035 g/cm2 4.3% Yes AP Spine L1-L4 11/06/2019 47.6 Osteopenia -1.6 0.992 g/cm2 - -   DualFemur Neck Right 03/26/2023 51.0 Osteopenia -1.3 0.861 g/cm2 -5.4% - DualFemur Neck Right 11/06/2019 47.6 Normal -0.9 0.910 g/cm2 - -   DualFemur Total Mean 03/26/2023 51.0 Normal -0.7 0.919 g/cm2 -1.2% - DualFemur Total Mean 11/06/2019 47.6 Normal -0.6 0.930 g/cm2 - - ASSESSMENT: The BMD measured at Femur Neck Right is 0.861 g/cm2 with a T-score of -1.3. This patient is considered osteopenic according to World Health Organization [facility] criteria. The scan quality is good. Compared with prior study, there has been a significant increase in the spine. Compared with prior study, there has been no  significant change in the total hip.   World Science writer [facility]) criteria for post-menopausal, Caucasian Women: Normal:                   T-score at or above -1 SD Osteopenia/low bone mass: T-score between -1 and -2.5 SD Osteoporosis:             T-score at or below -2.5 SD   RECOMMENDATIONS: 1. All  patients should optimize calcium and vitamin D intake. 2. Consider FDA-approved medical therapies in postmenopausal women and men aged 67 years and older, based on the following: a. A hip or vertebral(clinical or morphometric) fracture b. T-score < -2.5 at the femoral neck or spine after appropriate evaluation to exclude secondary causes c. Low bone mass (T-score between -1.0 and -2.5 at the femoral neck or spine) and a 10-year probability of a hip fracture > 3% or a 10-year probability of a major osteoporosis-related fracture > 20% based on the [place]-adapted WHO algorithm 3. Clinician judgment and/or patient preferences may indicate treatment for people with 10-year fracture probabilities above or below these levels FOLLOW-UP: People with diagnosed cases of osteoporosis or at high risk for fracture should have regular bone mineral density tests. For patients eligible for Medicare, routine testing is allowed once every 2 years. The testing frequency can be increased to one year for patients who have rapidly progressing disease, those who are receiving or discontinuing medical therapy to restore bone mass, or have additional risk factors.   I have reviewed this report, and agree with the above findings.   [facility] Dear Dr Angelica Chessman,   Your patient Angela Gordon completed a FRAX assessment on 03/26/2023 using the [facility] iDXA DXA System (analysis version: 14.10) manufactured by [employer]. The following summarizes the results of our evaluation.   PATIENT BIOGRAPHICAL: Name: Angela, Gordon Patient ID: [id number] Birth Date: May 07, 1971 Height:    61.5 in. Gender:     Female    [age]       Weight:    115.6 lbs. Ethnicity:  Black                            Exam Date: 03/26/2023   FRAX* RESULTS:  (version: 3.5) 10-year Probability of Fracture1 Major Osteoporotic Fracture2 Hip Fracture 1.9% 0.1% Population: [place] (Black) Risk Factors: None   Based  on Femur (Right) Neck BMD   1 -The 10-year probability of fracture may be lower than reported if the patient has received treatment. 2 -Major Osteoporotic Fracture: Clinical Spine, Forearm, Hip or Shoulder   *FRAX is a Armed forces logistics/support/administrative officer of the [employer] for Metabolic Bone Disease, a World Science writer (WHO) [employer].   ASSESSMENT: The probability of a major osteoporotic fracture is 1.9% within the next ten years.   The probability of a hip fracture is 0.1% within the next ten years. .     Electronically Signed   By: Romona Curls M.D.   On: 03/26/2023 09:45  No recent fractures Oldest dexa in chart scanned in spine t score osteoporosis all other dexa since then osteopenia only    Patient Active Problem List   Diagnosis Date Noted   Pure hypercholesterolemia 11/27/2020   Cap polyposis (HCC) 07/27/2020   Unintentional weight loss 07/27/2020   Intestinal malabsorption 05/31/2020   Hypocalcemia 05/31/2020   Anemia 05/31/2020   B12 deficiency 05/31/2020  IBD (inflammatory bowel disease) 05/31/2020   Vitamin D deficiency 05/07/2016   Sickle-cell trait (HCC) 05/07/2016   Osteoporosis, 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      Current Outpatient Medications:    acetaminophen (TYLENOL) 325 MG tablet, Take 325-650 mg by mouth every 6 (six) hours as needed for moderate pain., Disp: , Rfl:    albuterol (VENTOLIN HFA) 108 (90 Base) MCG/ACT inhaler, Inhale 2 puffs into the lungs every 6 (six) hours as needed for wheezing or shortness of breath., Disp: 18 g, Rfl: 2   calcium carbonate (OSCAL) 1500 (600 Ca) MG TABS tablet, Take 600 mg of elemental calcium by mouth 2 (two) times daily., Disp: , Rfl:    Cholecalciferol (VITAMIN D3) 125 MCG (5000 UT) TABS, Take 5,000 Units by mouth every Monday. , Disp: , Rfl:    [employer] CALAMINE PLUS 1-8 % LOTN, APPLY 1 APPLICATION TOPICALLY IN THE MORNING, AT NOON, IN THE EVENING, AND AT BEDTIME., Disp: 118 mL, Rfl: 1    cyanocobalamin 2000 MCG tablet, Take 2,000 mcg by mouth daily., Disp: , Rfl:    dicyclomine (BENTYL) 10 MG capsule, Take 1 capsule (10 mg total) by mouth 2 (two) times daily as needed (abdominal cramps)., Disp: 30 capsule, Rfl: 1   diphenhydrAMINE (BENADRYL) 25 MG tablet, Take 25 mg by mouth daily as needed for allergies., Disp: , Rfl:    gabapentin (NEURONTIN) 300 MG capsule, Take 1 capsule (300 mg total) by mouth 3 (three) times daily., Disp: 30 capsule, Rfl: 0   hydrocortisone (ANUSOL-HC) 25 MG suppository, Place 1 suppository (25 mg total) rectally every 12 (twelve) hours., Disp: 12 suppository, Rfl: 1   hydrocortisone 2.5 % cream, Apply topically 2 (two) times daily., Disp: , Rfl:    levocetirizine (XYZAL) 5 MG tablet, TAKE 1 TABLET BY MOUTH EVERY DAY IN THE EVENING, Disp: 30 tablet, Rfl: 1   meclizine (ANTIVERT) 25 MG tablet, Take 0.5-1 tablets (12.5-25 mg total) by mouth 3 (three) times daily as needed for dizziness., Disp: 30 tablet, Rfl: 0   ondansetron (ZOFRAN) 4 MG tablet, Take 1-1.5 tablets (4-6 mg total) by mouth every 8 (eight) hours as needed for nausea or vomiting., Disp: 20 tablet, Rfl: 1   polyethylene glycol powder (GLYCOLAX/MIRALAX) 17 GM/SCOOP powder, Take 17 g by mouth 2 (two) times daily as needed., Disp: 3350 g, Rfl: 0   promethazine (PHENERGAN) 25 MG tablet, Take 0.5-1 tablets (12.5-25 mg total) by mouth every 6 (six) hours as needed for refractory nausea / vomiting (or with headaches)., Disp: 20 tablet, Rfl: 1   tiZANidine (ZANAFLEX) 4 MG tablet, Take 0.5-1.5 tablets (2-6 mg total) by mouth every 8 (eight) hours as needed for muscle spasms (muscle tightness)., Disp: 90 tablet, Rfl: 0   omeprazole (PRILOSEC) 20 MG capsule, Take 1 capsule (20 mg total) by mouth daily for 30 days, THEN 1 capsule (20 mg total) daily. (Patient taking differently: Take 1 capsule (20 mg total) by mouth daily for 30 days, THEN 1 capsule (20 mg total) daily. Uses as needed), Disp: 90 capsule, Rfl:  3  Current Facility-Administered Medications:    0.9 %  sodium chloride infusion, 500 mL, Intravenous, Once, Mansouraty, Netty Starring., MD   Allergies  Allergen Reactions   Erythromycin Base Other (See Comments)    Several family members have severe side effects, pt has never taken but should avoid    Penicillins Diarrhea and Rash    Did it involve swelling of the face/tongue/throat, SOB, or low BP? Unknown  Did it involve sudden  or severe rash/hives, skin peeling, or any reaction on the inside of your mouth or nose? Unknown  Did you need to seek medical attention at a hospital or doctor's office? Unknown  When did it last happen?    Childhood    If all above answers are "NO", may proceed with cephalosporin use.   Scallops [Shellfish Allergy] Anaphylaxis     Social History   Tobacco Use   Smoking status: Never   Smokeless tobacco: Never  Vaping Use   Vaping Use: Never used  Substance Use Topics   Alcohol use: Yes    Alcohol/week: 1.0 standard drink of alcohol    Types: 1 Standard drinks or equivalent per week    Comment: rarely   Drug use: No      Chart Review Today: I personally reviewed active problem list, medication list, allergies, family history, social history, health maintenance, notes from last encounter, lab results, imaging with the patient/caregiver today.   Review of Systems  Constitutional: Negative.   HENT: Negative.    Eyes: Negative.   Respiratory: Negative.    Cardiovascular: Negative.   Gastrointestinal: Negative.   Endocrine: Negative.   Genitourinary: Negative.   Musculoskeletal: Negative.   Skin: Negative.   Allergic/Immunologic: Negative.   Neurological: Negative.   Hematological: Negative.   Psychiatric/Behavioral: Negative.    All other systems reviewed and are negative.      Objective:   Vitals:   04/02/23 1334  BP: 124/80  Pulse: 91  Resp: 16  Temp: 97.8 F (36.6 C)  TempSrc: Oral  SpO2: 98%  Weight: 113 lb 9.6 oz  (51.5 kg)  Height: 5\' 2"  (1.575 m)    Body mass index is 20.78 kg/m.  Physical Exam Vitals and nursing note reviewed.  Constitutional:      General: She is not in acute distress.    Appearance: Normal appearance. She is well-developed. She is not ill-appearing, toxic-appearing or diaphoretic.  HENT:     Head: Normocephalic and atraumatic.     Nose: Nose normal.  Eyes:     General:        Right eye: No discharge.        Left eye: No discharge.     Conjunctiva/sclera: Conjunctivae normal.  Neck:     Trachea: No tracheal deviation.  Cardiovascular:     Rate and Rhythm: Normal rate and regular rhythm.  Pulmonary:     Effort: Pulmonary effort is normal. No respiratory distress.     Breath sounds: No stridor.  Musculoskeletal:        General: Normal range of motion.  Skin:    General: Skin is warm and dry.     Findings: No rash.  Neurological:     Mental Status: She is alert.     Motor: No abnormal muscle tone.     Coordination: Coordination normal.  Psychiatric:        Behavior: Behavior normal.      Results for orders placed or performed in visit on 12/04/22  CBC with Differential/Platelet  Result Value Ref Range   WBC 6.4 3.8 - 10.8 Thousand/uL   RBC 4.17 3.80 - 5.10 Million/uL   Hemoglobin 13.4 11.7 - 15.5 g/dL   HCT 09.6 04.5 - 40.9 %   MCV 92.1 80.0 - 100.0 fL   MCH 32.1 27.0 - 33.0 pg   MCHC 34.9 32.0 - 36.0 g/dL   RDW 81.1 91.4 - 78.2 %   Platelets 268 140 - 400 Thousand/uL  MPV 10.2 7.5 - 12.5 fL   Neutro Abs 3,744 1,500 - 7,800 cells/uL   Lymphs Abs 1,901 850 - 3,900 cells/uL   Absolute Monocytes 506 200 - 950 cells/uL   Eosinophils Absolute 179 15 - 500 cells/uL   Basophils Absolute 70 0 - 200 cells/uL   Neutrophils Relative % 58.5 %   Total Lymphocyte 29.7 %   Monocytes Relative 7.9 %   Eosinophils Relative 2.8 %   Basophils Relative 1.1 %  COMPLETE METABOLIC PANEL WITH GFR  Result Value Ref Range   Glucose, Bld 87 65 - 99 mg/dL   BUN 13 7 - 25  mg/dL   Creat 4.69 6.29 - 5.28 mg/dL   eGFR 74 > OR = 60 UX/LKG/4.01U2   BUN/Creatinine Ratio SEE NOTE: 6 - 22 (calc)   Sodium 140 135 - 146 mmol/L   Potassium 3.8 3.5 - 5.3 mmol/L   Chloride 106 98 - 110 mmol/L   CO2 24 20 - 32 mmol/L   Calcium 9.0 8.6 - 10.4 mg/dL   Total Protein 6.9 6.1 - 8.1 g/dL   Albumin 4.2 3.6 - 5.1 g/dL   Globulin 2.7 1.9 - 3.7 g/dL (calc)   AG Ratio 1.6 1.0 - 2.5 (calc)   Total Bilirubin 1.1 0.2 - 1.2 mg/dL   Alkaline phosphatase (APISO) 52 37 - 153 U/L   AST 19 10 - 35 U/L   ALT 16 6 - 29 U/L  Lipid panel  Result Value Ref Range   Cholesterol 198 <200 mg/dL   HDL 68 > OR = 50 mg/dL   Triglycerides 67 <725 mg/dL   LDL Cholesterol (Calc) 114 (H) mg/dL (calc)   Total CHOL/HDL Ratio 2.9 <5.0 (calc)   Non-HDL Cholesterol (Calc) 130 (H) <130 mg/dL (calc)  Hemoglobin D6U  Result Value Ref Range   Hgb A1c MFr Bld 5.4 <5.7 % of total Hgb   Mean Plasma Glucose 108 mg/dL   eAG (mmol/L) 6.0 mmol/L       Assessment & Plan:     ICD-10-CM   1. Osteoporosis, post-menopausal  M81.0    reviewed all her Dexa in chart scores stable, no bony pain, no fx, completed nearly 4.5-5 years of fosamax (June 2017 to 2022),  scored improved and have been stable 2020 to 2024, will continue with supplements and dexa q2y Consider endo referral if dexa worsens or fx Onset of osteoporosis at around age 65 - no PTH done that I can see, would want to do that prior to endo referral, but most recent Vit D and calcium for years have been normal    2. Vitamin D deficiency  E55.9    last was done at [facility] 26.4, she has increased supplements    3. Intestinal malabsorption, unspecified type  K90.9   Monitoring labs with [facility] and GI    Reviewed all recent pertinent labs in our system and care everywhere back to 2017 and printed out and reviewed all dexas with pt today She has GI f/up who will recheck vitamins/deficiencies Pt feels good, low risk for falls Agrees to monitoring  with Dexa and endo referral when needed later.  Return for next 3-6 month CPE is overdue.    Danelle Berry, [name] 04/02/23 5:09 PM

## 2023-04-20 ENCOUNTER — Ambulatory Visit (AMBULATORY_SURGERY_CENTER): Payer: BC Managed Care – PPO

## 2023-04-20 VITALS — Ht 62.0 in | Wt 116.0 lb

## 2023-04-20 DIAGNOSIS — K5140 Inflammatory polyps of colon without complications: Secondary | ICD-10-CM

## 2023-04-20 MED ORDER — NA SULFATE-K SULFATE-MG SULF 17.5-3.13-1.6 GM/177ML PO SOLN: 1.0000 | Freq: Once | ORAL | 0 refills | Status: AC

## 2023-04-20 NOTE — Progress Notes (Signed)
Pre visit completed via phone call; Patient verified name, DOB, and address;  No egg or soy allergy known to patient;  No issues known to pt with past sedation with any surgeries or procedures; Patient denies ever being told they had issues or difficulty with intubation;  No FH of Malignant Hyperthermia; Pt is not on diet pills; Pt is not on home 02;  Pt is not on blood thinners;  Pt denies issues with constipation as long as she takes her Miralax; patient advised to increase oral fluids, activity, and fruits/veggies that are allowed;  patient advised that she can also take OTC stool softeners and/or laxatives to prevent constipation the week prior to her procedure;    No A fib or A flutter; Have any cardiac testing pending--NO Pt instructed to use Singlecare.com or GoodRx for a price reduction on prep;   Insurance verified during PV appt=[place]BS [place] with BCBS PPO as secondary;  Patient's chart reviewed by Cathlyn Parsons CNRA prior to previsit and patient appropriate for the [facility].  Previsit completed and red dot placed by patient's name on their procedure day (on provider's schedule).     Continue taking Miralax (at least daily) the 5 days prior to colonoscopy to prevent constipation;   Patient reports she will contact her insurance in regards to how much her portion will cost for the procedure; Patient advised to call back to the office at [phone] should questions/concerns arise;

## 2023-05-04 ENCOUNTER — Other Ambulatory Visit: Payer: Self-pay | Admitting: Family Medicine

## 2023-05-04 DIAGNOSIS — R110 Nausea: Secondary | ICD-10-CM

## 2023-05-04 DIAGNOSIS — R519 Headache, unspecified: Secondary | ICD-10-CM

## 2023-05-11 ENCOUNTER — Other Ambulatory Visit: Payer: Self-pay | Admitting: Family Medicine

## 2023-05-11 DIAGNOSIS — K5904 Chronic idiopathic constipation: Secondary | ICD-10-CM

## 2023-05-11 DIAGNOSIS — K529 Noninfective gastroenteritis and colitis, unspecified: Secondary | ICD-10-CM

## 2023-05-11 MED ORDER — POLYETHYLENE GLYCOL 3350 17 GM/SCOOP PO POWD
17.0000 g | Freq: Two times a day (BID) | ORAL | 0 refills | Status: AC | PRN
Start: 2023-05-11 — End: ?

## 2023-05-14 ENCOUNTER — Ambulatory Visit (AMBULATORY_SURGERY_CENTER): Payer: BC Managed Care – PPO | Admitting: Gastroenterology

## 2023-05-14 ENCOUNTER — Encounter: Payer: Self-pay | Admitting: Gastroenterology

## 2023-05-14 VITALS — BP 107/71 | HR 68 | Temp 97.8°F | Resp 13 | Ht 62.0 in | Wt 116.0 lb

## 2023-05-14 DIAGNOSIS — Z09 Encounter for follow-up examination after completed treatment for conditions other than malignant neoplasm: Secondary | ICD-10-CM | POA: Diagnosis present

## 2023-05-14 DIAGNOSIS — K6289 Other specified diseases of anus and rectum: Secondary | ICD-10-CM

## 2023-05-14 DIAGNOSIS — K5140 Inflammatory polyps of colon without complications: Secondary | ICD-10-CM | POA: Diagnosis not present

## 2023-05-14 DIAGNOSIS — K621 Rectal polyp: Secondary | ICD-10-CM | POA: Diagnosis not present

## 2023-05-14 MED ORDER — SODIUM CHLORIDE 0.9 % IV SOLN: 500.0000 mL | Freq: Once | INTRAVENOUS | Status: DC

## 2023-05-14 NOTE — Progress Notes (Signed)
GASTROENTEROLOGY PROCEDURE H&P NOTE   Primary Care Physician: Danelle Berry, [name]  HPI: Angela Gordon is a 52 y.o. female who presents for Colonoscopy for follow-up of CAP polyposis, status post previous resection and subsequent ESD with just granulation inflammatory tissue.  Past Medical History:  Diagnosis Date   Anemia    Asthma    Uses rescue inhaler occasionally.   Complication of anesthesia    slow to wake up   Family history of adverse reaction to anesthesia    Sister- Cardiac arrest during emergency c-section   Family history of ovarian cancer 07/26/2017   Headache    occasional - otc med prn   Heart murmur    was told she had MVP when she was around 24 years, she states she never followed up with that and has never had any issues from it   History of blood transfusion 1992   Blood Loss from Toxemia of Pregnancy   Hypoglycemia    Osteoporosis    Strong FHx of Osteoporosis.   Osteoporosis 10/06/2019   PONV (postoperative nausea and vomiting)    Rectal polyp 01/11/2020   Scoliosis    Seen Dr. Gavin Potters   Shingles    Sickle-cell trait [facility])    Stroke [facility]) 1992   Toxemia of Pregnancy    Past Surgical History:  Procedure Laterality Date   ABDOMINAL HYSTERECTOMY  2010   total -Menopausal symptoms, takes HRT   BIOPSY  02/26/2020   Procedure: BIOPSY;  Surgeon: Lemar Lofty., MD;  Location: [facility] ENDOSCOPY;  Service: Gastroenterology;;   BIOPSY  08/19/2020   Procedure: BIOPSY;  Surgeon: Lemar Lofty., MD;  Location: [facility] ENDOSCOPY;  Service: Gastroenterology;;   BREAST EXCISIONAL BIOPSY Left 2003   neg bx dr Evette Cristal   BREAST SURGERY Left 2004   biopsy, benign   COLONOSCOPY N/A 08/19/2020   Procedure: COLONOSCOPY;  Surgeon: Lemar Lofty., MD;  Location: [facility] ENDOSCOPY;  Service: Gastroenterology;  Laterality: N/A;   COLONOSCOPY  2022   GM-MAC-prep adequate-int ext hems/2 yr recall   COLONOSCOPY W/ BIOPSIES  05/09/2020   [facility] - removed a  "Cap Polyp" per patient   COLONOSCOPY WITH PROPOFOL N/A 11/29/2019   Procedure: COLONOSCOPY WITH PROPOFOL;  Surgeon: Pasty Spillers, MD;  Location: [facility] ENDOSCOPY;  Service: Endoscopy;  Laterality: N/A;   COLONOSCOPY WITH PROPOFOL N/A 02/26/2020   Procedure: COLONOSCOPY WITH PROPOFOL;  Surgeon: Meridee Score Netty Starring., MD;  Location: [facility] ENDOSCOPY;  Service: Gastroenterology;  Laterality: N/A;   CYST REMOVAL HAND Right 2005-06   ESOPHAGOGASTRODUODENOSCOPY (EGD) WITH PROPOFOL N/A 08/19/2020   Procedure: ESOPHAGOGASTRODUODENOSCOPY (EGD) WITH PROPOFOL;  Surgeon: Meridee Score Netty Starring., MD;  Location: [facility] ENDOSCOPY;  Service: Gastroenterology;  Laterality: N/A;   EUS  02/26/2020   Procedure: LOWER ENDOSCOPIC ULTRASOUND (EUS);  Surgeon: Lemar Lofty., MD;  Location: [facility] ENDOSCOPY;  Service: Gastroenterology;;   Wenda Low SIGMOIDOSCOPY  05/17/20, 05/19/20   x 2 at [facility] [place] Right 10/16/2020   Procedure: DOUBLE OSTEOTOMY RIGHT;  Surgeon: Gwyneth Revels, DPM;  Location: [facility];  Service: Podiatry;  Laterality: Right;   HERNIA REPAIR  2004   umbilical   TONSILLECTOMY  1978   Current Outpatient Medications  Medication Sig Dispense Refill   acetaminophen (TYLENOL) 325 MG tablet Take 325-650 mg by mouth every 6 (six) hours as needed for moderate pain.     albuterol (VENTOLIN HFA) 108 (90 Base) MCG/ACT inhaler Inhale 2 puffs into the lungs every 6 (six) hours  as needed for wheezing or shortness of breath. 18 g 2   calcium carbonate (OSCAL) 1500 (600 Ca) MG TABS tablet Take 600 mg of elemental calcium by mouth 2 (two) times daily.     Cholecalciferol (VITAMIN D3) 125 MCG (5000 UT) TABS Take 5,000 Units by mouth every Monday.      cyanocobalamin 2000 MCG tablet Take 2,000 mcg by mouth daily.     dicyclomine (BENTYL) 10 MG capsule Take 1 capsule (10 mg total) by mouth 2 (two) times daily as needed (abdominal cramps). 30 capsule 1   diphenhydrAMINE  (BENADRYL) 25 MG tablet Take 25 mg by mouth daily as needed for allergies.     hydrocortisone 2.5 % cream Apply topically 2 (two) times daily as needed.     levocetirizine (XYZAL) 5 MG tablet TAKE 1 TABLET BY MOUTH EVERY DAY IN THE EVENING 30 tablet 1   meclizine (ANTIVERT) 25 MG tablet Take 0.5-1 tablets (12.5-25 mg total) by mouth 3 (three) times daily as needed for dizziness. 30 tablet 0   omeprazole (PRILOSEC) 20 MG capsule Take 1 capsule (20 mg total) by mouth daily for 30 days, THEN 1 capsule (20 mg total) daily. (Patient taking differently: Take 1 capsule (20 mg total) by mouth daily for 30 days, THEN 1 capsule (20 mg total) daily. Uses as needed) 90 capsule 3   ondansetron (ZOFRAN) 4 MG tablet Take 1-1.5 tablets (4-6 mg total) by mouth every 8 (eight) hours as needed for nausea or vomiting. 20 tablet 1   polyethylene glycol powder (GLYCOLAX/MIRALAX) 17 GM/SCOOP powder Take 17 g by mouth 2 (two) times daily as needed. 3350 g 0   promethazine (PHENERGAN) 25 MG tablet TAKE 1/2 TO 1 TAB BY MOUTH EVERY 6 HOURS AS NEEDED FOR REFRACTORY NAUSEA/VOMITING (OR HEADACHES). 10 tablet 0   tiZANidine (ZANAFLEX) 4 MG tablet Take 0.5-1.5 tablets (2-6 mg total) by mouth every 8 (eight) hours as needed for muscle spasms (muscle tightness). 90 tablet 0   Current Facility-Administered Medications  Medication Dose Route Frequency Provider Last Rate Last Admin   0.9 %  sodium chloride infusion  500 mL Intravenous Once Mansouraty, Netty Starring., MD       0.9 %  sodium chloride infusion  500 mL Intravenous Once Mansouraty, Netty Starring., MD        Current Outpatient Medications:    acetaminophen (TYLENOL) 325 MG tablet, Take 325-650 mg by mouth every 6 (six) hours as needed for moderate pain., Disp: , Rfl:    albuterol (VENTOLIN HFA) 108 (90 Base) MCG/ACT inhaler, Inhale 2 puffs into the lungs every 6 (six) hours as needed for wheezing or shortness of breath., Disp: 18 g, Rfl: 2   calcium carbonate (OSCAL) 1500 (600  Ca) MG TABS tablet, Take 600 mg of elemental calcium by mouth 2 (two) times daily., Disp: , Rfl:    Cholecalciferol (VITAMIN D3) 125 MCG (5000 UT) TABS, Take 5,000 Units by mouth every Monday. , Disp: , Rfl:    cyanocobalamin 2000 MCG tablet, Take 2,000 mcg by mouth daily., Disp: , Rfl:    dicyclomine (BENTYL) 10 MG capsule, Take 1 capsule (10 mg total) by mouth 2 (two) times daily as needed (abdominal cramps)., Disp: 30 capsule, Rfl: 1   diphenhydrAMINE (BENADRYL) 25 MG tablet, Take 25 mg by mouth daily as needed for allergies., Disp: , Rfl:    hydrocortisone 2.5 % cream, Apply topically 2 (two) times daily as needed., Disp: , Rfl:    levocetirizine (XYZAL) 5 MG  tablet, TAKE 1 TABLET BY MOUTH EVERY DAY IN THE EVENING, Disp: 30 tablet, Rfl: 1   meclizine (ANTIVERT) 25 MG tablet, Take 0.5-1 tablets (12.5-25 mg total) by mouth 3 (three) times daily as needed for dizziness., Disp: 30 tablet, Rfl: 0   omeprazole (PRILOSEC) 20 MG capsule, Take 1 capsule (20 mg total) by mouth daily for 30 days, THEN 1 capsule (20 mg total) daily. (Patient taking differently: Take 1 capsule (20 mg total) by mouth daily for 30 days, THEN 1 capsule (20 mg total) daily. Uses as needed), Disp: 90 capsule, Rfl: 3   ondansetron (ZOFRAN) 4 MG tablet, Take 1-1.5 tablets (4-6 mg total) by mouth every 8 (eight) hours as needed for nausea or vomiting., Disp: 20 tablet, Rfl: 1   polyethylene glycol powder (GLYCOLAX/MIRALAX) 17 GM/SCOOP powder, Take 17 g by mouth 2 (two) times daily as needed., Disp: 3350 g, Rfl: 0   promethazine (PHENERGAN) 25 MG tablet, TAKE 1/2 TO 1 TAB BY MOUTH EVERY 6 HOURS AS NEEDED FOR REFRACTORY NAUSEA/VOMITING (OR HEADACHES)., Disp: 10 tablet, Rfl: 0   tiZANidine (ZANAFLEX) 4 MG tablet, Take 0.5-1.5 tablets (2-6 mg total) by mouth every 8 (eight) hours as needed for muscle spasms (muscle tightness)., Disp: 90 tablet, Rfl: 0  Current Facility-Administered Medications:    0.9 %  sodium chloride infusion, 500 mL,  Intravenous, Once, Mansouraty, Netty Starring., MD   0.9 %  sodium chloride infusion, 500 mL, Intravenous, Once, Mansouraty, Netty Starring., MD Allergies  Allergen Reactions   Erythromycin Base Other (See Comments)    Several family members have severe side effects, pt has never taken but should avoid    Penicillins Diarrhea and Rash    Did it involve swelling of the face/tongue/throat, SOB, or low BP? Unknown  Did it involve sudden or severe rash/hives, skin peeling, or any reaction on the inside of your mouth or nose? Unknown  Did you need to seek medical attention at a hospital or doctor's office? Unknown  When did it last happen?    Childhood    If all above answers are "NO", may proceed with cephalosporin use.   Shellfish Allergy Anaphylaxis   Family History  Problem Relation Age of Onset   Hypertension Mother    Osteoporosis Mother    Cervical cancer Mother        Hysterectomy in 29s.   Stroke Mother    Sickle cell trait Father    Sickle cell trait Sister    Heart disease Maternal Aunt    Breast cancer Maternal Aunt        2 mat aunts   Heart disease Maternal Uncle    Hypertension Maternal Uncle    Ovarian cancer Paternal Aunt 4   Breast cancer Paternal Aunt        pat great aunt   Heart disease Maternal Grandmother    Hypertension Maternal Grandmother    Osteoporosis Maternal Grandmother    Rheum arthritis Maternal Grandfather    Hypertension Maternal Grandfather    Prostate cancer Maternal Grandfather    Leukemia Cousin    Colon cancer Neg Hx    Esophageal cancer Neg Hx    Pancreatic cancer Neg Hx    Stomach cancer Neg Hx    Inflammatory bowel disease Neg Hx    Liver disease Neg Hx    Rectal cancer Neg Hx    Colon polyps Neg Hx    Social History   Socioeconomic History   Marital status: Married    Spouse  name: julius   Number of children: 3   Years of education: Not on file   Highest education level: Not on file  Occupational History   Not on file   Tobacco Use   Smoking status: Never   Smokeless tobacco: Never  Vaping Use   Vaping Use: Never used  Substance and Sexual Activity   Alcohol use: Yes    Alcohol/week: 1.0 standard drink of alcohol    Types: 1 Standard drinks or equivalent per week    Comment: rarely   Drug use: No   Sexual activity: Yes    Birth control/protection: Surgical    Comment: Hysterectomy  Other Topics Concern   Not on file  Social History Narrative   Not on file   Social Determinants of Health   Financial Resource Strain: Low Risk  (12/04/2022)   Overall Financial Resource Strain (CARDIA)    Difficulty of Paying Living Expenses: Not hard at all  Food Insecurity: No Food Insecurity (12/04/2022)   Hunger Vital Sign    Worried About Running Out of Food in the Last Year: Never true    Ran Out of Food in the Last Year: Never true  Transportation Needs: No Transportation Needs (12/03/2021)   PRAPARE - Administrator, Civil Service (Medical): No    Lack of Transportation (Non-Medical): No  Physical Activity: Insufficiently Active (12/03/2021)   Exercise Vital Sign    Days of Exercise per Week: 1 day    Minutes of Exercise per Session: 10 min  Stress: No Stress Concern Present (12/04/2022)   [employer] of Occupational Health - Occupational Stress Questionnaire    Feeling of Stress : Only a little  Social Connections: Socially Integrated (12/04/2022)   Social Connection and Isolation Panel [NHANES]    Frequency of Communication with Friends and Family: More than three times a week    Frequency of Social Gatherings with Friends and Family: More than three times a week    Attends Religious Services: 1 to 4 times per year    Active Member of [employer] or Organizations: No    Attends Engineer, structural: More than 4 times per year    Marital Status: Married  Catering manager Violence: Not At Risk (12/04/2022)   Humiliation, Afraid, Rape, and Kick questionnaire    Fear of Current or  Ex-Partner: No    Emotionally Abused: No    Physically Abused: No    Sexually Abused: No    Physical Exam: Today's Vitals   05/14/23 1319  BP: (!) 140/84  Pulse: 82  Temp: 97.8 F (36.6 C)  TempSrc: Temporal  SpO2: 99%  Weight: 116 lb (52.6 kg)  Height: 5\' 2"  (1.575 m)   Body mass index is 21.22 kg/m. GEN: NAD EYE: Sclerae anicteric ENT: MMM CV: Non-tachycardic GI: Soft, NT/ND NEURO:  Alert & Oriented x 3  Lab Results: No results for input(s): "WBC", "HGB", "HCT", "PLT" in the last 72 hours. BMET No results for input(s): "NA", "K", "CL", "CO2", "GLUCOSE", "BUN", "CREATININE", "CALCIUM" in the last 72 hours. LFT No results for input(s): "PROT", "ALBUMIN", "AST", "ALT", "ALKPHOS", "BILITOT", "BILIDIR", "IBILI" in the last 72 hours. PT/INR No results for input(s): "LABPROT", "INR" in the last 72 hours.   Impression / Plan: This is a 52 y.o.female who presents for Colonoscopy for follow-up of CAP polyposis, status post previous resection and subsequent ESD with just granulation inflammatory tissue.  The risks and benefits of endoscopic evaluation/treatment were discussed with the patient  and/or family; these include but are not limited to the risk of perforation, infection, bleeding, missed lesions, lack of diagnosis, severe illness requiring hospitalization, as well as anesthesia and sedation related illnesses.  The patient's history has been reviewed, patient examined, no change in status, and deemed stable for procedure.  The patient and/or family is agreeable to proceed.    Corliss Parish, MD [facility] Advanced [facility]oscopy Office # [phone]

## 2023-05-14 NOTE — Op Note (Signed)
[facility] Patient Name: Angela Gordon Procedure Date: 05/14/2023 2:01 PM MRN: [id number] Endoscopist: Corliss Parish , MD, [id number] Age: 52 Referring MD:  Date of Birth: 06/20/1971 Gender: Female Account #: [id number] Procedure:                Colonoscopy Indications:              High risk colon cancer surveillance: Personal                            history of colonic polyps (history of CAP polyposis                            status post prior EMR and subsequent ESD) Medicines:                Monitored Anesthesia Care Procedure:                Pre-Anesthesia Assessment:                           - Prior to the procedure, a History and Physical                            was performed, and patient medications and                            allergies were reviewed. The patient's tolerance of                            previous anesthesia was also reviewed. The risks                            and benefits of the procedure and the sedation                            options and risks were discussed with the patient.                            All questions were answered, and informed consent                            was obtained. Prior Anticoagulants: The patient has                            taken no anticoagulant or antiplatelet agents. ASA                            Grade Assessment: II - A patient with mild systemic                            disease. After reviewing the risks and benefits,                            the patient was deemed in satisfactory condition to  undergo the procedure.                           After obtaining informed consent, the colonoscope                            was passed under direct vision. Throughout the                            procedure, the patient's blood pressure, pulse, and                            oxygen saturations were monitored continuously. The                            Olympus  PCF-H190DL (#[id number]) Colonoscope was                            introduced through the anus and advanced to the 3                            cm into the ileum. The colonoscopy was performed                            without difficulty. The patient tolerated the                            procedure. The quality of the bowel preparation was                            good. The terminal ileum, ileocecal valve,                            appendiceal orifice, and rectum were photographed. Scope In: 2:15:13 PM Scope Out: 2:28:01 PM Scope Withdrawal Time: 0 hours 11 minutes 0 seconds  Total Procedure Duration: 0 hours 12 minutes 48 seconds  Findings:                 The digital rectal exam findings include                            hemorrhoids. Pertinent negatives include no                            palpable rectal lesions.                           The terminal ileum and ileocecal valve appeared                            normal.                           Localized severe mucosal changes characterized by  congestion (edema), erythema, granularity were                            found in the middle/distal rectum at previous                            EMR/ESD resection site. This area has more inflamed                            appearance than 2 years ago. Biopsies were taken                            with a cold forceps for histology due to the                            history of CAP polyposis.                           Normal mucosa was found in the entire colon                            otherwise.                           Non-bleeding non-thrombosed external and internal                            hemorrhoids were found during retroflexion and                            during digital exam. The hemorrhoids were Grade I                            (internal hemorrhoids that do not prolapse). Complications:            No immediate complications. Estimated  Blood Loss:     Estimated blood loss was minimal. Impression:               - Hemorrhoids found on digital rectal exam.                           - The examined portion of the ileum was normal.                           - Localized severe mucosal changes were found in                            the middle/distal rectum at previous EMR/ESD                            resection site. Biopsied due to previous history of                            CAP polyposis.                           -  Normal mucosa in the entire examined colon                            otherwise.                           - Non-bleeding non-thrombosed external and internal                            hemorrhoids. Recommendation:           - The patient will be observed post-procedure,                            until all discharge criteria are met.                           - Discharge patient to home.                           - Patient has a contact number available for                            emergencies. The signs and symptoms of potential                            delayed complications were discussed with the                            patient. Return to normal activities tomorrow.                            Written discharge instructions were provided to the                            patient.                           - High fiber diet.                           - Use FiberCon 1-2 tablets PO daily.                           - Continue present medications.                           - Await pathology results.                           - Repeat colonoscopy for surveillance based on                            pathology results.                           - If evidence of recurrent polyposis, CAP will need  to consider colorectal surgery evaluation for                            transanal excision attempt. May consider                            cross-sectional imaging. If this remains                             inflammatory in nature, then consider the role of                            Canasa suppositories as if we were treating                            potential proctitis. Thankfully, patient states she                            has not had any recurrent rectal bleeding for over                            2 years.                           - The findings and recommendations were discussed                            with the patient.                           - The findings and recommendations were discussed                            with the patient's family. Corliss Parish, MD 05/14/2023 2:37:29 PM

## 2023-05-14 NOTE — Progress Notes (Signed)
PT taken to PACU. Monitors in place. VSS. Report given to RN. 

## 2023-05-14 NOTE — Patient Instructions (Signed)
Thank you for coming in to see Angela Gordon today. Resume previous diet and mediations today. Return to regular daily activities tomorrow. Follow high fiber diet. Use FiberCon 1-2 tablet daily. Await pathology results, 1-2 weeks, at which time Dr Meridee Score will make his recommendations.    YOU HAD AN ENDOSCOPIC PROCEDURE TODAY AT THE [facility]:   Refer to the procedure report that was given to you for any specific questions about what was found during the examination.  If the procedure report does not answer your questions, please call your gastroenterologist to clarify.  If you requested that your care partner not be given the details of your procedure findings, then the procedure report has been included in a sealed envelope for you to review at your convenience later.  YOU SHOULD EXPECT: Some feelings of bloating in the abdomen. Passage of more gas than usual.  Walking can help get rid of the air that was put into your GI tract during the procedure and reduce the bloating. If you had a lower endoscopy (such as a colonoscopy or flexible sigmoidoscopy) you may notice spotting of blood in your stool or on the toilet paper. If you underwent a bowel prep for your procedure, you may not have a normal bowel movement for a few days.  Please Note:  You might notice some irritation and congestion in your nose or some drainage.  This is from the oxygen used during your procedure.  There is no need for concern and it should clear up in a day or so.  SYMPTOMS TO REPORT IMMEDIATELY:  Following lower endoscopy (colonoscopy or flexible sigmoidoscopy):  Excessive amounts of blood in the stool  Significant tenderness or worsening of abdominal pains  Swelling of the abdomen that is new, acute  Fever of 100F or higher   For urgent or emergent issues, a gastroenterologist can be reached at any hour by calling [phone]. Do not use MyChart messaging for urgent concerns.    DIET:  We do recommend  a small meal at first, but then you may proceed to your regular diet.  Drink plenty of fluids but you should avoid alcoholic beverages for 24 hours.  ACTIVITY:  You should plan to take it easy for the rest of today and you should NOT DRIVE or use heavy machinery until tomorrow (because of the sedation medicines used during the test).    FOLLOW UP: Our staff will call the number listed on your records the next business day following your procedure.  We will call around 7:15- 8:00 am to check on you and address any questions or concerns that you may have regarding the information given to you following your procedure. If we do not reach you, we will leave a message.     If any biopsies were taken you will be contacted by phone or by letter within the next 1-3 weeks.  Please call Angela Gordon at [phone] if you have not heard about the biopsies in 3 weeks.    SIGNATURES/CONFIDENTIALITY: You and/or your care partner have signed paperwork which will be entered into your electronic medical record.  These signatures attest to the fact that that the information above on your After Visit Summary has been reviewed and is understood.  Full responsibility of the confidentiality of this discharge information lies with you and/or your care-partner.

## 2023-05-14 NOTE — Progress Notes (Signed)
VS completed by CW   Pt's states no medical or surgical changes since previsit or office visit.  

## 2023-05-14 NOTE — Progress Notes (Signed)
Called to room to assist during endoscopic procedure.  Patient ID and intended procedure confirmed with present staff. Received instructions for my participation in the procedure from the performing physician.  

## 2023-05-17 ENCOUNTER — Telehealth: Payer: Self-pay | Admitting: *Deleted

## 2023-05-17 NOTE — Telephone Encounter (Signed)
  Follow up Call-     05/14/2023    1:19 PM 04/09/2021   12:42 PM  Call back number  Post procedure Call Back phone  # [phone]  Permission to leave phone message Yes Yes     Patient questions:  Do you have a fever, pain , or abdominal swelling? No. Pain Score  0 *  Have you tolerated food without any problems? Yes.    Have you been able to return to your normal activities? Yes.    Do you have any questions about your discharge instructions: Diet   No. Medications  No. Follow up visit  No.  Do you have questions or concerns about your Care? No.  Actions: * If pain score is 4 or above: No action needed, pain <4.

## 2023-05-21 ENCOUNTER — Encounter: Payer: Self-pay | Admitting: Gastroenterology

## 2023-05-21 ENCOUNTER — Other Ambulatory Visit: Payer: Self-pay

## 2023-05-21 ENCOUNTER — Telehealth: Payer: Self-pay | Admitting: Gastroenterology

## 2023-05-21 MED ORDER — MESALAMINE 1000 MG RE SUPP: 1000.0000 mg | Freq: Every day | RECTAL | 6 refills | Status: AC

## 2023-05-21 NOTE — Telephone Encounter (Signed)
PT was prescribed mesalamine suppositories and the copay for them is $141.00 and she is seeking an alternative. Requesting callback

## 2023-05-24 NOTE — Telephone Encounter (Signed)
The pt has been provided the information from [employer] for coupon for $54 for supps.  She will try that and let us kno wif this does not work.

## 2023-08-16 ENCOUNTER — Other Ambulatory Visit: Payer: Self-pay | Admitting: Family Medicine

## 2023-08-16 DIAGNOSIS — Z1231 Encounter for screening mammogram for malignant neoplasm of breast: Secondary | ICD-10-CM

## 2023-09-01 ENCOUNTER — Ambulatory Visit
Admission: RE | Admit: 2023-09-01 | Discharge: 2023-09-01 | Disposition: A | Discharge status: Home / Self Care | Payer: BC Managed Care – PPO | Source: Ambulatory Visit | Attending: Family Medicine | Admitting: Family Medicine

## 2023-09-01 DIAGNOSIS — Z1231 Encounter for screening mammogram for malignant neoplasm of breast: Secondary | ICD-10-CM

## 2023-10-29 ENCOUNTER — Other Ambulatory Visit: Payer: Self-pay | Admitting: Family Medicine

## 2023-10-29 DIAGNOSIS — R110 Nausea: Secondary | ICD-10-CM

## 2023-10-29 DIAGNOSIS — R519 Headache, unspecified: Secondary | ICD-10-CM

## 2023-11-02 MED ORDER — PROMETHAZINE HCL 25 MG PO TABS
25.0000 mg | ORAL_TABLET | ORAL | 0 refills | Status: DC | PRN
Start: 2023-11-02 — End: 2024-05-01

## 2023-12-07 ENCOUNTER — Encounter: Payer: BC Managed Care – PPO | Admitting: Physician Assistant

## 2023-12-11 ENCOUNTER — Encounter: Payer: Self-pay | Admitting: Family Medicine

## 2024-01-03 ENCOUNTER — Telehealth: Payer: Self-pay | Admitting: Gastroenterology

## 2024-01-03 NOTE — Telephone Encounter (Signed)
Patient calling to scheduled egd. Please advise.

## 2024-01-04 NOTE — Telephone Encounter (Signed)
Dr Meridee Score there is a endo flex recall entered but the pt states she only needs flex. Can you please review and advise what she needs?

## 2024-01-05 NOTE — Telephone Encounter (Signed)
 If patient is not having progressive GERD symptoms/Dysphagia symptoms/UGI symptoms, then reasonable to not need EGD and only Flexible sigmoidoscopy. If above symptoms present then EGD/Flex can be pursued. Thanks. GM

## 2024-01-05 NOTE — Telephone Encounter (Signed)
 The pt states that she has no upper esophageal complaints at this time.  She prefers to only proceed with Flex.  She will call and set up the flex as she is able.

## 2024-02-04 ENCOUNTER — Ambulatory Visit: Payer: BC Managed Care – PPO | Admitting: *Deleted

## 2024-02-04 VITALS — Ht 62.0 in | Wt 105.0 lb

## 2024-02-04 DIAGNOSIS — K5140 Inflammatory polyps of colon without complications: Secondary | ICD-10-CM

## 2024-02-04 NOTE — Progress Notes (Signed)
Pre visit completed over telephone.  Instructions forwarded through MyChart.    No egg or soy allergy known to patient  No issues known to pt with past sedation with any surgeries or procedures Patient denies ever being told they had issues or difficulty with intubation  No FH of Malignant Hyperthermia Pt is not on diet pills Pt is not on  home 02  Pt is not on blood thinners  Pt denies issues with constipation  No A fib or A flutter Have any cardiac testing pending-NO Pt instructed to use [email or web address] or [employer] for a price reduction on prep   

## 2024-02-22 ENCOUNTER — Encounter: Payer: Self-pay | Admitting: Gastroenterology

## 2024-02-25 ENCOUNTER — Ambulatory Visit (AMBULATORY_SURGERY_CENTER): Payer: BC Managed Care – PPO | Admitting: Gastroenterology

## 2024-02-25 ENCOUNTER — Encounter: Payer: Self-pay | Admitting: Gastroenterology

## 2024-02-25 VITALS — BP 110/76 | HR 66 | Temp 98.1°F | Resp 11 | Ht 62.0 in | Wt 105.0 lb

## 2024-02-25 DIAGNOSIS — K5140 Inflammatory polyps of colon without complications: Secondary | ICD-10-CM

## 2024-02-25 DIAGNOSIS — K6289 Other specified diseases of anus and rectum: Secondary | ICD-10-CM | POA: Insufficient documentation

## 2024-02-25 DIAGNOSIS — Z0389 Encounter for observation for other suspected diseases and conditions ruled out: Secondary | ICD-10-CM | POA: Diagnosis not present

## 2024-02-25 DIAGNOSIS — K640 First degree hemorrhoids: Secondary | ICD-10-CM

## 2024-02-25 DIAGNOSIS — Z1211 Encounter for screening for malignant neoplasm of colon: Secondary | ICD-10-CM | POA: Diagnosis present

## 2024-02-25 DIAGNOSIS — Z9889 Other specified postprocedural states: Secondary | ICD-10-CM

## 2024-02-25 DIAGNOSIS — K644 Residual hemorrhoidal skin tags: Secondary | ICD-10-CM

## 2024-02-25 MED ORDER — SODIUM CHLORIDE 0.9 % IV SOLN
500.0000 mL | Freq: Once | INTRAVENOUS | Status: DC
Start: 2024-02-25 — End: 2024-02-25

## 2024-02-25 NOTE — Op Note (Signed)
 [facility] Patient Name: Angela Gordon Procedure Date: 02/25/2024 8:48 AM MRN: [id number] Endoscopist: Corliss Parish , MD, [id number] Age: 53 Referring MD:  Date of Birth: [birth year] Gender: Female Account #: [id number] Procedure:                Flexible Sigmoidoscopy Indications:              High risk colon cancer surveillance: Personal                            history of colonic polyps; History of CAP Polyposis                            (s/p prior EMR then ESD with persistent                            inflammatory change but no recurrence of CAP -                            recent administration of Canasa for 2 months here 3                            months post completion of therapy) Medicines:                Monitored Anesthesia Care Procedure:                Pre-Anesthesia Assessment:                           - Prior to the procedure, a History and Physical                            was performed, and patient medications and                            allergies were reviewed. The patient's tolerance of                            previous anesthesia was also reviewed. The risks                            and benefits of the procedure and the sedation                            options and risks were discussed with the patient.                            All questions were answered, and informed consent                            was obtained. Prior Anticoagulants: The patient has                            taken no anticoagulant or antiplatelet agents. ASA  Grade Assessment: II - A patient with mild systemic                            disease. After reviewing the risks and benefits,                            the patient was deemed in satisfactory condition to                            undergo the procedure.                           After obtaining informed consent, the scope was                            passed under direct  vision. The GIF [id number] #[id number]                            was introduced through the anus and advanced to the                            the left transverse colon. The flexible                            sigmoidoscopy was accomplished without difficulty.                            The patient tolerated the procedure. The quality of                            the bowel preparation was adequate. Scope In: 8:56:29 AM Scope Out: 9:07:29 AM Total Procedure Duration: 0 hours 11 minutes 0 seconds  Findings:                 Skin tags were found on perianal exam.                           The digital rectal exam findings include                            hemorrhoids. Pertinent negatives include no                            palpable rectal lesions.                           Normal mucosa was found in the left colon. Biopsies                            were taken with a cold forceps for histology                            purposes to rule out chronic colitis.  A large post mucosectomy scar was found in the mid                            rectum. Adjacent mucosal findings include                            congestion, edema, friability and inflammation.                            Biopsies were taken with a cold forceps for                            histology.                           Non-bleeding non-thrombosed external and internal                            hemorrhoids were found during retroflexion, during                            perianal exam and during digital exam. The                            hemorrhoids were Grade I (internal hemorrhoids that                            do not prolapse). Complications:            No immediate complications. Estimated Blood Loss:     Estimated blood loss was minimal. Impression:               - Perianal skin tags found on perianal exam.                            Hemorrhoids found on digital rectal exam.                            - Normal mucosa in the left colon. Biopsied.                           - Post mucosectomy scar in the mid rectum with                            persisting inflammation appearance (slightly less                            inflammed this year). Biopsied.                           - Non-bleeding non-thrombosed external and internal                            hemorrhoids. Recommendation:           - The patient will be observed post-procedure,  until all discharge criteria are met.                           - Discharge patient to home.                           - Patient has a contact number available for                            emergencies. The signs and symptoms of potential                            delayed complications were discussed with the                            patient. Return to normal activities tomorrow.                            Written discharge instructions were provided to the                            patient.                           - Resume previous diet.                           - Await pathology results.                           - Repeat Colonoscopy in 2 years for surveillance                            pending final pathology.                           - If unintentional weight loss progresses then                            consider role of EGD and cross-sectional imaging                            (hopefully only a result of recent family stressor).                           - The findings and recommendations were discussed                            with the patient.                           - The findings and recommendations were discussed                            with the patient's family. Corliss Parish, MD 02/25/2024 9:15:02 AM

## 2024-02-25 NOTE — Progress Notes (Signed)
 Called to room to assist during endoscopic procedure.  Patient ID and intended procedure confirmed with present staff. Received instructions for my participation in the procedure from the performing physician.

## 2024-02-25 NOTE — Progress Notes (Signed)
 Sedate, gd SR, tolerated procedure well, VSS, report to RN

## 2024-02-25 NOTE — Progress Notes (Signed)
 Pt has had a weight loss of 15 lbs recently

## 2024-02-25 NOTE — Patient Instructions (Signed)
 Resume previous diet Continue present medications Await pathology results Repeat colonoscopy in 2 years Report any further weight loss  Handouts/information given for  hemorrhoids  YOU HAD AN ENDOSCOPIC PROCEDURE TODAY AT THE  ENDOSCOPY CENTER:   Refer to the procedure report that was given to you for any specific questions about what was found during the examination.  If the procedure report does not answer your questions, please call your gastroenterologist to clarify.  If you requested that your care partner not be given the details of your procedure findings, then the procedure report has been included in a sealed envelope for you to review at your convenience later.  YOU SHOULD EXPECT: Some feelings of bloating in the abdomen. Passage of more gas than usual.  Walking can help get rid of the air that was put into your GI tract during the procedure and reduce the bloating. If you had a lower endoscopy (such as a colonoscopy or flexible sigmoidoscopy) you may notice spotting of blood in your stool or on the toilet paper. If you underwent a bowel prep for your procedure, you may not have a normal bowel movement for a few days.  Please Note:  You might notice some irritation and congestion in your nose or some drainage.  This is from the oxygen used during your procedure.  There is no need for concern and it should clear up in a day or so.  SYMPTOMS TO REPORT IMMEDIATELY:  Following lower endoscopy (flexible sigmoidoscopy):  Excessive amounts of blood in the stool  Significant tenderness or worsening of abdominal pains  Swelling of the abdomen that is new, acute  Fever of 100F or higher  For urgent or emergent issues, a gastroenterologist can be reached at any hour by calling [phone]. Do not use MyChart messaging for urgent concerns.   DIET:  We do recommend a small meal at first, but then you may proceed to your regular diet.  Drink plenty of fluids but you should avoid  alcoholic beverages for 24 hours.  ACTIVITY:  You should plan to take it easy for the rest of today and you should NOT DRIVE or use heavy machinery until tomorrow (because of the sedation medicines used during the test).    FOLLOW UP: Our staff will call the number listed on your records the next business day following your procedure.  We will call around 7:15- 8:00 am to check on you and address any questions or concerns that you may have regarding the information given to you following your procedure. If we do not reach you, we will leave a message.     If any biopsies were taken you will be contacted by phone or by letter within the next 1-3 weeks.  Please call us at [phone] if you have not heard about the biopsies in 3 weeks.   SIGNATURES/CONFIDENTIALITY: You and/or your care partner have signed paperwork which will be entered into your electronic medical record.  These signatures attest to the fact that that the information above on your After Visit Summary has been reviewed and is understood.  Full responsibility of the confidentiality of this discharge information lies with you and/or your care-partner.

## 2024-02-25 NOTE — Progress Notes (Signed)
 GASTROENTEROLOGY PROCEDURE H&P NOTE   Primary Care Physician: Danelle Berry, [name]  HPI: Angela Gordon is a 53 y.o. female who presents for flexible sigmoidoscopy for followup of previous CAP polyposis (s/p EMR/ESD) and then inflammatory changes with prolapse.  Past Medical History:  Diagnosis Date   Anemia    Asthma    Uses rescue inhaler occasionally.   Complication of anesthesia    slow to wake up   Family history of adverse reaction to anesthesia    Sister- Cardiac arrest during emergency c-section   Family history of ovarian cancer 07/26/2017   Headache    occasional - otc med prn   Heart murmur    was told she had MVP when she was around 24 years, she states she never followed up with that and has never had any issues from it   History of blood transfusion 1992   Blood Loss from Toxemia of Pregnancy   Hypoglycemia    Osteoporosis    Strong FHx of Osteoporosis.   Osteoporosis 10/06/2019   PONV (postoperative nausea and vomiting)    Rectal polyp 01/11/2020   Scoliosis    Seen Dr. Gavin Potters   Shingles    Sickle-cell trait [facility])    Stroke [facility]) 1992   Toxemia of Pregnancy    Past Surgical History:  Procedure Laterality Date   ABDOMINAL HYSTERECTOMY  2010   total -Menopausal symptoms, takes HRT   BIOPSY  02/26/2020   Procedure: BIOPSY;  Surgeon: Lemar Lofty., MD;  Location: [facility] ENDOSCOPY;  Service: Gastroenterology;;   BIOPSY  08/19/2020   Procedure: BIOPSY;  Surgeon: Lemar Lofty., MD;  Location: [facility] ENDOSCOPY;  Service: Gastroenterology;;   BREAST EXCISIONAL BIOPSY Left 2003   neg bx dr Evette Cristal   BREAST SURGERY Left 2004   biopsy, benign   COLONOSCOPY N/A 08/19/2020   Procedure: COLONOSCOPY;  Surgeon: Lemar Lofty., MD;  Location: [facility] ENDOSCOPY;  Service: Gastroenterology;  Laterality: N/A;   COLONOSCOPY  2022   GM-MAC-prep adequate-int ext hems/2 yr recall   COLONOSCOPY W/ BIOPSIES  05/09/2020   [facility] - removed a "Cap Polyp" per  patient   COLONOSCOPY WITH PROPOFOL N/A 11/29/2019   Procedure: COLONOSCOPY WITH PROPOFOL;  Surgeon: Pasty Spillers, MD;  Location: [facility] ENDOSCOPY;  Service: Endoscopy;  Laterality: N/A;   COLONOSCOPY WITH PROPOFOL N/A 02/26/2020   Procedure: COLONOSCOPY WITH PROPOFOL;  Surgeon: Meridee Score Netty Starring., MD;  Location: [facility] ENDOSCOPY;  Service: Gastroenterology;  Laterality: N/A;   CYST REMOVAL HAND Right 2005-06   ESOPHAGOGASTRODUODENOSCOPY (EGD) WITH PROPOFOL N/A 08/19/2020   Procedure: ESOPHAGOGASTRODUODENOSCOPY (EGD) WITH PROPOFOL;  Surgeon: Meridee Score Netty Starring., MD;  Location: [facility] ENDOSCOPY;  Service: Gastroenterology;  Laterality: N/A;   EUS  02/26/2020   Procedure: LOWER ENDOSCOPIC ULTRASOUND (EUS);  Surgeon: Lemar Lofty., MD;  Location: [facility] ENDOSCOPY;  Service: Gastroenterology;;   Wenda Low SIGMOIDOSCOPY  05/17/20, 05/19/20   x 2 at [facility] [place] Right 10/16/2020   Procedure: DOUBLE OSTEOTOMY RIGHT;  Surgeon: Gwyneth Revels, DPM;  Location: [facility]ERY [facility];  Service: Podiatry;  Laterality: Right;   HERNIA REPAIR  2004   umbilical   TONSILLECTOMY  1978   Current Outpatient Medications  Medication Sig Dispense Refill   mesalamine (CANASA) 1000 MG suppository Place 1 suppository (1,000 mg total) rectally at bedtime. (Patient not taking: Reported on 02/04/2024) 30 suppository 6   acetaminophen (TYLENOL) 325 MG tablet Take 325-650 mg by mouth every 6 (six) hours as needed for moderate  pain.     albuterol (VENTOLIN HFA) 108 (90 Base) MCG/ACT inhaler Inhale 2 puffs into the lungs every 6 (six) hours as needed for wheezing or shortness of breath. 18 g 2   calcium carbonate (OSCAL) 1500 (600 Ca) MG TABS tablet Take 600 mg of elemental calcium by mouth 2 (two) times daily.     Cholecalciferol (VITAMIN D3) 125 MCG (5000 UT) TABS Take 5,000 Units by mouth every Monday.      cyanocobalamin 2000 MCG tablet Take 2,000 mcg by mouth daily.     dicyclomine  (BENTYL) 10 MG capsule Take 1 capsule (10 mg total) by mouth 2 (two) times daily as needed (abdominal cramps). 30 capsule 1   diphenhydrAMINE (BENADRYL) 25 MG tablet Take 25 mg by mouth daily as needed for allergies.     hydrocortisone 2.5 % cream Apply topically 2 (two) times daily as needed.     levocetirizine (XYZAL) 5 MG tablet TAKE 1 TABLET BY MOUTH EVERY DAY IN THE EVENING 30 tablet 1   meclizine (ANTIVERT) 25 MG tablet Take 0.5-1 tablets (12.5-25 mg total) by mouth 3 (three) times daily as needed for dizziness. 30 tablet 0   Na Sulfate-K Sulfate-Mg Sulf 17.5-3.13-1.6 GM/177ML SOLN See admin instructions. (Patient not taking: Reported on 02/04/2024)     omeprazole (PRILOSEC) 20 MG capsule Take 1 capsule (20 mg total) by mouth daily for 30 days, THEN 1 capsule (20 mg total) daily. 90 capsule 3   ondansetron (ZOFRAN) 4 MG tablet Take 1-1.5 tablets (4-6 mg total) by mouth every 8 (eight) hours as needed for nausea or vomiting. 20 tablet 1   polyethylene glycol powder (GLYCOLAX/MIRALAX) 17 GM/SCOOP powder Take 17 g by mouth 2 (two) times daily as needed. 3350 g 0   progesterone (PROMETRIUM) 100 MG capsule Take 100 mg by mouth at bedtime.     promethazine (PHENERGAN) 25 MG tablet Take 1 tablet (25 mg total) by mouth every 4 (four) hours as needed for nausea or vomiting. 10 tablet 0   tiZANidine (ZANAFLEX) 4 MG tablet Take 0.5-1.5 tablets (2-6 mg total) by mouth every 8 (eight) hours as needed for muscle spasms (muscle tightness). 90 tablet 0   Current Facility-Administered Medications  Medication Dose Route Frequency Provider Last Rate Last Admin   0.9 %  sodium chloride infusion  500 mL Intravenous Once Mansouraty, Netty Starring., MD        Current Outpatient Medications:    mesalamine (CANASA) 1000 MG suppository, Place 1 suppository (1,000 mg total) rectally at bedtime. (Patient not taking: Reported on 02/04/2024), Disp: 30 suppository, Rfl: 6   acetaminophen (TYLENOL) 325 MG tablet, Take 325-650 mg  by mouth every 6 (six) hours as needed for moderate pain., Disp: , Rfl:    albuterol (VENTOLIN HFA) 108 (90 Base) MCG/ACT inhaler, Inhale 2 puffs into the lungs every 6 (six) hours as needed for wheezing or shortness of breath., Disp: 18 g, Rfl: 2   calcium carbonate (OSCAL) 1500 (600 Ca) MG TABS tablet, Take 600 mg of elemental calcium by mouth 2 (two) times daily., Disp: , Rfl:    Cholecalciferol (VITAMIN D3) 125 MCG (5000 UT) TABS, Take 5,000 Units by mouth every Monday. , Disp: , Rfl:    cyanocobalamin 2000 MCG tablet, Take 2,000 mcg by mouth daily., Disp: , Rfl:    dicyclomine (BENTYL) 10 MG capsule, Take 1 capsule (10 mg total) by mouth 2 (two) times daily as needed (abdominal cramps)., Disp: 30 capsule, Rfl: 1   diphenhydrAMINE (BENADRYL)  25 MG tablet, Take 25 mg by mouth daily as needed for allergies., Disp: , Rfl:    hydrocortisone 2.5 % cream, Apply topically 2 (two) times daily as needed., Disp: , Rfl:    levocetirizine (XYZAL) 5 MG tablet, TAKE 1 TABLET BY MOUTH EVERY DAY IN THE EVENING, Disp: 30 tablet, Rfl: 1   meclizine (ANTIVERT) 25 MG tablet, Take 0.5-1 tablets (12.5-25 mg total) by mouth 3 (three) times daily as needed for dizziness., Disp: 30 tablet, Rfl: 0   Na Sulfate-K Sulfate-Mg Sulf 17.5-3.13-1.6 GM/177ML SOLN, See admin instructions. (Patient not taking: Reported on 02/04/2024), Disp: , Rfl:    omeprazole (PRILOSEC) 20 MG capsule, Take 1 capsule (20 mg total) by mouth daily for 30 days, THEN 1 capsule (20 mg total) daily., Disp: 90 capsule, Rfl: 3   ondansetron (ZOFRAN) 4 MG tablet, Take 1-1.5 tablets (4-6 mg total) by mouth every 8 (eight) hours as needed for nausea or vomiting., Disp: 20 tablet, Rfl: 1   polyethylene glycol powder (GLYCOLAX/MIRALAX) 17 GM/SCOOP powder, Take 17 g by mouth 2 (two) times daily as needed., Disp: 3350 g, Rfl: 0   progesterone (PROMETRIUM) 100 MG capsule, Take 100 mg by mouth at bedtime., Disp: , Rfl:    promethazine (PHENERGAN) 25 MG tablet, Take 1  tablet (25 mg total) by mouth every 4 (four) hours as needed for nausea or vomiting., Disp: 10 tablet, Rfl: 0   tiZANidine (ZANAFLEX) 4 MG tablet, Take 0.5-1.5 tablets (2-6 mg total) by mouth every 8 (eight) hours as needed for muscle spasms (muscle tightness)., Disp: 90 tablet, Rfl: 0  Current Facility-Administered Medications:    0.9 %  sodium chloride infusion, 500 mL, Intravenous, Once, Mansouraty, Netty Starring., MD Allergies  Allergen Reactions   Erythromycin Base Other (See Comments)    Several family members have severe side effects, pt has never taken but should avoid    Penicillins Diarrhea and Rash    Did it involve swelling of the face/tongue/throat, SOB, or low BP? Unknown  Did it involve sudden or severe rash/hives, skin peeling, or any reaction on the inside of your mouth or nose? Unknown  Did you need to seek medical attention at a hospital or doctor's office? Unknown  When did it last happen?    Childhood    If all above answers are "NO", may proceed with cephalosporin use.   Shellfish Allergy Anaphylaxis   Family History  Problem Relation Age of Onset   Hypertension Mother    Osteoporosis Mother    Cervical cancer Mother        Hysterectomy in 30s.   Stroke Mother    Sickle cell trait Father    Sickle cell trait Sister    Heart disease Maternal Aunt    Breast cancer Maternal Aunt        2 mat aunts   Heart disease Maternal Uncle    Hypertension Maternal Uncle    Ovarian cancer Paternal Aunt 105   Breast cancer Paternal Aunt        pat great aunt   Heart disease Maternal Grandmother    Hypertension Maternal Grandmother    Osteoporosis Maternal Grandmother    Rheum arthritis Maternal Grandfather    Hypertension Maternal Grandfather    Prostate cancer Maternal Grandfather    Leukemia Cousin    Colon cancer Neg Hx    Esophageal cancer Neg Hx    Pancreatic cancer Neg Hx    Stomach cancer Neg Hx    Inflammatory bowel disease  Neg Hx    Liver disease Neg Hx     Rectal cancer Neg Hx    Colon polyps Neg Hx    Social History   Socioeconomic History   Marital status: Married    Spouse name: julius   Number of children: 3   Years of education: Not on file   Highest education level: Not on file  Occupational History   Not on file  Tobacco Use   Smoking status: Never   Smokeless tobacco: Never  Vaping Use   Vaping status: Never Used  Substance and Sexual Activity   Alcohol use: Yes    Alcohol/week: 1.0 standard drink of alcohol    Types: 1 Standard drinks or equivalent per week    Comment: rarely   Drug use: No   Sexual activity: Yes    Birth control/protection: Surgical    Comment: Hysterectomy  Other Topics Concern   Not on file  Social History Narrative   Not on file   Social Drivers of Health   Financial Resource Strain: Low Risk  (12/04/2022)   Overall Financial Resource Strain (CARDIA)    Difficulty of Paying Living Expenses: Not hard at all  Food Insecurity: No Food Insecurity (12/04/2022)   Hunger Vital Sign    Worried About Running Out of Food in the Last Year: Never true    Ran Out of Food in the Last Year: Never true  Transportation Needs: No Transportation Needs (12/03/2021)   PRAPARE - Administrator, Civil Service (Medical): No    Lack of Transportation (Non-Medical): No  Physical Activity: Insufficiently Active (12/03/2021)   Exercise Vital Sign    Days of Exercise per Week: 1 day    Minutes of Exercise per Session: 10 min  Stress: No Stress Concern Present (12/04/2022)   [employer] of Occupational Health - Occupational Stress Questionnaire    Feeling of Stress : Only a little  Social Connections: Socially Integrated (12/04/2022)   Social Connection and Isolation Panel [NHANES]    Frequency of Communication with Friends and Family: More than three times a week    Frequency of Social Gatherings with Friends and Family: More than three times a week    Attends Religious Services: 1 to 4 times per year     Active Member of [employer] or Organizations: No    Attends Engineer, structural: More than 4 times per year    Marital Status: Married  Catering manager Violence: Not At Risk (12/04/2022)   Humiliation, Afraid, Rape, and Kick questionnaire    Fear of Current or Ex-Partner: No    Emotionally Abused: No    Physically Abused: No    Sexually Abused: No    Physical Exam: Today's Vitals   02/25/24 0837 02/25/24 0838  BP: 121/73   Pulse: 75   Temp: 98.1 F (36.7 C) 98.1 F (36.7 C)  TempSrc: Skin   SpO2: 100%   Weight: 105 lb (47.6 kg)   Height: 5\' 2"  (1.575 m)    Body mass index is 19.2 kg/m. GEN: NAD EYE: Sclerae anicteric ENT: MMM CV: Non-tachycardic GI: Soft, NT/ND NEURO:  Alert & Oriented x 3  Lab Results: No results for input(s): "WBC", "HGB", "HCT", "PLT" in the last 72 hours. BMET No results for input(s): "NA", "K", "CL", "CO2", "GLUCOSE", "BUN", "CREATININE", "CALCIUM" in the last 72 hours. LFT No results for input(s): "PROT", "ALBUMIN", "AST", "ALT", "ALKPHOS", "BILITOT", "BILIDIR", "IBILI" in the last 72 hours. PT/INR No results for  input(s): "LABPROT", "INR" in the last 72 hours.   Impression / Plan: This is a 53 y.o.female who presents for flexible sigmoidoscopy for followup of previous CAP polyposis (s/p EMR/ESD) and then inflammatory changes with prolapse.  The risks and benefits of endoscopic evaluation/treatment were discussed with the patient and/or family; these include but are not limited to the risk of perforation, infection, bleeding, missed lesions, lack of diagnosis, severe illness requiring hospitalization, as well as anesthesia and sedation related illnesses.  The patient's history has been reviewed, patient examined, no change in status, and deemed stable for procedure.  The patient and/or family is agreeable to proceed.    Corliss Parish, MD [facility] Advanced [facility]oscopy Office # [phone]

## 2024-02-28 ENCOUNTER — Telehealth: Payer: Self-pay

## 2024-02-28 NOTE — Telephone Encounter (Signed)
  Follow up Call-     02/25/2024    8:38 AM 05/14/2023    1:19 PM  Call back number  Post procedure Call Back phone  # [phone]  Permission to leave phone message Yes Yes    Attempted to call regarding follow-up. No answer, VM left.

## 2024-02-29 LAB — SURGICAL PATHOLOGY

## 2024-03-02 ENCOUNTER — Encounter: Payer: Self-pay | Admitting: Gastroenterology

## 2024-04-20 ENCOUNTER — Encounter: Payer: Self-pay | Admitting: Family Medicine

## 2024-04-20 DIAGNOSIS — R519 Headache, unspecified: Secondary | ICD-10-CM

## 2024-04-20 DIAGNOSIS — R110 Nausea: Secondary | ICD-10-CM

## 2024-04-20 NOTE — Telephone Encounter (Signed)
 Per Leisa pt will need to schedule appt. lvm

## 2024-04-20 NOTE — Telephone Encounter (Signed)
 Pt hasn't had appt for over a year with me or for the Dx/meds I cannot refill w/o f/up appt in office

## 2024-04-25 NOTE — Telephone Encounter (Signed)
 Lvm asking pt to return call to schedule appt for med refills

## 2024-05-01 ENCOUNTER — Ambulatory Visit: Admitting: Family Medicine

## 2024-05-01 VITALS — BP 118/70 | HR 88 | Resp 16 | Ht 62.0 in | Wt 108.0 lb

## 2024-05-01 DIAGNOSIS — R519 Headache, unspecified: Secondary | ICD-10-CM

## 2024-05-01 DIAGNOSIS — M542 Cervicalgia: Secondary | ICD-10-CM

## 2024-05-01 DIAGNOSIS — R5383 Other fatigue: Secondary | ICD-10-CM

## 2024-05-01 DIAGNOSIS — K909 Intestinal malabsorption, unspecified: Secondary | ICD-10-CM

## 2024-05-01 DIAGNOSIS — M546 Pain in thoracic spine: Secondary | ICD-10-CM

## 2024-05-01 DIAGNOSIS — E559 Vitamin D deficiency, unspecified: Secondary | ICD-10-CM

## 2024-05-01 DIAGNOSIS — G8929 Other chronic pain: Secondary | ICD-10-CM

## 2024-05-01 DIAGNOSIS — M810 Age-related osteoporosis without current pathological fracture: Secondary | ICD-10-CM

## 2024-05-01 DIAGNOSIS — R4586 Emotional lability: Secondary | ICD-10-CM

## 2024-05-01 DIAGNOSIS — D649 Anemia, unspecified: Secondary | ICD-10-CM

## 2024-05-01 DIAGNOSIS — R6889 Other general symptoms and signs: Secondary | ICD-10-CM

## 2024-05-01 DIAGNOSIS — E538 Deficiency of other specified B group vitamins: Secondary | ICD-10-CM

## 2024-05-01 DIAGNOSIS — F321 Major depressive disorder, single episode, moderate: Secondary | ICD-10-CM

## 2024-05-01 MED ORDER — PROMETHAZINE HCL 25 MG PO TABS: 25.0000 mg | ORAL_TABLET | ORAL | 11 refills | Status: AC | PRN

## 2024-05-01 MED ORDER — TIZANIDINE HCL 4 MG PO TABS: 2.0000 mg | ORAL_TABLET | Freq: Three times a day (TID) | ORAL | 3 refills | Status: AC | PRN

## 2024-05-01 NOTE — Progress Notes (Signed)
 Patient ID: Angela Gordon, female    DOB: 06-19-1971, 53 y.o.   MRN: [id number]  PCP: Adeline Hone, [name]  Chief Complaint  Patient presents with   Headache    On/off x1 year   Depression    X2 weeks   Knee Pain    L knee, x1 week. Can't cross knees- causes pain. "Feels like something is pulling"  Explained left knee CC we won't get to it today with her other complaints - onset <1 week ago  Subjective:   Angela Gordon is a 53 y.o. female, presents to clinic with CC of the following:  HPI  F/up on headaches/med refill for muscle relaxers  HA's she uses OTC meds to manage then phenergan  for associated nausea Pain and tension to neck and upper back, uses OTC meds, heat, PT home exercises and then muscle relaxers prn  New mood changes/depression - gradually worsening over months to years    05/01/2024    2:27 PM 04/02/2023    1:34 PM 12/17/2022    9:18 AM  Depression screen PHQ 2/9  Decreased Interest 3 0 0  Down, Depressed, Hopeless 2 0 0  PHQ - 2 Score 5 0 0  Altered sleeping 2 0   Tired, decreased energy 3 0   Change in appetite 3 0   Feeling bad or failure about yourself  3 0   Trouble concentrating 2 0   Moving slowly or fidgety/restless 0 0   Suicidal thoughts 0 0   PHQ-9 Score 18 0   Difficult doing work/chores  Not difficult at all       05/01/2024    2:28 PM 12/03/2021    2:56 PM  GAD 7 : Generalized Anxiety Score  Nervous, Anxious, on Edge 0 0  Control/stop worrying 3 0  Worry too much - different things 1 0  Trouble relaxing 1 0  Restless 0 0  Easily annoyed or irritable 3 0  Afraid - awful might happen 0 0  Total GAD 7 Score 8 0  Anxiety Difficulty  Not difficult at all  She just feels like she can't do anything right, and she just wants to lay around, staying in the bed more, and normally she is out doing things, but she's last interest, she's only getting out of bed to go with her daughter and otherwise she would stay in bed all day long and work  from bed if she could. 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   Pure hypercholesterolemia 11/27/2020   Cap polyposis (HCC) 07/27/2020   Unintentional weight loss 07/27/2020   Intestinal malabsorption 05/31/2020   Hypocalcemia 05/31/2020   Anemia 05/31/2020   B12 deficiency 05/31/2020   IBD (inflammatory bowel disease) 05/31/2020   Vitamin D  deficiency 05/07/2016   Sickle-cell trait (HCC) 05/07/2016   Osteoporosis, 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 , Disp: , Rfl:    cyanocobalamin  2000 MCG tablet, Take 2,000 mcg by mouth daily., Disp: , Rfl:    diphenhydrAMINE (BENADRYL) 25 MG tablet, Take 25 mg by mouth daily as needed for allergies., Disp: , Rfl:    hydrocortisone  2.5 % cream, Apply topically 2 (two) times daily as needed., Disp: , Rfl:    meclizine  (ANTIVERT ) 25 MG tablet, Take 0.5-1 tablets (12.5-25 mg total) by mouth 3 (three) times daily as needed for dizziness., Disp: 30 tablet, Rfl: 0   mesalamine  (CANASA ) 1000 MG suppository, Place 1 suppository (1,000 mg total)  rectally at bedtime. (Patient not taking: Reported on 02/04/2024), Disp: 30 suppository, Rfl: 6   polyethylene glycol powder (GLYCOLAX /MIRALAX ) 17 GM/SCOOP powder, Take 17 g by mouth 2 (two) times daily as needed., Disp: 3350 g, Rfl: 0   progesterone (PROMETRIUM) 100 MG capsule, Take 100 mg by mouth at bedtime., Disp: , Rfl:    promethazine  (PHENERGAN ) 25 MG tablet, Take 1 tablet (25 mg total) by mouth every 4 (four) hours as needed for nausea or vomiting., Disp: 10 tablet, Rfl: 0   tiZANidine  (ZANAFLEX ) 4 MG tablet, Take 0.5-1.5 tablets (2-6 mg total) by mouth every 8 (eight) hours as needed for muscle spasms (muscle tightness)., Disp: 90 tablet, Rfl: 0   dicyclomine  (BENTYL ) 10 MG capsule, Take 1 capsule (10 mg total) by mouth 2 (two) times daily as needed (abdominal cramps). (Patient not taking: Reported on 05/01/2024), Disp: 30 capsule, Rfl: 1   levocetirizine (XYZAL ) 5 MG tablet, TAKE 1 TABLET BY MOUTH EVERY DAY IN THE EVENING (Patient not taking: Reported on 05/01/2024), Disp: 30 tablet, Rfl: 1   Na Sulfate-K Sulfate-Mg Sulf 17.5-3.13-1.6 GM/177ML SOLN, See admin instructions. (Patient not taking: Reported on 02/04/2024), Disp: , Rfl:    omeprazole  (PRILOSEC) 20 MG capsule, Take 1 capsule (20 mg total) by mouth daily for 30 days, THEN 1 capsule (20 mg total) daily., Disp: 90 capsule, Rfl: 3   Allergies  Allergen Reactions   Erythromycin Base Other (See Comments)    Several family members have severe side effects, pt has never taken but should avoid    Penicillins Diarrhea and Rash    Did it involve swelling of the face/tongue/throat, SOB, or low BP? Unknown  Did it involve sudden or severe rash/hives, skin peeling, or any reaction on the inside of your mouth or nose? Unknown  Did you need to seek medical attention at a hospital or doctor's office? Unknown  When did it last happen?    Childhood    If all above answers are "NO", may proceed with cephalosporin use.   Shellfish Allergy Anaphylaxis      Social History   Tobacco Use   Smoking status: Never   Smokeless tobacco: Never  Vaping Use   Vaping status: Never Used  Substance Use Topics   Alcohol use: Yes    Alcohol/week: 1.0 standard drink of alcohol    Types: 1 Standard drinks or equivalent per week    Comment: rarely   Drug use: No      Chart Review Today: I personally reviewed  active problem list, medication list, allergies, family history, social history, health maintenance, notes from last encounter, lab results, imaging with the patient/caregiver today.   Review of Systems  Constitutional: Negative.   HENT: Negative.    Eyes: Negative.   Respiratory: Negative.    Cardiovascular: Negative.   Gastrointestinal: Negative.   Endocrine: Negative.   Genitourinary: Negative.   Musculoskeletal: Negative.   Skin: Negative.   Allergic/Immunologic: Negative.   Neurological: Negative.   Hematological: Negative.   Psychiatric/Behavioral: Negative.    All other systems reviewed and are negative.      Objective:   Vitals:   05/01/24 1422  BP: 118/70  Pulse: 88  Resp: 16  SpO2: 97%  Weight: 108 lb (49 kg)  Height: 5\' 2"  (1.575 m)    Body mass index is 19.75 kg/m.  Physical Exam Vitals and nursing note reviewed.  Constitutional:      General: She is not in acute distress.    Appearance: Normal appearance. She is well-developed. She is not ill-appearing, toxic-appearing or diaphoretic.  HENT:     Head: Normocephalic and atraumatic.     Right Ear: External ear normal.     Left Ear: External ear normal.     Nose: Nose normal.  Eyes:     General: No scleral icterus.       Right eye: No discharge.        Left eye: No discharge.     Conjunctiva/sclera: Conjunctivae normal.  Neck:     Trachea: No tracheal deviation.  Cardiovascular:     Rate and Rhythm: Normal rate.  Pulmonary:     Effort: Pulmonary effort is normal. No respiratory distress.     Breath sounds: No stridor.  Skin:    General:  Skin is warm and dry.     Findings: No rash.  Neurological:     Mental Status: She is alert.     Motor: No abnormal muscle tone.     Coordination: Coordination normal.     Gait: Gait normal.  Psychiatric:        Mood and Affect: Mood normal.        Behavior: Behavior normal.      Results for orders placed or performed in visit on 02/25/24  Surgical pathology ([facility])   Collection Time: 02/25/24 12:00 AM  Result Value Ref Range   SURGICAL PATHOLOGY      SURGICAL PATHOLOGY [facility] [address] [place], [place] [address] Telephone [phone] or [phone] Fax [phone]  REPORT OF SURGICAL PATHOLOGY   Accession #: [id number] Patient Name: RILIE, GLANZ Visit # : [id number]  MRN: [id number] Physician: Yong Henle DOB/Age [birth year] (Age: 23) Gender: F Collected Date: 02/25/2024 Received Date: 02/28/2024  FINAL DIAGNOSIS       1. Surgical [P], colon, rectum :       COMPATIBLE WITH AN INFLAMMATORY/GRANULATION TISSUE POLYP WITH CHANGES OF MUCOSAL      PROLAPSE, MULTIPLE FRAGMENTS      NEGATIVE FOR DYSPLASIA AND CARCINOMA       2. Surgical [P], left colon :       BENIGN COLONIC MUCOSA WITH NO DIAGNOSTIC ABNORMALITY       ELECTRONIC SIGNATURE : Picklesimer Md, Fred , Sports administrator, International aid/development worker  MICROSCOPIC DESCRIPTION  CASE COMMENTS STAINS USED IN DIAGNOSIS: H&E H&E    CLINICAL HISTORY  SPECIMEN(S) OBTAINED 1. Surgical [P], Colon, Rectu m 2. Surgical [P], Left Colon  SPECIMEN COMMENTS: 1. Cap polyposis (HCC)  SPECIMEN CLINICAL INFORMATION: 1. R/O dysplasia 2. R/O chronic colitis    Gross Description 1. Received in formalin are tan, soft tissue fragments that are submitted in toto.Number: 4, Size: 0.2 cm smallest to 0.5 cm largest, (1B) ( TA ) 2. Received in formalin are tan, soft tissue fragments that are submitted in toto.Number: multiple, Size: 0.2 cm smallest to 0.4 cm largest,  (1B) ( TA )        Report signed out from the following location(s) [place] [address] [place], [place] [address] CLIA #: [id number]  [facility] [address] [place], [place] [address] CLIA #: [id number]        Assessment & Plan:     ICD-10-CM   1. Nonintractable episodic headache, unspecified headache type  R51.9 promethazine  (PHENERGAN ) 25 MG tablet   OTC meds tension HA/tylenol , excedrine migraine, then some bad HA's need phenergan  for associated nausea    2. Neck pain  M54.2 tiZANidine  (ZANAFLEX ) 4 MG tablet   chronic, refills on muscle relaxers    3. Chronic right-sided thoracic back pain  M54.6 tiZANidine  (ZANAFLEX ) 4 MG tablet   G89.29    chronic flared up with more activity and she uses PRN muscle relaxers after doing her home PT exercises, heat and OTC meds    4. Mood changes  R45.86 CBC with Differential/Platelet    Comprehensive metabolic panel with GFR    Vitamin B12    Iron, TIBC and Ferritin Panel    VITAMIN D  25 Hydroxy (Vit-D Deficiency, Fractures)    Thyroid  Panel With TSH   worsening mood and energy, over months, check labs, r/o hypothyroid anemia and deficiencies, consider therapy/meds    5. Current moderate episode of major depressive disorder, unspecified whether recurrent (HCC)  F32.1 Thyroid  Panel With TSH   phq 9 screening very high, pt here today for worsening moods, no desire, wants to lay in bed all day for months, no SI, discussed med options (see below)    6. Change in weight  R68.89 CBC with Differential/Platelet    Comprehensive metabolic panel with GFR    Vitamin B12    Iron, TIBC and Ferritin Panel    VITAMIN D  25 Hydroxy (Vit-D Deficiency, Fractures)    Thyroid  Panel With TSH   weight loss, decreased appetite    7. Fatigue, unspecified type  R53.83 CBC with Differential/Platelet    Comprehensive metabolic panel with GFR    Vitamin B12    Iron, TIBC and Ferritin Panel    VITAMIN D  25  Hydroxy (Vit-D Deficiency, Fractures)    Thyroid  Panel With TSH   generalized, recheck deficiencies, anemia and thyroid     8. Vitamin D  deficiency  E55.9 VITAMIN D  25 Hydroxy (Vit-D Deficiency, Fractures)    9. Osteoporosis, post-menopausal  M81.0     10. Intestinal malabsorption, unspecified type  K90.9 CBC with Differential/Platelet    Comprehensive metabolic panel with GFR    Vitamin B12    Iron, TIBC and Ferritin Panel    VITAMIN D  25 Hydroxy (Vit-D Deficiency, Fractures)    Thyroid  Panel With TSH    11. Anemia, unspecified type  D64.9 CBC with Differential/Platelet    Vitamin B12    Iron, TIBC and Ferritin Panel    12. B12 deficiency  E53.8 CBC with Differential/Platelet    Vitamin B12     Discussed med options for her MDD sx - would avoid sedating meds, consider lexapro, prozac, wellbutrin as options, other resources  printed for her, consider therapy   she will think about meds and let me know if she wants to try one    Adeline Hone, [name] 05/01/24 2:36 PM

## 2024-05-01 NOTE — Patient Instructions (Signed)
 Call me if you find a practice you would like me to refer you to.  Also consider trying lexapro, prozac or wellbutrin for your symptoms  They usually take 4-8 weeks to work.

## 2024-05-02 ENCOUNTER — Ambulatory Visit: Payer: Self-pay | Admitting: Family Medicine

## 2024-05-02 LAB — CBC WITH DIFFERENTIAL/PLATELET
Absolute Lymphocytes: 2718 {cells}/uL (ref 850–3900)
Absolute Monocytes: 568 {cells}/uL (ref 200–950)
Basophils Absolute: 69 {cells}/uL (ref 0–200)
Basophils Relative: 0.8 %
Eosinophils Absolute: 318 {cells}/uL (ref 15–500)
Eosinophils Relative: 3.7 %
HCT: 45.5 % — ABNORMAL HIGH (ref 35.0–45.0)
Hemoglobin: 15.1 g/dL (ref 11.7–15.5)
MCH: 31.6 pg (ref 27.0–33.0)
MCHC: 33.2 g/dL (ref 32.0–36.0)
MCV: 95.2 fL (ref 80.0–100.0)
MPV: 10.5 fL (ref 7.5–12.5)
Monocytes Relative: 6.6 %
Neutro Abs: 4928 {cells}/uL (ref 1500–7800)
Neutrophils Relative %: 57.3 %
Platelets: 268 10*3/uL (ref 140–400)
RBC: 4.78 10*6/uL (ref 3.80–5.10)
RDW: 11.3 % (ref 11.0–15.0)
Total Lymphocyte: 31.6 %
WBC: 8.6 10*3/uL (ref 3.8–10.8)

## 2024-05-02 LAB — COMPREHENSIVE METABOLIC PANEL WITH GFR
AG Ratio: 1.7 (calc) (ref 1.0–2.5)
ALT: 17 U/L (ref 6–29)
AST: 17 U/L (ref 10–35)
Albumin: 4.5 g/dL (ref 3.6–5.1)
Alkaline phosphatase (APISO): 61 U/L (ref 37–153)
BUN: 9 mg/dL (ref 7–25)
CO2: 26 mmol/L (ref 20–32)
Calcium: 9.7 mg/dL (ref 8.6–10.4)
Chloride: 104 mmol/L (ref 98–110)
Creat: 0.83 mg/dL (ref 0.50–1.03)
Globulin: 2.6 g/dL (ref 1.9–3.7)
Glucose, Bld: 71 mg/dL (ref 65–99)
Potassium: 4.2 mmol/L (ref 3.5–5.3)
Sodium: 140 mmol/L (ref 135–146)
Total Bilirubin: 0.9 mg/dL (ref 0.2–1.2)
Total Protein: 7.1 g/dL (ref 6.1–8.1)
eGFR: 85 mL/min/{1.73_m2} (ref >=60)

## 2024-05-02 LAB — THYROID PANEL WITH TSH
Free Thyroxine Index: 1.7 (ref 1.4–3.8)
T3 Uptake: 30 % (ref 22–35)
T4, Total: 5.6 ug/dL (ref 5.1–11.9)
TSH: 0.79 m[IU]/L

## 2024-05-02 LAB — VITAMIN B12: Vitamin B-12: 436 pg/mL (ref 200–1100)

## 2024-05-02 LAB — VITAMIN D 25 HYDROXY (VIT D DEFICIENCY, FRACTURES): Vit D, 25-Hydroxy: 35 ng/mL (ref 30–100)

## 2024-05-02 LAB — IRON,TIBC AND FERRITIN PANEL
%SAT: 26 % (ref 16–45)
Ferritin: 23 ng/mL (ref 16–232)
Iron: 76 ug/dL (ref 45–160)
TIBC: 288 ug/dL (ref 250–450)

## 2024-05-10 ENCOUNTER — Encounter: Payer: Self-pay | Admitting: Family Medicine

## 2024-05-11 ENCOUNTER — Other Ambulatory Visit: Payer: Self-pay

## 2024-05-11 DIAGNOSIS — G8929 Other chronic pain: Secondary | ICD-10-CM

## 2024-05-21 ENCOUNTER — Emergency Department

## 2024-05-21 ENCOUNTER — Encounter: Payer: Self-pay | Admitting: Gastroenterology

## 2024-05-21 ENCOUNTER — Emergency Department
Admission: EM | Admit: 2024-05-21 | Discharge: 2024-05-21 | Disposition: A | Discharge status: Home / Self Care | Attending: Emergency Medicine | Admitting: Emergency Medicine

## 2024-05-21 ENCOUNTER — Telehealth: Payer: Self-pay | Admitting: Physician Assistant

## 2024-05-21 ENCOUNTER — Encounter: Payer: Self-pay | Admitting: Emergency Medicine

## 2024-05-21 ENCOUNTER — Other Ambulatory Visit: Payer: Self-pay

## 2024-05-21 DIAGNOSIS — S3992XA Unspecified injury of lower back, initial encounter: Secondary | ICD-10-CM | POA: Diagnosis not present

## 2024-05-21 DIAGNOSIS — X58XXXA Exposure to other specified factors, initial encounter: Secondary | ICD-10-CM | POA: Diagnosis not present

## 2024-05-21 DIAGNOSIS — K6289 Other specified diseases of anus and rectum: Secondary | ICD-10-CM | POA: Diagnosis present

## 2024-05-21 MED ORDER — LIDOCAINE 5 % EX PTCH
1.0000 | MEDICATED_PATCH | Freq: Once | CUTANEOUS | Status: DC
  Administered 2024-05-21: 1 via TRANSDERMAL
  Filled 2024-05-21: qty 1

## 2024-05-21 MED ORDER — LIDOCAINE 5 % EX PTCH: 1.0000 | MEDICATED_PATCH | Freq: Two times a day (BID) | CUTANEOUS | 0 refills | Status: AC | PRN

## 2024-05-21 MED ORDER — HYDROCODONE-ACETAMINOPHEN 5-325 MG PO TABS
1.0000 | ORAL_TABLET | Freq: Once | ORAL | Status: AC
  Administered 2024-05-21: 1 via ORAL
  Filled 2024-05-21: qty 1

## 2024-05-21 MED ORDER — HYDROCODONE-ACETAMINOPHEN 5-325 MG PO TABS: 1.0000 | ORAL_TABLET | Freq: Three times a day (TID) | ORAL | 0 refills | Status: AC | PRN

## 2024-05-21 NOTE — Telephone Encounter (Signed)
 Patient called this afternoon, known to Dr. Wilhelmenia, with history of a polyposis syndrome, she did have a recent colonoscopy and was noted to have internal and external hemorrhoids. She says over the past couple of days she has been having rectal pain which she says feels like it is on the outside, this has become severe and she says she is having a hard time sitting and using rolling out of bed secondary to constant pain.  She does not feel a hemorrhoid on the outside.  She says she has not been constipated, did take.  MiraLAX , and is having bowel movements but did not change the pain.  No rectal bleeding  She actually was just arriving to [facility] ER when she called to be checked there even though she says she would rather have her care here.  Advised that was the right thing to do as she needs needs an exam as she needs someone to examine her to sort out what is causing her pain.  Advised her to call back tomorrow if she needs an appointment.  She thanked me for the call

## 2024-05-21 NOTE — ED Triage Notes (Signed)
 Pt c/o severe rectal pain with hx polyps and hemorrhoids (internal), no obvious rectal bleeding. Pt had surgery previously to reconstruct rectum due to multiple surgical procedures prior.

## 2024-05-21 NOTE — Discharge Instructions (Signed)
 Take the prescription meds as directed. Apply the Lidoderm  patches as directed. Consider sitz baths and a doughnut pillow.

## 2024-05-21 NOTE — ED Provider Notes (Signed)
 [facility] Emergency Department Provider Note     Event Date/Time   First MD Initiated Contact with Patient 05/21/24 1708     (approximate)   History   Rectal Pain   HPI  Angela Gordon is a 53 y.o. female with a history of CAP polyposis, internal and external hemorrhoids, and sickle cell trait, who presents to the ED endorsing what she believes to be rectal pain.  More specifically, she notes pain to the top of the gluteal cleft just superior to the rectal sphincter.  She denies any BRBPR, melena, or hematochezia.  She passed a normal stool without difficulty after taking some stool softener.  She does report a remote injury about 2 weeks ago when the dog tripped her, causing her to fall onto her buttocks.  Since that time, she has had pain and disability localized to the gluteal cleft, and the difficulty in finding a comfortable position, being unable to sit squarely on her buttocks.  She denies any distal paresthesias, foot drop, or saddle anesthesia.  Physical Exam   Triage Vital Signs: ED Triage Vitals  Encounter Vitals Group     BP 05/21/24 1648 (!) 130/94     Girls Systolic BP Percentile --      Girls Diastolic BP Percentile --      Boys Systolic BP Percentile --      Boys Diastolic BP Percentile --      Pulse Rate 05/21/24 1648 79     Resp 05/21/24 1648 15     Temp 05/21/24 1648 99 F (37.2 C)     Temp Source 05/21/24 1648 Oral     SpO2 05/21/24 1648 100 %     Weight 05/21/24 1649 108 lb (49 kg)     Height 05/21/24 1649 5' 2 (1.575 m)     Head Circumference --      Peak Flow --      Pain Score 05/21/24 1649 10     Pain Loc --      Pain Education --      Exclude from Growth Chart --     Most recent vital signs: Vitals:   05/21/24 1648  BP: (!) 130/94  Pulse: 79  Resp: 15  Temp: 99 F (37.2 C)  SpO2: 100%    General Awake, no distress. NAD HEENT NCAT. PERRL. EOMI. No rhinorrhea. Mucous membranes are moist. CV:  Good  peripheral perfusion.  RESP:  Normal effort.  ABD:  No distention.  Normal DRE.  No stool noted in the vault.  No engorged incarcerated or incarcerated external hemorrhoids.  No palpable internal hemorrhoids.  Tenderness to palpation to the top of the gluteal cleft.  No pointing, fluctuance, or any evidence of pilonidal cyst or sinus tract. MSK:  Normal spinal alignment without midline tenderness, spasm, deformity, step-off.  Active range of motion of all extremities.   ED Results / Procedures / Treatments   Labs (all labs ordered are listed, but only abnormal results are displayed) Labs Reviewed - No data to display   EKG   RADIOLOGY  No results found.   PROCEDURES:  Critical Care performed: No  Procedures   MEDICATIONS ORDERED IN ED: Medications  HYDROcodone-acetaminophen  (NORCO/VICODIN) 5-325 MG per tablet 1 tablet (1 tablet Oral Given 05/21/24 1817)     IMPRESSION / MDM / ASSESSMENT AND PLAN / ED COURSE  I reviewed the triage vital signs and the nursing notes.  Differential diagnosis includes, but is not limited to, pilonidal cyst abscess, external hemorrhoids, perirectal abscess, coccygeal injury, myalgias  Patient's presentation is most consistent with presentation requiring diagnostic imaging.  Patient's diagnosis is consistent with tailbone pain. Her exam and XR are normal and reassuring.  No evidence of any acute rectal inflammation, abscess, or inflamed hemorrhoid.  No radiologic evidence of any acute tailbone fracture or dislocation.  No red flags on exam.  Patient be treated for tailbone injury despite no radiologic evidence of any acute fracture or dislocation. Patient will be discharged home with prescriptions for Lidoderm  patches and Vicodin. Patient is to follow up with her PCP or GI specialist as discussed, as needed or otherwise directed. Patient is given ED precautions to return to the ED for any worsening or new  symptoms.   FINAL CLINICAL IMPRESSION(S) / ED DIAGNOSES   Final diagnoses:  Tailbone injury, initial encounter     Rx / DC Orders   ED Discharge Orders     None        Note:  This document was prepared using Dragon voice recognition software and may include unintentional dictation errors.    Loyd Candida LULLA Aldona, [name] 05/21/24 TERRY    Suzanne Kirsch, MD 05/21/24 [phone]

## 2024-05-22 ENCOUNTER — Telehealth: Payer: Self-pay

## 2024-05-22 NOTE — Telephone Encounter (Signed)
 AE, Thanks for update. Sorry to hear this.  Patty/Beth, Can you reach out to patient and find out how she is doing? Thanks. GM

## 2024-05-22 NOTE — Telephone Encounter (Signed)
 The pt states that she was told that due to a fall she may have injured her tail bone and causing her symptoms.  She was given pain meds and feels some better today.

## 2024-05-22 NOTE — Telephone Encounter (Signed)
 OTC patch?

## 2024-05-22 NOTE — Telephone Encounter (Signed)
 Copied from CRM [id number]. Topic: Clinical - Medication Question >> May 22, 2024 11:32 AM Marissa P wrote: Reason for CRM: Patient was told to call and see if a different brand she can take or if doctor can reach out to insurance because prior approval is needed please.  Medication is Lidocaine  5 percent patch (generic name) other name is lidoderm . Insurance approval required. Please advise

## 2024-05-22 NOTE — Telephone Encounter (Signed)
 See phone note dated 6/23

## 2024-05-22 NOTE — Telephone Encounter (Signed)
 Thank you Patty for update.

## 2024-05-23 NOTE — Telephone Encounter (Signed)
 Called pt upset due to call delay, I told her our policy is 24-48hrs and unfortunately Angela Gordon did respond quickly but I was out of the office for the rest of the day.

## 2024-07-27 ENCOUNTER — Other Ambulatory Visit: Payer: Self-pay | Admitting: Family Medicine

## 2024-07-27 DIAGNOSIS — M542 Cervicalgia: Secondary | ICD-10-CM

## 2024-07-27 DIAGNOSIS — G8929 Other chronic pain: Secondary | ICD-10-CM

## 2024-07-28 NOTE — Telephone Encounter (Signed)
 Requested medication (s) are due for refill today - no  Requested medication (s) are on the active medication list -yes  Future visit scheduled -no  Last refill: 05/01/24 #90 3RF  Notes to clinic: non delegated Rx  Requested Prescriptions  Pending Prescriptions Disp Refills   tiZANidine  (ZANAFLEX ) 4 MG tablet [Pharmacy Med Name: TIZANIDINE  HCL 4 MG TABLET] 270 tablet 1    Sig: TAKE 0.5-1.5 TABLETS BY MOUTH EVERY 8 (EIGHT) HOURS AS NEEDED FOR MUSCLE SPASMS (MUSCLE TIGHTNESS).     Not Delegated - Cardiovascular:  Alpha-2 Agonists - tizanidine  Failed - 07/28/2024  2:18 PM      Failed - This refill cannot be delegated      Passed - Valid encounter within last 6 months    Recent Outpatient Visits           2 months ago Nonintractable episodic headache, unspecified headache type   [facility] Leavy Mole, [name]                 Requested Prescriptions  Pending Prescriptions Disp Refills   tiZANidine  (ZANAFLEX ) 4 MG tablet [Pharmacy Med Name: TIZANIDINE  HCL 4 MG TABLET] 270 tablet 1    Sig: TAKE 0.5-1.5 TABLETS BY MOUTH EVERY 8 (EIGHT) HOURS AS NEEDED FOR MUSCLE SPASMS (MUSCLE TIGHTNESS).     Not Delegated - Cardiovascular:  Alpha-2 Agonists - tizanidine  Failed - 07/28/2024  2:18 PM      Failed - This refill cannot be delegated      Passed - Valid encounter within last 6 months    Recent Outpatient Visits           2 months ago Nonintractable episodic headache, unspecified headache type   [facility] Leavy Mole, [name]

## 2024-08-02 ENCOUNTER — Other Ambulatory Visit: Payer: Self-pay | Admitting: Family Medicine

## 2024-08-02 DIAGNOSIS — Z1231 Encounter for screening mammogram for malignant neoplasm of breast: Secondary | ICD-10-CM

## 2024-09-04 ENCOUNTER — Ambulatory Visit
Admission: RE | Admit: 2024-09-04 | Discharge: 2024-09-04 | Disposition: A | Discharge status: Home / Self Care | Source: Ambulatory Visit | Attending: Family Medicine | Admitting: Family Medicine

## 2024-09-04 DIAGNOSIS — Z1231 Encounter for screening mammogram for malignant neoplasm of breast: Secondary | ICD-10-CM | POA: Diagnosis present

## 2024-09-07 ENCOUNTER — Ambulatory Visit: Payer: Self-pay | Admitting: Family Medicine

## 2024-09-07 ENCOUNTER — Other Ambulatory Visit: Payer: Self-pay | Admitting: Family Medicine

## 2024-09-07 DIAGNOSIS — R928 Other abnormal and inconclusive findings on diagnostic imaging of breast: Secondary | ICD-10-CM

## 2024-09-11 ENCOUNTER — Ambulatory Visit
Admission: RE | Admit: 2024-09-11 | Discharge: 2024-09-11 | Disposition: A | Discharge status: Home / Self Care | Source: Ambulatory Visit | Attending: Family Medicine | Admitting: Family Medicine

## 2024-09-11 DIAGNOSIS — R928 Other abnormal and inconclusive findings on diagnostic imaging of breast: Secondary | ICD-10-CM | POA: Insufficient documentation

## 2024-09-15 ENCOUNTER — Emergency Department
Admission: EM | Admit: 2024-09-15 | Discharge: 2024-09-15 | Disposition: A | Discharge status: Home / Self Care | Attending: Emergency Medicine | Admitting: Emergency Medicine

## 2024-09-15 ENCOUNTER — Other Ambulatory Visit: Payer: Self-pay

## 2024-09-15 ENCOUNTER — Emergency Department

## 2024-09-15 DIAGNOSIS — J45909 Unspecified asthma, uncomplicated: Secondary | ICD-10-CM | POA: Diagnosis not present

## 2024-09-15 DIAGNOSIS — U071 COVID-19: Secondary | ICD-10-CM | POA: Insufficient documentation

## 2024-09-15 DIAGNOSIS — R059 Cough, unspecified: Secondary | ICD-10-CM | POA: Diagnosis present

## 2024-09-15 LAB — RESP PANEL BY RT-PCR (RSV, FLU A&B, COVID)  RVPGX2
Influenza A by PCR: NEGATIVE
Influenza B by PCR: NEGATIVE
Resp Syncytial Virus by PCR: NEGATIVE
SARS Coronavirus 2 by RT PCR: POSITIVE — AB

## 2024-09-15 MED ORDER — FLUTICASONE PROPIONATE 50 MCG/ACT NA SUSP: 1.0000 | Freq: Every day | NASAL | 0 refills | Status: AC

## 2024-09-15 MED ORDER — ALBUTEROL SULFATE HFA 108 (90 BASE) MCG/ACT IN AERS: 1.0000 | INHALATION_SPRAY | Freq: Four times a day (QID) | RESPIRATORY_TRACT | 0 refills | Status: AC | PRN

## 2024-09-15 MED ORDER — ACETAMINOPHEN 325 MG PO TABS
650.0000 mg | ORAL_TABLET | Freq: Once | ORAL | Status: AC
  Administered 2024-09-15: 650 mg via ORAL
  Filled 2024-09-15: qty 2

## 2024-09-15 NOTE — ED Provider Notes (Signed)
 [facility] Provider Note    Event Date/Time   First MD Initiated Contact with Patient 09/15/24 2107     (approximate)   History   Generalized Body Aches   HPI  RAMSIE OSTRANDER is a 53 y.o. female  with a past medical history of osteoporosis's, vitamin D  deficiency, sickle cell trait, IBD, CVA, asthma, mitral valve prolapse presents to the emergency department with myalgias, headache, sinus pressure, fatigue, cough and rhinorrhea x 5 days.  Reports she was recently in [place] the [place] and multiple people had similar symptoms there.  She did do an at-home COVID test which was positive and called an [employer] fundraiser and they told her to come here to the ED for evaluation.  Denies nausea, vomiting, abdominal pain, sore throat.  Patient has been doing Sudafed and over-the-counter tension headache medication at home.   Physical Exam   Triage Vital Signs: ED Triage Vitals  Encounter Vitals Group     BP 09/15/24 2014 (!) 136/108     Girls Systolic BP Percentile --      Girls Diastolic BP Percentile --      Boys Systolic BP Percentile --      Boys Diastolic BP Percentile --      Pulse Rate 09/15/24 2014 89     Resp 09/15/24 2014 18     Temp 09/15/24 2014 98.8 F (37.1 C)     Temp Source 09/15/24 2014 Oral     SpO2 09/15/24 2014 100 %     Weight 09/15/24 2027 110 lb (49.9 kg)     Height 09/15/24 2027 5' 2 (1.575 m)     Head Circumference --      Peak Flow --      Pain Score 09/15/24 2027 7     Pain Loc --      Pain Education --      Exclude from Growth Chart --     Most recent vital signs: Vitals:   09/15/24 2014 09/15/24 2136  BP: (!) 136/108 134/85  Pulse: 89 73  Resp: 18 18  Temp: 98.8 F (37.1 C) 98.7 F (37.1 C)  SpO2: 100% 100%    General: Awake, in no acute distress. Appears stated age. Eyes: No scleral icterus or conjunctival injection. Ears/Nose/Throat: TMs intact b/l. Nares patent, no nasal discharge. Oropharynx moist, no erythema or  exudate. Dentition intact. Neck: Supple, no lymphadenopathy, no nuchal rigidity. CV: Good peripheral perfusion. No edema.  Respiratory:Normal respiratory effort.  No respiratory distress. CTAB. GI: Soft, non-distended, non-tender.  Skin:Warm, dry, intact. No rashes, lesions, or ecchymosis. No cyanosis or pallor.  ED Results / Procedures / Treatments   Labs (all labs ordered are listed, but only abnormal results are displayed) Labs Reviewed  RESP PANEL BY RT-PCR (RSV, FLU A&B, COVID)  RVPGX2 - Abnormal; Notable for the following components:      Result Value   SARS Coronavirus 2 by RT PCR POSITIVE (*)    All other components within normal limits     EKG     RADIOLOGY CXR ordered.   FINDINGS:   LUNGS AND PLEURA: No focal pulmonary opacity. No pulmonary edema. No pleural effusion. No pneumothorax.   HEART AND MEDIASTINUM: No acute abnormality of the cardiac and mediastinal silhouettes.   BONES AND SOFT TISSUES: No acute osseous abnormality.   IMPRESSION: 1. Normal chest radiograph.   PROCEDURES:  Critical Care performed: No   Procedures   MEDICATIONS ORDERED IN ED: Medications  acetaminophen  (  TYLENOL ) tablet 650 mg (650 mg Oral Given 09/15/24 2136)     IMPRESSION / MDM / ASSESSMENT AND PLAN / ED COURSE  I reviewed the triage vital signs and the nursing notes.                              Differential diagnosis includes, but is not limited to, COVID, influenza, RSV  Patient's presentation is most consistent with acute complicated illness / injury requiring diagnostic workup.  Patient is a 53 year old female signs and symptoms as described above.  Patient is well-appearing, afebrile and physical exam is reassuring.  Hemodynamically stable.  Respiratory panel is positive for COVID.  Chest x-ray was ordered in triage. I independently viewed the x-ray and radiologist's report.  I agree with the radiologist's report that there are no acute findings.  Provided  prescriptions for Tessalon Perles, albuterol  inhaler refill for her asthma, and Flonase.  Discussed use of Tylenol  at home for pain and fever.  Work note provided.  Instructed to follow-up with her primary care provider if anything worsens.  The patient may return to the emergency department for any new, worsening, or concerning symptoms. Patient was given the opportunity to ask questions; all questions were answered. Emergency department return precautions were discussed with the patient.  Patient is in agreement to the treatment plan.  Patient is stable for discharge.    FINAL CLINICAL IMPRESSION(S) / ED DIAGNOSES   Final diagnoses:  COVID-19     Rx / DC Orders   ED Discharge Orders          Ordered    benzonatate (TESSALON PERLES) 100 MG capsule  3 times daily PRN        09/15/24 2233    albuterol  (VENTOLIN  HFA) 108 (90 Base) MCG/ACT inhaler  Every 6 hours PRN        09/15/24 2233    fluticasone (FLONASE) 50 MCG/ACT nasal spray  Daily        09/15/24 2233             Note:  This document was prepared using Dragon voice recognition software and may include unintentional dictation errors.     Sheron Salm, [name] 09/15/24 2303    Jacolyn Pae, MD 09/15/24 2356

## 2024-09-15 NOTE — Discharge Instructions (Addendum)
 As we discussed, although you have tested positive for COVID-19 (coronavirus), you do not need to be hospitalized at this time.  Read through all the included information including the recommendations from the CDC.  We recommend that you self-quarantine at home with your immediate family only (people with whom you have already been in contact) for about a week after your fever has gone away (without taking medication to make your temperature come down, such as Tylenol  (acetaminophen )), after your respiratory symptoms have improved.  You should have as minimal contact as possible with anyone else including close family as per the [facility] paperwork guidelines listed below. Follow-up with your doctor by phone or online as needed and return immediately to the emergency department or call 911 only if you develop new or worsening symptoms that concern you.  If you were prescribed any medications, please use them as instructed.  You can find up-to-date information about COVID-19 in [place]  by calling the [place]  Coronavirus Helpline: 7574973010. You may also call 2-1-1, or [phone], or additional resources.  You can also find information online at PureLoser.gl, or on the Center for Disease Control (CDC) website at http://bradshaw.com/.

## 2024-09-15 NOTE — ED Triage Notes (Signed)
 Patient brought in by husband for body aches, headache, sinus pressure, fatigue, cough, nasal discharge x4 days. AxOx4, ambulatory with steady gait.

## 2024-09-21 ENCOUNTER — Ambulatory Visit (INDEPENDENT_AMBULATORY_CARE_PROVIDER_SITE_OTHER): Admitting: Nurse Practitioner

## 2024-09-21 ENCOUNTER — Ambulatory Visit: Payer: Self-pay

## 2024-09-21 ENCOUNTER — Encounter: Payer: Self-pay | Admitting: Nurse Practitioner

## 2024-09-21 VITALS — BP 116/78 | HR 61 | Temp 98.2°F | Resp 18 | Ht 62.0 in | Wt 106.3 lb

## 2024-09-21 DIAGNOSIS — U071 COVID-19: Secondary | ICD-10-CM

## 2024-09-21 DIAGNOSIS — R0602 Shortness of breath: Secondary | ICD-10-CM | POA: Diagnosis not present

## 2024-09-21 NOTE — Telephone Encounter (Signed)
 FYI Only or Action Required?: FYI only for provider.  Patient was last seen in primary care on 05/01/2024 by Tapia, Leisa, [name].  Called Nurse Triage reporting Covid Positive.  Symptoms began a week ago.  Interventions attempted: OTC medications: mucinex.  Symptoms are: gradually worsening.  Triage Disposition: See HCP Within 4 Hours (Or PCP Triage)  Patient/caregiver understands and will follow disposition?: Yes    Copied from CRM [id number]. Topic: Clinical - Red Word Triage >> Sep 21, 2024  9:26 AM Angela Gordon wrote: [facility] that prompted transfer to Nurse Triage: chest hurts, takes her breath away, coughing phlegm.. unsure if its apart of it?  These are new symptoms.. was in the ER 17th..Off on and on headache since 17th.. ( only if she moves around too much ) Reason for Disposition  MILD difficulty breathing (e.Gordon., minimal/no SOB at rest, SOB with walking, pulse < 100)  Answer Assessment - Initial Assessment Questions Pt is having worsening symptoms. Cold air takes her breath away, Shortness of breath walking in her house. Cough with phlegm.     1. SYMPTOMS: What is your main symptom or concern? (e.Gordon., cough, fever, shortness of breath, muscle aches)     Cough with phlegm and cold air takes breath away.  2. ONSET: When did the symptoms start?      Prior to friday 3. COUGH: Do you have a cough? If Yes, ask: How bad is the cough?       yes 4. FEVER: Do you have a fever? If Yes, ask: What is your temperature, how was it measured, and when did it start?     no 5. BREATHING DIFFICULTY: Are you having any difficulty breathing? (e.Gordon., normal; shortness of breath, wheezing, unable to speak)      Short of breath going up and down stairs 6. BETTER-SAME-WORSE: Are you getting better, staying the same or getting worse compared to yesterday?  If getting worse, ask, In what way?     Worse than Friday 7. OTHER SYMPTOMS: Do you have any other symptoms?  (e.Gordon., chills,  fatigue, headache, loss of smell or taste, muscle pain, sore throat)     Fatigue, headache, cough with phlegm and now shortness of breath 8. COVID-19 DIAGNOSIS: How do you know that you have COVID? (e.Gordon., positive lab test or self-test, diagnosed by doctor or NP/PA, symptoms after exposure).     In the Er 9. COVID-19 EXPOSURE: Was there any known exposure to COVID before the symptoms began?      unknown  11. HIGH RISK DISEASE: Do you have any chronic medical problems? (e.Gordon., asthma, heart or lung disease, weak immune system, obesity, etc.)       Sickle cell, asthma, CVA  Protocols used: COVID-19 - Diagnosed or Suspected-A-AH

## 2024-09-21 NOTE — Progress Notes (Signed)
 BP 116/78   Pulse 61   Temp 98.2 F (36.8 C)   Resp 18   Ht 5' 2 (1.575 m)   Wt 106 lb 4.8 oz (48.2 kg)   SpO2 99%   BMI 19.44 kg/m    Subjective:    Patient ID: Angela Gordon, female    DOB: 01-19-1971, 53 y.o.   MRN: [id number]  HPI: Angela Gordon is a 53 y.o. female presenting today for an ER follow-up after being evaluated at [facility] Emergency Department on 09/15/2024 for COVID-19 with symptoms of myalgias, headache, sinus pressure, cough, and rhinorrhea for five days. Respiratory panel PCR was positive for SARS Coronavirus 2. Chest radiograph was normal, showing no acute findings. She was sent home on tessalon perles 100 mg capsules 3 times daily as needed for cough, Albuterol  inhaler every 6 hours as needed, and Flonase nasal spray daily. She is following up today because she has felt  short of breath over the last few days with exertion and when lying on her back. Denies chest pain at rest, but reports chest tightness with cough and going out into the cold weather. She is still coughing and endorses it being more productive in the morning and then becomes more dry throughout the day. She is taking tessalon perles and doing her albuterol  inhaler which has provided relief. She denies fever, but still endorses fatigue and lack of energy.     09/15/2024 ED Labs: Labs (all labs ordered are listed, but only abnormal results are displayed)      Labs Reviewed  RESP PANEL BY RT-PCR (RSV, FLU A&B, COVID)  RVPGX2 - Abnormal; Notable for the following components:      Result Value     SARS Coronavirus 2 by RT PCR POSITIVE (*)      All other components within normal limits    09/15/2024 ED Imaging: Chest X-ray   EXAM:  2 VIEW(S) XRAY OF THE CHEST  09/15/2024 08:49:00 PM   COMPARISON:  None available.   CLINICAL HISTORY:  cough. Patient brought in by husband for body aches, headache, sinus pressure,  fatigue, cough, nasal discharge x4 days. AxOx4, ambulatory with steady  gait.   FINDINGS:   LUNGS AND PLEURA:  No focal pulmonary opacity. No pulmonary edema. No pleural effusion. No  pneumothorax.   HEART AND MEDIASTINUM:  No acute abnormality of the cardiac and mediastinal silhouettes.   BONES AND SOFT TISSUES:  No acute osseous abnormality.   IMPRESSION:  1. Normal chest radiograph.   Electronically signed by: Norman Gatlin MD 09/15/2024 09:28 PM EDT RP  Workstation: [id number]   09/15/2024 ED Assessment and Plan:  IMPRESSION / MDM / ASSESSMENT AND PLAN / ED COURSE  I reviewed the triage vital signs and the nursing notes.                               Differential diagnosis includes, but is not limited to, COVID, influenza, RSV   Patient's presentation is most consistent with acute complicated illness / injury requiring diagnostic workup.   Patient is a 53 year old female signs and symptoms as described above.  Patient is well-appearing, afebrile and physical exam is reassuring.  Hemodynamically stable.  Respiratory panel is positive for COVID.  Chest x-ray was ordered in triage. I independently viewed the x-ray and radiologist's report.  I agree with the radiologist's report that there are no acute findings.  Provided prescriptions for [employer],  albuterol  inhaler refill for her asthma, and Flonase.  Discussed use of Tylenol  at home for pain and fever.  Work note provided.  Instructed to follow-up with her primary care provider if anything worsens.   The patient may return to the emergency department for any new, worsening, or concerning symptoms. Patient was given the opportunity to ask questions; all questions were answered. Emergency department return precautions were discussed with the patient.  Patient is in agreement to the treatment plan.  Patient is stable for discharge.    05/01/2024    2:27 PM 04/02/2023    1:34 PM 12/17/2022    9:18 AM  Depression screen PHQ 2/9  Decreased Interest 3 0 0  Down, Depressed, Hopeless 2 0 0  PHQ - 2  Score 5 0 0  Altered sleeping 2 0   Tired, decreased energy 3 0   Change in appetite 3 0   Feeling bad or failure about yourself  3 0   Trouble concentrating 2 0   Moving slowly or fidgety/restless 0 0   Suicidal thoughts 0 0   PHQ-9 Score 18 0   Difficult doing work/chores  Not difficult at all     Relevant past medical, surgical, family and social history reviewed and updated as indicated. Interim medical history since our last visit reviewed. Allergies and medications reviewed and updated.  Review of Systems  Ten systems reviewed and is negative except as mentioned in HPI      Objective:     BP 116/78   Pulse 61   Temp 98.2 F (36.8 C)   Resp 18   Ht 5' 2 (1.575 m)   Wt 106 lb 4.8 oz (48.2 kg)   SpO2 99%   BMI 19.44 kg/m    Wt Readings from Last 3 Encounters:  09/21/24 106 lb 4.8 oz (48.2 kg)  09/15/24 110 lb (49.9 kg)  05/21/24 108 lb (49 kg)    Physical Exam Constitutional:      Appearance: Normal appearance.  HENT:     Head: Normocephalic and atraumatic.     Nose: Nose normal.  Eyes:     Pupils: Pupils are equal, round, and reactive to light.  Cardiovascular:     Rate and Rhythm: Normal rate and regular rhythm.     Pulses: Normal pulses.     Heart sounds: Normal heart sounds.  Pulmonary:     Effort: Pulmonary effort is normal.     Breath sounds: Normal breath sounds.  Musculoskeletal:        General: Normal range of motion.     Cervical back: Normal range of motion and neck supple.  Skin:    General: Skin is warm and dry.  Neurological:     General: No focal deficit present.     Mental Status: She is alert and oriented to person, place, and time.  Psychiatric:        Mood and Affect: Mood normal.        Behavior: Behavior normal.        Thought Content: Thought content normal.        Judgment: Judgment normal.      Results for orders placed or performed during the hospital encounter of 09/15/24  Resp panel by RT-PCR (RSV, Flu A&B, Covid)  Anterior Nasal Swab   Collection Time: 09/15/24  8:29 PM   Specimen: Anterior Nasal Swab  Result Value Ref Range   SARS Coronavirus 2 by RT PCR POSITIVE (A) NEGATIVE   Influenza  A by PCR NEGATIVE NEGATIVE   Influenza B by PCR NEGATIVE NEGATIVE   Resp Syncytial Virus by PCR NEGATIVE NEGATIVE          Assessment & Plan:   Problem List Items Addressed This Visit   None Visit Diagnoses       COVID-19    -  Primary   EKG unremarkable. Continue with albuterol  inhaler q6 hours as needed and tessalon perles for cough. Advised rest and hydration.   Relevant Orders   EKG 12-Lead     Shortness of breath       EKG unremarkable. Continue with Albuterol  inhaler q6 hours as needed.   Relevant Orders   EKG 12-Lead     -SPO2 was 99% today with RR of 18. EKG was unremarkable. Other vital signs normal. Plan to continue albuterol  inhaler every 6 hours as needed, take tessalon perles 100 mg 3 times a day as needed for cough, and flonase daily. Encouraged plenty of rest and hydration. She denies needing any refills on medications.Discussed that fatigue and lack of energy are common with COVID-19 and may take a few weeks to feel back to normal self. Advised taking vitamin D , vitamin C and Zinc. Discussed that if shortness of breath worsens along with chest pain then to report to the emergency room for evaluation.          Follow up plan: Return if symptoms worsen or fail to improve.   I have reviewed this encounter including the documentation in this note and/or discussed this patient with the provider, Aislinn Womack, SNP, I am certifying that I agree with the content of this note as supervising/preceptor nurse practitioner.  Mliss Spray, [name]-C [facility] 09/21/2024, 12:41 PM

## 2024-09-29 ENCOUNTER — Encounter: Payer: Self-pay | Admitting: Gastroenterology

## 2024-09-29 ENCOUNTER — Encounter: Payer: Self-pay | Admitting: Nurse Practitioner
